# Patient Record
Sex: Female | Born: 1945 | Race: Black or African American | Hispanic: No | Marital: Married | Smoking: Former smoker
Health system: Southern US, Community
[De-identification: ages and names within clinical notes are randomized; demographics above are authoritative.]

## PROBLEM LIST (undated history)

## (undated) DIAGNOSIS — E785 Hyperlipidemia, unspecified: Secondary | ICD-10-CM

## (undated) DIAGNOSIS — I10 Essential (primary) hypertension: Secondary | ICD-10-CM

## (undated) DIAGNOSIS — R918 Other nonspecific abnormal finding of lung field: Secondary | ICD-10-CM

## (undated) HISTORY — DX: Essential (primary) hypertension: I10

## (undated) HISTORY — DX: Hyperlipidemia, unspecified: E78.5

## (undated) HISTORY — PX: VAGINAL HYSTERECTOMY: SUR661

---

## 1998-02-08 ENCOUNTER — Ambulatory Visit (HOSPITAL_COMMUNITY): Admission: RE | Admit: 1998-02-08 | Discharge: 1998-02-08 | Payer: Self-pay | Admitting: Family Medicine

## 1998-02-08 ENCOUNTER — Encounter: Payer: Self-pay | Admitting: Family Medicine

## 1999-02-27 ENCOUNTER — Other Ambulatory Visit: Admission: RE | Admit: 1999-02-27 | Discharge: 1999-02-27 | Payer: Self-pay | Admitting: Family Medicine

## 1999-03-08 ENCOUNTER — Ambulatory Visit (HOSPITAL_COMMUNITY): Admission: RE | Admit: 1999-03-08 | Discharge: 1999-03-08 | Payer: Self-pay | Admitting: Family Medicine

## 1999-03-08 ENCOUNTER — Encounter: Payer: Self-pay | Admitting: Family Medicine

## 2000-02-11 ENCOUNTER — Encounter: Payer: Self-pay | Admitting: Family Medicine

## 2000-02-11 ENCOUNTER — Encounter: Admission: RE | Admit: 2000-02-11 | Discharge: 2000-02-11 | Payer: Self-pay | Admitting: Family Medicine

## 2001-02-10 ENCOUNTER — Encounter: Admission: RE | Admit: 2001-02-10 | Discharge: 2001-02-10 | Payer: Self-pay | Admitting: Family Medicine

## 2001-02-10 ENCOUNTER — Encounter: Payer: Self-pay | Admitting: Family Medicine

## 2002-02-16 ENCOUNTER — Encounter: Payer: Self-pay | Admitting: Family Medicine

## 2002-02-16 ENCOUNTER — Encounter: Admission: RE | Admit: 2002-02-16 | Discharge: 2002-02-16 | Payer: Self-pay | Admitting: Family Medicine

## 2002-04-06 ENCOUNTER — Encounter: Admission: RE | Admit: 2002-04-06 | Discharge: 2002-05-10 | Payer: Self-pay | Admitting: Occupational Medicine

## 2002-06-08 ENCOUNTER — Encounter: Admission: RE | Admit: 2002-06-08 | Discharge: 2002-09-06 | Payer: Self-pay | Admitting: Orthopedic Surgery

## 2002-12-28 ENCOUNTER — Ambulatory Visit (HOSPITAL_COMMUNITY): Admission: RE | Admit: 2002-12-28 | Discharge: 2002-12-28 | Payer: Self-pay | Admitting: Orthopedic Surgery

## 2002-12-28 ENCOUNTER — Encounter: Payer: Self-pay | Admitting: Orthopedic Surgery

## 2003-02-18 ENCOUNTER — Encounter: Payer: Self-pay | Admitting: Family Medicine

## 2003-02-18 ENCOUNTER — Encounter: Admission: RE | Admit: 2003-02-18 | Discharge: 2003-02-18 | Payer: Self-pay | Admitting: Family Medicine

## 2003-03-17 ENCOUNTER — Encounter: Payer: Self-pay | Admitting: Orthopedic Surgery

## 2003-03-24 ENCOUNTER — Ambulatory Visit (HOSPITAL_COMMUNITY): Admission: RE | Admit: 2003-03-24 | Discharge: 2003-03-24 | Payer: Self-pay | Admitting: Orthopedic Surgery

## 2003-05-06 ENCOUNTER — Encounter: Admission: RE | Admit: 2003-05-06 | Discharge: 2003-05-19 | Payer: Self-pay | Admitting: Orthopedic Surgery

## 2004-01-10 ENCOUNTER — Encounter: Admission: RE | Admit: 2004-01-10 | Discharge: 2004-01-10 | Payer: Self-pay | Admitting: Family Medicine

## 2004-02-20 ENCOUNTER — Encounter: Admission: RE | Admit: 2004-02-20 | Discharge: 2004-02-20 | Payer: Self-pay | Admitting: Family Medicine

## 2004-02-28 ENCOUNTER — Other Ambulatory Visit: Admission: RE | Admit: 2004-02-28 | Discharge: 2004-02-28 | Payer: Self-pay | Admitting: Family Medicine

## 2004-02-29 ENCOUNTER — Ambulatory Visit (HOSPITAL_COMMUNITY): Admission: RE | Admit: 2004-02-29 | Discharge: 2004-02-29 | Payer: Self-pay | Admitting: Family Medicine

## 2004-04-13 ENCOUNTER — Encounter (INDEPENDENT_AMBULATORY_CARE_PROVIDER_SITE_OTHER): Payer: Self-pay | Admitting: *Deleted

## 2004-04-13 ENCOUNTER — Ambulatory Visit (HOSPITAL_COMMUNITY): Admission: RE | Admit: 2004-04-13 | Discharge: 2004-04-13 | Payer: Self-pay | Admitting: Gastroenterology

## 2005-02-21 ENCOUNTER — Encounter: Admission: RE | Admit: 2005-02-21 | Discharge: 2005-02-21 | Payer: Self-pay | Admitting: Family Medicine

## 2006-02-25 ENCOUNTER — Encounter: Admission: RE | Admit: 2006-02-25 | Discharge: 2006-02-25 | Payer: Self-pay | Admitting: Family Medicine

## 2006-03-04 ENCOUNTER — Other Ambulatory Visit: Admission: RE | Admit: 2006-03-04 | Discharge: 2006-03-04 | Payer: Self-pay | Admitting: Family Medicine

## 2007-02-27 ENCOUNTER — Encounter: Admission: RE | Admit: 2007-02-27 | Discharge: 2007-02-27 | Payer: Self-pay | Admitting: Family Medicine

## 2008-02-29 ENCOUNTER — Encounter: Admission: RE | Admit: 2008-02-29 | Discharge: 2008-02-29 | Payer: Self-pay | Admitting: Family Medicine

## 2008-03-08 ENCOUNTER — Other Ambulatory Visit: Admission: RE | Admit: 2008-03-08 | Discharge: 2008-03-08 | Payer: Self-pay | Admitting: Family Medicine

## 2008-03-18 ENCOUNTER — Encounter: Admission: RE | Admit: 2008-03-18 | Discharge: 2008-03-18 | Payer: Self-pay | Admitting: Family Medicine

## 2009-03-01 ENCOUNTER — Encounter: Admission: RE | Admit: 2009-03-01 | Discharge: 2009-03-01 | Payer: Self-pay | Admitting: Family Medicine

## 2009-05-04 ENCOUNTER — Other Ambulatory Visit: Admission: RE | Admit: 2009-05-04 | Discharge: 2009-05-04 | Payer: Self-pay | Admitting: Family Medicine

## 2010-03-02 ENCOUNTER — Encounter: Admission: RE | Admit: 2010-03-02 | Discharge: 2010-03-02 | Payer: Self-pay | Admitting: Family Medicine

## 2010-06-17 ENCOUNTER — Encounter: Payer: Self-pay | Admitting: Endocrinology

## 2010-10-12 NOTE — Op Note (Signed)
Yolanda Robertson, Yolanda Robertson           ACCOUNT NO.:  [id number]   MEDICAL RECORD NO.:  [id number]          PATIENT TYPE:  AMB   LOCATION:  ENDO                         FACILITY:  [facility]   PHYSICIAN:  Anselmo Rod, M.D.  DATE OF BIRTH:  28-Sep-1945   DATE OF PROCEDURE:  04/13/2004  DATE OF DISCHARGE:                                 OPERATIVE REPORT   PROCEDURE PERFORMED:  Colonoscopy with snare polypectomy x4 and cold  biopsies x4.   ENDOSCOPIST:  Anselmo Rod, M.D.   INSTRUMENT USED:  Olympus video colonoscope.   INDICATION FOR PROCEDURE:  A 65 year old African-American female undergoing  a screening colonoscopy to rule out colonic polyps, masses, etc.   PREPROCEDURE PHYSICAL:  Informed consent was procured from the patient.  The  patient was fasted for eight hours prior to the procedure and prepped with a  bottle of magnesium citrate and a gallon of GoLYTELY the night prior to the  procedure.  The risks and benefits of the procedure, including a 10% miss  rate of colon polyps and cancers, was discussed with the patient as well.   PREPROCEDURE PHYSICAL:  VITAL SIGNS:  The patient had stable vital signs.  NECK:  Supple.  CHEST:  Clear to auscultation.  S1, S2 regular.  ABDOMEN:  Soft with normal bowel sounds.   DESCRIPTION OF PROCEDURE:  The patient was placed in the left lateral  decubitus position and sedated with 75 mg of Demerol and 8 mg of Versed in  slow incremental doses.  Once the patient was adequately sedate and  maintained on low-flow oxygen and continuous cardiac monitoring, the Olympus  video colonoscope was advanced from the rectum to the cecum.  The  appendiceal orifice and ileocecal valve were clearly visualized and  photographed.  Four small sessile polyps were cold biopsied from the  rectosigmoid colon and four flat polyps were removed by snare polypectomy.  The rest of the exam was unrevealing.  Retroflexion in th rectum revealed no  abnormalities.   IMPRESSION:  1.  Four small sessile polyps cold biopsied from rectosigmoid colon and four      polyps (flat polyps) snared from this area as well.  2.  No other masses or polyps seen, no evidence of diverticulosis.   RECOMMENDATIONS:  1.  Await pathology results.  2.  Avoid nonsteroidals, including aspirin, for the next four weeks.  3.  Outpatient follow-up in the next two weeks or earlier if need be.       JNM/MEDQ  D:  04/13/2004  T:  04/14/2004  Job:  [id number]   cc:   [name], M.D.  [phone] [address], [address]  [place]  [place] [address]  Fax: [phone]

## 2010-10-12 NOTE — Op Note (Signed)
NAME:  Yolanda Robertson, Yolanda Robertson                     ACCOUNT NO.:  [id number]   MEDICAL RECORD NO.:  [id number]                   PATIENT TYPE:  AMB   LOCATION:  DAY                                  [facility]ACILITY:  [facility]   PHYSICIAN:  Dionne Ano. Everlene Other, M.D.         DATE OF BIRTH:  1946/04/01   DATE OF PROCEDURE:  03/24/2003  DATE OF DISCHARGE:                                 OPERATIVE REPORT   PREOPERATIVE DIAGNOSES:  Painful left carpometacarpal boss with  tenosynovitis about the extensor carpi radialis longus tendon and minor boss  which is minimally symptomatic about the third carpometacarpal region.   POSTOPERATIVE DIAGNOSES:  Painful left carpometacarpal boss with  tenosynovitis about the extensor carpi radialis longus tendon and minor boss  which is minimally symptomatic about the third carpometacarpal region.   PROCEDURE:  1. Right second CMC boss removal (debridement of bony spur CMC     joint/removal).  2. Extensor carpi radialis longus tenosynovectomy at the wrist level.   SURGEON:  Dionne Ano. Amanda Pea, M.D.   ASSISTANT:  Karie Chimera, P.A.-C.   COMPLICATIONS:  None.   ANESTHESIA:  Axillary block with IV sedation.   TOURNIQUET TIME:  Less than an hour.   ESTIMATED BLOOD LOSS:  Minimal.   DRAINS:  None.   INDICATIONS FOR PROCEDURE:  This patient is a very pleasant 65 year old  female whose had a history of de Quervain tenosynovitis as well as other  hand complaints including the above mentioned CMC painful boss. I have  counselled her in regards to the risks and benefits of surgery including  risk of infection, bleeding, anesthesia, damage to normal structures and  failure of the surgery to accomplish its intended goals of relieving  symptoms and restoring function. with this in mind, she desires to proceed.  She has failed conservative management and desires to proceed. I have  discussed with her all do's and don't's and I do feel this is related to her  work. We  have discussed these issues at length.   FINDINGS:  The patient had a very large CMC boss impinging against the ECRL  tendon. She underwent a tenosynovectomy of the ECRL tendon and bony boss  removal without difficulty. The ECRB tendon was identified and did not have  significant tenosynovitic changes.   DESCRIPTION OF PROCEDURE:  The patient was seen by myself and anesthesia,  taken to the operative suite. Axillary block was noted to be in excellent  working fashion. She was then given a light IV sedation. She was laid  supine, appropriately padded, prepped and draped in the usual sterile  fashion with Betadine scrub and paint. I performed an evaluation under  anesthesia, she had excellent stability. There was no obvious instability  noted. Once this was done and a sterile field was secured, a transverse  incision was made under 250 mm of tourniquet control at the level of the [facility]  region. Once the incision was made, I bluntly  dissected after the initial  skin incision to the second [facility] region. There was a dorsal crossing fascial  bundle and I did sweep the dorsal sensory branches radially. I dissected  down to the [facility] boss region, incised the periosteal tissue. At this time, I  identified the ECRL tendon and performed a tenosynovectomy where there was a  large amount of tenosynovitis secondary to its rubbing over the boss. I then  gently swept this out of the way, identified the boss circumferentially and  removed it with osteotome rongeur and filed this down with a file to a nice  smooth surface. I then placed bone wax on it. I did inspect the [facility] joint  where the trapezoid and second metacarpal met each other and this showed no  significant arthritic changes in the deep portion. With this noted, I then  irrigated the area copiously. Following this, I looked at the ECRB tendon at  its insertion onto the third [facility] region and there was no advanced  tenosynovitic changes and this  correlated with her preoperative examination  thus at this time I did not perform any aggressive third CMC take down.  Following this, I deflated the tourniquet, obtained hemostasis with bipolar  electrocautery, irrigated the wound copiously and then closed it with one  interrupted Vicryl followed by a subcuticular Prolene suture, Steri-Strips  and gauze followed by a volar plaster splint with the wrist in full  extension. She tolerated the procedure well, there were no complicating  features. All sponge, needle and instrument counts were reported as correct.  She will be transferred to the recovery room, discharged home on appropriate  pain medicines of  Percocet, Robaxin and Colace. Will see her back in the  office in 8-10 days for suture removal and proceed with her postoperative  protocol including casting for up to four weeks postop. She did quite well  with her surgery and I was pleased with all findings. There were no  complicating features with her surgery.                                               Dionne Ano. Everlene Other, M.D.    Nash Mantis  D:  03/24/2003  T:  03/24/2003  Job:  [id number]

## 2011-01-30 ENCOUNTER — Other Ambulatory Visit: Payer: Self-pay | Admitting: Family Medicine

## 2011-01-30 DIAGNOSIS — Z1231 Encounter for screening mammogram for malignant neoplasm of breast: Secondary | ICD-10-CM

## 2011-03-04 ENCOUNTER — Ambulatory Visit
Admission: RE | Admit: 2011-03-04 | Discharge: 2011-03-04 | Disposition: A | Discharge status: Home / Self Care | Payer: 59 | Source: Ambulatory Visit | Attending: Family Medicine | Admitting: Family Medicine

## 2011-03-04 DIAGNOSIS — Z1231 Encounter for screening mammogram for malignant neoplasm of breast: Secondary | ICD-10-CM

## 2012-02-03 ENCOUNTER — Other Ambulatory Visit: Payer: Self-pay | Admitting: Family Medicine

## 2012-02-03 DIAGNOSIS — Z1231 Encounter for screening mammogram for malignant neoplasm of breast: Secondary | ICD-10-CM

## 2012-03-05 ENCOUNTER — Ambulatory Visit
Admission: RE | Admit: 2012-03-05 | Discharge: 2012-03-05 | Disposition: A | Discharge status: Home / Self Care | Payer: Medicare Other | Source: Ambulatory Visit | Attending: Family Medicine | Admitting: Family Medicine

## 2012-03-05 DIAGNOSIS — Z1231 Encounter for screening mammogram for malignant neoplasm of breast: Secondary | ICD-10-CM

## 2013-02-01 ENCOUNTER — Other Ambulatory Visit: Payer: Self-pay

## 2013-02-01 DIAGNOSIS — Z1231 Encounter for screening mammogram for malignant neoplasm of breast: Secondary | ICD-10-CM

## 2013-03-08 ENCOUNTER — Ambulatory Visit
Admission: RE | Admit: 2013-03-08 | Discharge: 2013-03-08 | Disposition: A | Discharge status: Home / Self Care | Payer: Medicare Other | Source: Ambulatory Visit

## 2013-03-08 DIAGNOSIS — Z1231 Encounter for screening mammogram for malignant neoplasm of breast: Secondary | ICD-10-CM

## 2014-02-02 ENCOUNTER — Other Ambulatory Visit: Payer: Self-pay

## 2014-02-02 DIAGNOSIS — Z1231 Encounter for screening mammogram for malignant neoplasm of breast: Secondary | ICD-10-CM

## 2014-03-09 ENCOUNTER — Encounter (INDEPENDENT_AMBULATORY_CARE_PROVIDER_SITE_OTHER): Payer: Self-pay

## 2014-03-09 ENCOUNTER — Ambulatory Visit
Admission: RE | Admit: 2014-03-09 | Discharge: 2014-03-09 | Disposition: A | Discharge status: Home / Self Care | Payer: Commercial Managed Care - HMO | Source: Ambulatory Visit

## 2014-03-09 DIAGNOSIS — Z1231 Encounter for screening mammogram for malignant neoplasm of breast: Secondary | ICD-10-CM

## 2014-07-20 DIAGNOSIS — E1100 Type 2 diabetes mellitus with hyperosmolarity without nonketotic hyperglycemic-hyperosmolar coma (NKHHC): Secondary | ICD-10-CM | POA: Diagnosis not present

## 2014-07-20 DIAGNOSIS — I10 Essential (primary) hypertension: Secondary | ICD-10-CM | POA: Diagnosis not present

## 2014-07-26 DIAGNOSIS — I10 Essential (primary) hypertension: Secondary | ICD-10-CM | POA: Diagnosis not present

## 2014-07-26 DIAGNOSIS — E782 Mixed hyperlipidemia: Secondary | ICD-10-CM | POA: Diagnosis not present

## 2014-07-26 DIAGNOSIS — E0800 Diabetes mellitus due to underlying condition with hyperosmolarity without nonketotic hyperglycemic-hyperosmolar coma (NKHHC): Secondary | ICD-10-CM | POA: Diagnosis not present

## 2014-12-27 DIAGNOSIS — M130 Polyarthritis, unspecified: Secondary | ICD-10-CM | POA: Diagnosis not present

## 2014-12-27 DIAGNOSIS — E782 Mixed hyperlipidemia: Secondary | ICD-10-CM | POA: Diagnosis not present

## 2014-12-27 DIAGNOSIS — I10 Essential (primary) hypertension: Secondary | ICD-10-CM | POA: Diagnosis not present

## 2014-12-27 DIAGNOSIS — E0800 Diabetes mellitus due to underlying condition with hyperosmolarity without nonketotic hyperglycemic-hyperosmolar coma (NKHHC): Secondary | ICD-10-CM | POA: Diagnosis not present

## 2015-02-09 ENCOUNTER — Other Ambulatory Visit: Payer: Self-pay

## 2015-02-09 DIAGNOSIS — Z1231 Encounter for screening mammogram for malignant neoplasm of breast: Secondary | ICD-10-CM

## 2015-03-15 ENCOUNTER — Ambulatory Visit
Admission: RE | Admit: 2015-03-15 | Discharge: 2015-03-15 | Disposition: A | Discharge status: Home / Self Care | Payer: Commercial Managed Care - HMO | Source: Ambulatory Visit

## 2015-03-15 DIAGNOSIS — Z1231 Encounter for screening mammogram for malignant neoplasm of breast: Secondary | ICD-10-CM

## 2015-05-17 DIAGNOSIS — E118 Type 2 diabetes mellitus with unspecified complications: Secondary | ICD-10-CM | POA: Diagnosis not present

## 2015-05-17 DIAGNOSIS — I10 Essential (primary) hypertension: Secondary | ICD-10-CM | POA: Diagnosis not present

## 2015-05-17 DIAGNOSIS — E782 Mixed hyperlipidemia: Secondary | ICD-10-CM | POA: Diagnosis not present

## 2015-05-18 DIAGNOSIS — M130 Polyarthritis, unspecified: Secondary | ICD-10-CM | POA: Diagnosis not present

## 2015-05-18 DIAGNOSIS — I10 Essential (primary) hypertension: Secondary | ICD-10-CM | POA: Diagnosis not present

## 2015-05-18 DIAGNOSIS — E0800 Diabetes mellitus due to underlying condition with hyperosmolarity without nonketotic hyperglycemic-hyperosmolar coma (NKHHC): Secondary | ICD-10-CM | POA: Diagnosis not present

## 2015-09-15 DIAGNOSIS — I10 Essential (primary) hypertension: Secondary | ICD-10-CM | POA: Diagnosis not present

## 2015-09-15 DIAGNOSIS — E089 Diabetes mellitus due to underlying condition without complications: Secondary | ICD-10-CM | POA: Diagnosis not present

## 2015-09-15 DIAGNOSIS — R7309 Other abnormal glucose: Secondary | ICD-10-CM | POA: Diagnosis not present

## 2016-01-17 DIAGNOSIS — E7800 Pure hypercholesterolemia, unspecified: Secondary | ICD-10-CM | POA: Diagnosis not present

## 2016-01-17 DIAGNOSIS — I10 Essential (primary) hypertension: Secondary | ICD-10-CM | POA: Diagnosis not present

## 2016-01-18 DIAGNOSIS — Z0000 Encounter for general adult medical examination without abnormal findings: Secondary | ICD-10-CM | POA: Diagnosis not present

## 2016-01-18 DIAGNOSIS — I10 Essential (primary) hypertension: Secondary | ICD-10-CM | POA: Diagnosis not present

## 2016-01-18 DIAGNOSIS — M130 Polyarthritis, unspecified: Secondary | ICD-10-CM | POA: Diagnosis not present

## 2016-01-24 DIAGNOSIS — M85852 Other specified disorders of bone density and structure, left thigh: Secondary | ICD-10-CM | POA: Diagnosis not present

## 2016-01-24 DIAGNOSIS — Z780 Asymptomatic menopausal state: Secondary | ICD-10-CM | POA: Diagnosis not present

## 2016-02-12 ENCOUNTER — Other Ambulatory Visit: Payer: Self-pay | Admitting: Family Medicine

## 2016-02-12 DIAGNOSIS — Z1231 Encounter for screening mammogram for malignant neoplasm of breast: Secondary | ICD-10-CM

## 2016-02-13 DIAGNOSIS — Z23 Encounter for immunization: Secondary | ICD-10-CM | POA: Diagnosis not present

## 2016-03-15 ENCOUNTER — Ambulatory Visit
Admission: RE | Admit: 2016-03-15 | Discharge: 2016-03-15 | Disposition: A | Discharge status: Home / Self Care | Payer: Commercial Managed Care - HMO | Source: Ambulatory Visit | Attending: Family Medicine | Admitting: Family Medicine

## 2016-03-15 DIAGNOSIS — Z1231 Encounter for screening mammogram for malignant neoplasm of breast: Secondary | ICD-10-CM

## 2016-06-18 DIAGNOSIS — I10 Essential (primary) hypertension: Secondary | ICD-10-CM | POA: Diagnosis not present

## 2016-06-18 DIAGNOSIS — E119 Type 2 diabetes mellitus without complications: Secondary | ICD-10-CM | POA: Diagnosis not present

## 2016-06-18 DIAGNOSIS — E782 Mixed hyperlipidemia: Secondary | ICD-10-CM | POA: Diagnosis not present

## 2016-06-19 DIAGNOSIS — E782 Mixed hyperlipidemia: Secondary | ICD-10-CM | POA: Diagnosis not present

## 2016-06-19 DIAGNOSIS — I10 Essential (primary) hypertension: Secondary | ICD-10-CM | POA: Diagnosis not present

## 2017-01-07 DIAGNOSIS — E782 Mixed hyperlipidemia: Secondary | ICD-10-CM | POA: Diagnosis not present

## 2017-01-07 DIAGNOSIS — M130 Polyarthritis, unspecified: Secondary | ICD-10-CM | POA: Diagnosis not present

## 2017-01-07 DIAGNOSIS — Z91120 Patient's intentional underdosing of medication regimen due to financial hardship: Secondary | ICD-10-CM | POA: Diagnosis not present

## 2017-01-07 DIAGNOSIS — I10 Essential (primary) hypertension: Secondary | ICD-10-CM | POA: Diagnosis not present

## 2017-02-10 ENCOUNTER — Other Ambulatory Visit: Payer: Self-pay | Admitting: Family Medicine

## 2017-02-10 DIAGNOSIS — Z1231 Encounter for screening mammogram for malignant neoplasm of breast: Secondary | ICD-10-CM

## 2017-02-19 DIAGNOSIS — Z23 Encounter for immunization: Secondary | ICD-10-CM | POA: Diagnosis not present

## 2017-03-17 ENCOUNTER — Ambulatory Visit
Admission: RE | Admit: 2017-03-17 | Discharge: 2017-03-17 | Disposition: A | Discharge status: Home / Self Care | Payer: Commercial Managed Care - HMO | Source: Ambulatory Visit | Attending: Family Medicine | Admitting: Family Medicine

## 2017-03-17 DIAGNOSIS — Z1231 Encounter for screening mammogram for malignant neoplasm of breast: Secondary | ICD-10-CM

## 2017-05-13 DIAGNOSIS — E782 Mixed hyperlipidemia: Secondary | ICD-10-CM | POA: Diagnosis not present

## 2017-05-13 DIAGNOSIS — F064 Anxiety disorder due to known physiological condition: Secondary | ICD-10-CM | POA: Diagnosis not present

## 2017-05-13 DIAGNOSIS — I10 Essential (primary) hypertension: Secondary | ICD-10-CM | POA: Diagnosis not present

## 2017-05-13 DIAGNOSIS — E119 Type 2 diabetes mellitus without complications: Secondary | ICD-10-CM | POA: Diagnosis not present

## 2017-07-15 DIAGNOSIS — I10 Essential (primary) hypertension: Secondary | ICD-10-CM | POA: Diagnosis not present

## 2017-10-14 DIAGNOSIS — E782 Mixed hyperlipidemia: Secondary | ICD-10-CM | POA: Diagnosis not present

## 2017-10-14 DIAGNOSIS — I10 Essential (primary) hypertension: Secondary | ICD-10-CM | POA: Diagnosis not present

## 2017-10-14 DIAGNOSIS — M130 Polyarthritis, unspecified: Secondary | ICD-10-CM | POA: Diagnosis not present

## 2017-10-14 DIAGNOSIS — E1100 Type 2 diabetes mellitus with hyperosmolarity without nonketotic hyperglycemic-hyperosmolar coma (NKHHC): Secondary | ICD-10-CM | POA: Diagnosis not present

## 2018-02-05 ENCOUNTER — Other Ambulatory Visit: Payer: Self-pay | Admitting: Family Medicine

## 2018-02-05 DIAGNOSIS — Z1231 Encounter for screening mammogram for malignant neoplasm of breast: Secondary | ICD-10-CM

## 2018-02-10 DIAGNOSIS — M130 Polyarthritis, unspecified: Secondary | ICD-10-CM | POA: Diagnosis not present

## 2018-02-10 DIAGNOSIS — I10 Essential (primary) hypertension: Secondary | ICD-10-CM | POA: Diagnosis not present

## 2018-02-10 DIAGNOSIS — E1100 Type 2 diabetes mellitus with hyperosmolarity without nonketotic hyperglycemic-hyperosmolar coma (NKHHC): Secondary | ICD-10-CM | POA: Diagnosis not present

## 2018-02-10 DIAGNOSIS — E782 Mixed hyperlipidemia: Secondary | ICD-10-CM | POA: Diagnosis not present

## 2018-02-12 DIAGNOSIS — E782 Mixed hyperlipidemia: Secondary | ICD-10-CM | POA: Diagnosis not present

## 2018-02-12 DIAGNOSIS — Z0000 Encounter for general adult medical examination without abnormal findings: Secondary | ICD-10-CM | POA: Diagnosis not present

## 2018-02-12 DIAGNOSIS — I10 Essential (primary) hypertension: Secondary | ICD-10-CM | POA: Diagnosis not present

## 2018-02-12 DIAGNOSIS — Z23 Encounter for immunization: Secondary | ICD-10-CM | POA: Diagnosis not present

## 2018-03-04 DIAGNOSIS — I10 Essential (primary) hypertension: Secondary | ICD-10-CM | POA: Diagnosis not present

## 2018-03-19 ENCOUNTER — Ambulatory Visit: Payer: Commercial Managed Care - HMO

## 2018-04-28 ENCOUNTER — Ambulatory Visit
Admission: RE | Admit: 2018-04-28 | Discharge: 2018-04-28 | Disposition: A | Discharge status: Home / Self Care | Payer: Medicare HMO | Source: Ambulatory Visit | Attending: Family Medicine | Admitting: Family Medicine

## 2018-04-28 DIAGNOSIS — Z1231 Encounter for screening mammogram for malignant neoplasm of breast: Secondary | ICD-10-CM

## 2018-05-04 DIAGNOSIS — I10 Essential (primary) hypertension: Secondary | ICD-10-CM | POA: Diagnosis not present

## 2018-05-04 DIAGNOSIS — E782 Mixed hyperlipidemia: Secondary | ICD-10-CM | POA: Diagnosis not present

## 2018-05-04 DIAGNOSIS — E1100 Type 2 diabetes mellitus with hyperosmolarity without nonketotic hyperglycemic-hyperosmolar coma (NKHHC): Secondary | ICD-10-CM | POA: Diagnosis not present

## 2018-05-05 DIAGNOSIS — E1100 Type 2 diabetes mellitus with hyperosmolarity without nonketotic hyperglycemic-hyperosmolar coma (NKHHC): Secondary | ICD-10-CM | POA: Diagnosis not present

## 2018-05-05 DIAGNOSIS — E782 Mixed hyperlipidemia: Secondary | ICD-10-CM | POA: Diagnosis not present

## 2018-05-05 DIAGNOSIS — Z6832 Body mass index (BMI) 32.0-32.9, adult: Secondary | ICD-10-CM | POA: Diagnosis not present

## 2018-05-05 DIAGNOSIS — I10 Essential (primary) hypertension: Secondary | ICD-10-CM | POA: Diagnosis not present

## 2018-05-05 DIAGNOSIS — E089 Diabetes mellitus due to underlying condition without complications: Secondary | ICD-10-CM | POA: Diagnosis not present

## 2018-11-06 DIAGNOSIS — E089 Diabetes mellitus due to underlying condition without complications: Secondary | ICD-10-CM | POA: Diagnosis not present

## 2018-11-06 DIAGNOSIS — I10 Essential (primary) hypertension: Secondary | ICD-10-CM | POA: Diagnosis not present

## 2018-11-06 DIAGNOSIS — E782 Mixed hyperlipidemia: Secondary | ICD-10-CM | POA: Diagnosis not present

## 2018-11-06 DIAGNOSIS — E119 Type 2 diabetes mellitus without complications: Secondary | ICD-10-CM | POA: Diagnosis not present

## 2018-11-06 DIAGNOSIS — E785 Hyperlipidemia, unspecified: Secondary | ICD-10-CM | POA: Diagnosis not present

## 2018-11-10 DIAGNOSIS — E1169 Type 2 diabetes mellitus with other specified complication: Secondary | ICD-10-CM | POA: Diagnosis not present

## 2018-11-10 DIAGNOSIS — I10 Essential (primary) hypertension: Secondary | ICD-10-CM | POA: Diagnosis not present

## 2019-03-10 DIAGNOSIS — E785 Hyperlipidemia, unspecified: Secondary | ICD-10-CM | POA: Diagnosis not present

## 2019-03-10 DIAGNOSIS — E1169 Type 2 diabetes mellitus with other specified complication: Secondary | ICD-10-CM | POA: Diagnosis not present

## 2019-03-10 DIAGNOSIS — I10 Essential (primary) hypertension: Secondary | ICD-10-CM | POA: Diagnosis not present

## 2019-03-12 DIAGNOSIS — I10 Essential (primary) hypertension: Secondary | ICD-10-CM | POA: Diagnosis not present

## 2019-03-12 DIAGNOSIS — Z0000 Encounter for general adult medical examination without abnormal findings: Secondary | ICD-10-CM | POA: Diagnosis not present

## 2019-03-12 DIAGNOSIS — E1169 Type 2 diabetes mellitus with other specified complication: Secondary | ICD-10-CM | POA: Diagnosis not present

## 2019-03-30 ENCOUNTER — Other Ambulatory Visit: Payer: Self-pay | Admitting: Family Medicine

## 2019-03-30 DIAGNOSIS — Z1231 Encounter for screening mammogram for malignant neoplasm of breast: Secondary | ICD-10-CM

## 2019-05-19 ENCOUNTER — Other Ambulatory Visit: Payer: Self-pay

## 2019-05-19 ENCOUNTER — Ambulatory Visit
Admission: RE | Admit: 2019-05-19 | Discharge: 2019-05-19 | Disposition: A | Discharge status: Home / Self Care | Payer: Medicare Other | Source: Ambulatory Visit | Attending: Family Medicine | Admitting: Family Medicine

## 2019-05-19 DIAGNOSIS — Z1231 Encounter for screening mammogram for malignant neoplasm of breast: Secondary | ICD-10-CM | POA: Diagnosis not present

## 2019-07-12 DIAGNOSIS — I10 Essential (primary) hypertension: Secondary | ICD-10-CM | POA: Diagnosis not present

## 2019-07-12 DIAGNOSIS — E782 Mixed hyperlipidemia: Secondary | ICD-10-CM | POA: Diagnosis not present

## 2019-07-12 DIAGNOSIS — R799 Abnormal finding of blood chemistry, unspecified: Secondary | ICD-10-CM | POA: Diagnosis not present

## 2019-07-12 DIAGNOSIS — E785 Hyperlipidemia, unspecified: Secondary | ICD-10-CM | POA: Diagnosis not present

## 2019-07-12 DIAGNOSIS — E119 Type 2 diabetes mellitus without complications: Secondary | ICD-10-CM | POA: Diagnosis not present

## 2019-07-13 DIAGNOSIS — E782 Mixed hyperlipidemia: Secondary | ICD-10-CM | POA: Diagnosis not present

## 2019-07-13 DIAGNOSIS — E1169 Type 2 diabetes mellitus with other specified complication: Secondary | ICD-10-CM | POA: Diagnosis not present

## 2019-07-13 DIAGNOSIS — I10 Essential (primary) hypertension: Secondary | ICD-10-CM | POA: Diagnosis not present

## 2019-07-13 DIAGNOSIS — M130 Polyarthritis, unspecified: Secondary | ICD-10-CM | POA: Diagnosis not present

## 2019-07-29 ENCOUNTER — Ambulatory Visit: Payer: Medicare Other | Attending: Internal Medicine

## 2019-07-29 DIAGNOSIS — Z23 Encounter for immunization: Secondary | ICD-10-CM | POA: Insufficient documentation

## 2019-07-29 NOTE — Progress Notes (Signed)
   Covid-19 Vaccination Clinic  Name:  Yolanda Robertson    MRN: [id number] DOB: Sep 28, 1945  07/29/2019  [name] was observed post Covid-19 immunization for 15 minutes without incident. She was provided with Vaccine Information Sheet and instruction to access the V-Safe system.   [name] was instructed to call 911 with any severe reactions post vaccine: [name] Difficulty breathing  . Swelling of face and throat  . A fast heartbeat  . A bad rash all over body  . Dizziness and weakness   Immunizations Administered    Name Date Dose VIS Date Route   Pfizer COVID-19 Vaccine 07/29/2019  9:29 AM 0.3 mL 05/07/2019 Intramuscular   Manufacturer: [place]   Lot: [id number]   Louisa: [id number]

## 2019-08-24 ENCOUNTER — Ambulatory Visit: Payer: Medicare Other | Attending: Internal Medicine

## 2019-08-24 DIAGNOSIS — Z23 Encounter for immunization: Secondary | ICD-10-CM

## 2019-08-24 NOTE — Progress Notes (Signed)
   Covid-19 Vaccination Clinic  Name:  Yolanda Robertson    MRN: [id number] DOB: 04-28-1946  08/24/2019  [name] was observed post Covid-19 immunization for 15 minutes without incident. She was provided with Vaccine Information Sheet and instruction to access the V-Safe system.   [name] was instructed to call 911 with any severe reactions post vaccine: [name] Difficulty breathing  . Swelling of face and throat  . A fast heartbeat  . A bad rash all over body  . Dizziness and weakness   Immunizations Administered    Name Date Dose VIS Date Route   Pfizer COVID-19 Vaccine 08/24/2019  1:38 PM 0.3 mL 05/07/2019 Intramuscular   Manufacturer: [employer]   Lot: [id number]   [place]ng: [id number]

## 2019-09-20 DIAGNOSIS — Z7689 Persons encountering health services in other specified circumstances: Secondary | ICD-10-CM | POA: Diagnosis not present

## 2019-09-24 DIAGNOSIS — E1100 Type 2 diabetes mellitus with hyperosmolarity without nonketotic hyperglycemic-hyperosmolar coma (NKHHC): Secondary | ICD-10-CM | POA: Diagnosis not present

## 2019-09-24 DIAGNOSIS — M130 Polyarthritis, unspecified: Secondary | ICD-10-CM | POA: Diagnosis not present

## 2019-09-24 DIAGNOSIS — E7849 Other hyperlipidemia: Secondary | ICD-10-CM | POA: Diagnosis not present

## 2019-09-24 DIAGNOSIS — I10 Essential (primary) hypertension: Secondary | ICD-10-CM | POA: Diagnosis not present

## 2019-10-08 DIAGNOSIS — E785 Hyperlipidemia, unspecified: Secondary | ICD-10-CM | POA: Diagnosis not present

## 2019-10-08 DIAGNOSIS — E782 Mixed hyperlipidemia: Secondary | ICD-10-CM | POA: Diagnosis not present

## 2019-10-08 DIAGNOSIS — I10 Essential (primary) hypertension: Secondary | ICD-10-CM | POA: Diagnosis not present

## 2019-10-08 DIAGNOSIS — R7309 Other abnormal glucose: Secondary | ICD-10-CM | POA: Diagnosis not present

## 2019-10-11 DIAGNOSIS — M130 Polyarthritis, unspecified: Secondary | ICD-10-CM | POA: Diagnosis not present

## 2019-10-11 DIAGNOSIS — E782 Mixed hyperlipidemia: Secondary | ICD-10-CM | POA: Diagnosis not present

## 2019-10-11 DIAGNOSIS — E1169 Type 2 diabetes mellitus with other specified complication: Secondary | ICD-10-CM | POA: Diagnosis not present

## 2019-10-11 DIAGNOSIS — I10 Essential (primary) hypertension: Secondary | ICD-10-CM | POA: Diagnosis not present

## 2019-11-08 ENCOUNTER — Other Ambulatory Visit: Payer: Self-pay | Admitting: *Deleted

## 2019-11-08 DIAGNOSIS — M25562 Pain in left knee: Secondary | ICD-10-CM

## 2019-11-10 ENCOUNTER — Ambulatory Visit (HOSPITAL_COMMUNITY)
Admission: RE | Admit: 2019-11-10 | Discharge: 2019-11-10 | Disposition: A | Discharge status: Home / Self Care | Payer: Medicare Other | Source: Ambulatory Visit | Attending: Vascular Surgery | Admitting: Vascular Surgery

## 2019-11-10 ENCOUNTER — Ambulatory Visit: Payer: Medicare Other | Admitting: Vascular Surgery

## 2019-11-10 ENCOUNTER — Encounter: Payer: Self-pay | Admitting: Vascular Surgery

## 2019-11-10 ENCOUNTER — Other Ambulatory Visit: Payer: Self-pay

## 2019-11-10 VITALS — BP 120/78 | HR 81 | Temp 97.6°F | Resp 20 | Ht 62.0 in | Wt 170.7 lb

## 2019-11-10 DIAGNOSIS — M25562 Pain in left knee: Secondary | ICD-10-CM | POA: Insufficient documentation

## 2019-11-10 DIAGNOSIS — M25561 Pain in right knee: Secondary | ICD-10-CM

## 2019-11-10 DIAGNOSIS — M51369 Other intervertebral disc degeneration, lumbar region without mention of lumbar back pain or lower extremity pain: Secondary | ICD-10-CM

## 2019-11-10 DIAGNOSIS — M5136 Other intervertebral disc degeneration, lumbar region: Secondary | ICD-10-CM

## 2019-11-10 NOTE — Progress Notes (Signed)
REASON FOR CONSULT:    Left leg pain the consult is requested by Dr.Veita Criss Rosales.  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  She has pain in her back and pain radiating down the posterior aspect of her left leg and calf.  The symptoms occur with standing sitting and when lying flat which would all be more consistent with neurogenic pain.  I do not get any clear-cut history of claudication or rest pain.  She has palpable pedal pulses and a normal arterial Doppler study today.  I reassured her that she has excellent arterial flow.  We did discuss the importance of tobacco cessation.  We have also discussed the importance of exercise and nutrition.  Likewise I do not think she has any evidence of significant venous disease.  I will be happy to see her back in any time if any new vascular issues arise.  Deitra Mayo, MD Office: [phone]   HPI:   Yolanda Robertson is a pleasant 74 y.o. female, who was referred with left leg pain.  I have reviewed the records from the referring office.  The patient was seen on 07/13/2019 for a hypertension checkup.  The patient was having some leg pain on the left and also cold feet.  The patient was therefore set up for vascular consultation.  On my history the patient describes the gradual onset of pain in her left leg in December 2020.  She also began having back pain about that time.  She describes pain along the posterior aspect of her left leg which radiates down to her calf.  The symptoms occur with standing and sitting and also when she is lying flat.  Her symptoms are not aggravated by ambulation.  I do not get any history of claudication or rest pain.  She is had no history of nonhealing wounds.  She denies any history of DVT or phlebitis.  She is had no significant leg swelling.  Her risk factors for peripheral vascular disease include hypertension,  hypercholesterolemia, and tobacco use.  She smokes 1 pack/day of cigarettes and has been smoking for 30 years.  She denies any history of diabetes or family history of premature cardiovascular disease.  Past Medical History:  Diagnosis Date  . Hyperlipidemia   . Hypertension     History reviewed. No pertinent family history.  SOCIAL HISTORY: Social History   Socioeconomic History  . Marital status: Married    Spouse name: Not on file  . Number of children: 4  . Years of education: Not on file  . Highest education level: Not on file  Occupational History  . Not on file  Tobacco Use  . Smoking status: Current Every Day Smoker    Packs/day: 1.00    Types: Cigarettes  . Smokeless tobacco: Never Used  Vaping Use  . Vaping Use: Never used  Substance and Sexual Activity  . Alcohol use: Never  . Drug use: Never  . Sexual activity: Yes  Other Topics Concern  . Not on file  Social History Narrative  . Not on file   Social Determinants of Health   Financial Resource Strain:   . Difficulty of Paying Living Expenses:   Food Insecurity:   . Worried About Charity fundraiser in the Last Year:   . Arboriculturist in the Last Year:   Transportation Needs:   .  Lack of Transportation (Medical):   [name] Lack of Transportation (Non-Medical):   Physical Activity:   . Days of Exercise per Week:   . Minutes of Exercise per Session:   Stress:   . Feeling of Stress :   Social Connections:   . Frequency of Communication with Friends and Family:   . Frequency of Social Gatherings with Friends and Family:   . Attends Religious Services:   . Active Member of Clubs or Organizations:   . Attends Archivist Meetings:   [name] Marital Status:   Intimate Partner Violence:   . Fear of Current or Ex-Partner:   . Emotionally Abused:   [name] Physically Abused:   . Sexually Abused:     No Known Allergies  Current Outpatient Medications  Medication Sig Dispense Refill  . indapamide (LOZOL)  1.25 MG tablet Take 1.25 mg by mouth daily.    . rosuvastatin (CRESTOR) 10 MG tablet Take 10 mg by mouth daily.     No current facility-administered medications for this visit.    REVIEW OF SYSTEMS:  [X]  denotes positive finding, [ ]  denotes negative finding Cardiac  Comments:  Chest pain or chest pressure:    Shortness of breath upon exertion:    Short of breath when lying flat:    Irregular heart rhythm:        Vascular    Pain in calf, thigh, or hip brought on by ambulation: x   Pain in feet at night that wakes you up from your sleep:  x   Blood clot in your veins:    Leg swelling:         Pulmonary    Oxygen at home:    Productive cough:     Wheezing:         Neurologic    Sudden weakness in arms or legs:     Sudden numbness in arms or legs:     Sudden onset of difficulty speaking or slurred speech:    Temporary loss of vision in one eye:     Problems with dizziness:         Gastrointestinal    Blood in stool:     Vomited blood:         Genitourinary    Burning when urinating:     Blood in urine:        Psychiatric    Major depression:         Hematologic    Bleeding problems:    Problems with blood clotting too easily:        Skin    Rashes or ulcers:        Constitutional    Fever or chills:     PHYSICAL EXAM:   Vitals:   11/10/19 1334  BP: 120/78  Pulse: 81  Resp: 20  Temp: 97.6 F (36.4 C)  TempSrc: Temporal  SpO2: 94%  Weight: 170 lb 11.2 oz (77.4 kg)  Height: 5\' 2"  (1.575 m)    GENERAL: The patient is a well-nourished female, in no acute distress. The vital signs are documented above. CARDIAC: There is a regular rate and rhythm.  VASCULAR: I do not detect carotid bruits. She has palpable femoral, popliteal, dorsalis pedis, and posterior tibial pulses bilaterally. She has no significant lower extremity swelling. PULMONARY: There is good air exchange bilaterally without wheezing or rales. ABDOMEN: Soft and non-tender with normal  pitched bowel sounds.  MUSCULOSKELETAL: There are no major deformities or cyanosis. NEUROLOGIC: No  focal weakness or paresthesias are detected. SKIN: There are no ulcers or rashes noted. PSYCHIATRIC: The patient has a normal affect.  DATA:    ARTERIAL DOPPLER STUDY: I have independently interpreted her arterial Doppler study today.  On the right side there is a triphasic dorsalis pedis and posterior tibial signal.  ABI is 100%.  Toe pressures 110 mmHg.  On the left side there is a triphasic dorsalis pedis and posterior tibial signal.  ABIs 100%.  Toe pressures 106 mmHg.

## 2020-01-25 DIAGNOSIS — E1100 Type 2 diabetes mellitus with hyperosmolarity without nonketotic hyperglycemic-hyperosmolar coma (NKHHC): Secondary | ICD-10-CM | POA: Diagnosis not present

## 2020-01-25 DIAGNOSIS — I10 Essential (primary) hypertension: Secondary | ICD-10-CM | POA: Diagnosis not present

## 2020-01-25 DIAGNOSIS — M130 Polyarthritis, unspecified: Secondary | ICD-10-CM | POA: Diagnosis not present

## 2020-01-25 DIAGNOSIS — E7849 Other hyperlipidemia: Secondary | ICD-10-CM | POA: Diagnosis not present

## 2020-02-11 DIAGNOSIS — E7849 Other hyperlipidemia: Secondary | ICD-10-CM | POA: Diagnosis not present

## 2020-02-11 DIAGNOSIS — Z23 Encounter for immunization: Secondary | ICD-10-CM | POA: Diagnosis not present

## 2020-02-11 DIAGNOSIS — I10 Essential (primary) hypertension: Secondary | ICD-10-CM | POA: Diagnosis not present

## 2020-02-11 DIAGNOSIS — M130 Polyarthritis, unspecified: Secondary | ICD-10-CM | POA: Diagnosis not present

## 2020-02-11 DIAGNOSIS — E1169 Type 2 diabetes mellitus with other specified complication: Secondary | ICD-10-CM | POA: Diagnosis not present

## 2020-02-14 DIAGNOSIS — E1169 Type 2 diabetes mellitus with other specified complication: Secondary | ICD-10-CM | POA: Diagnosis not present

## 2020-02-14 DIAGNOSIS — M130 Polyarthritis, unspecified: Secondary | ICD-10-CM | POA: Diagnosis not present

## 2020-02-14 DIAGNOSIS — I10 Essential (primary) hypertension: Secondary | ICD-10-CM | POA: Diagnosis not present

## 2020-02-14 DIAGNOSIS — E7849 Other hyperlipidemia: Secondary | ICD-10-CM | POA: Diagnosis not present

## 2020-03-25 DIAGNOSIS — E7849 Other hyperlipidemia: Secondary | ICD-10-CM | POA: Diagnosis not present

## 2020-03-25 DIAGNOSIS — E1100 Type 2 diabetes mellitus with hyperosmolarity without nonketotic hyperglycemic-hyperosmolar coma (NKHHC): Secondary | ICD-10-CM | POA: Diagnosis not present

## 2020-03-25 DIAGNOSIS — I10 Essential (primary) hypertension: Secondary | ICD-10-CM | POA: Diagnosis not present

## 2020-03-25 DIAGNOSIS — M130 Polyarthritis, unspecified: Secondary | ICD-10-CM | POA: Diagnosis not present

## 2020-04-07 ENCOUNTER — Other Ambulatory Visit: Payer: Self-pay | Admitting: Family Medicine

## 2020-04-07 DIAGNOSIS — Z1231 Encounter for screening mammogram for malignant neoplasm of breast: Secondary | ICD-10-CM

## 2020-04-25 DIAGNOSIS — I10 Essential (primary) hypertension: Secondary | ICD-10-CM | POA: Diagnosis not present

## 2020-04-25 DIAGNOSIS — E7849 Other hyperlipidemia: Secondary | ICD-10-CM | POA: Diagnosis not present

## 2020-04-25 DIAGNOSIS — E1100 Type 2 diabetes mellitus with hyperosmolarity without nonketotic hyperglycemic-hyperosmolar coma (NKHHC): Secondary | ICD-10-CM | POA: Diagnosis not present

## 2020-04-25 DIAGNOSIS — M130 Polyarthritis, unspecified: Secondary | ICD-10-CM | POA: Diagnosis not present

## 2020-05-22 ENCOUNTER — Ambulatory Visit: Payer: Medicare Other

## 2020-06-14 DIAGNOSIS — E7849 Other hyperlipidemia: Secondary | ICD-10-CM | POA: Diagnosis not present

## 2020-06-14 DIAGNOSIS — M130 Polyarthritis, unspecified: Secondary | ICD-10-CM | POA: Diagnosis not present

## 2020-06-14 DIAGNOSIS — E1100 Type 2 diabetes mellitus with hyperosmolarity without nonketotic hyperglycemic-hyperosmolar coma (NKHHC): Secondary | ICD-10-CM | POA: Diagnosis not present

## 2020-06-14 DIAGNOSIS — I10 Essential (primary) hypertension: Secondary | ICD-10-CM | POA: Diagnosis not present

## 2020-06-15 DIAGNOSIS — E1169 Type 2 diabetes mellitus with other specified complication: Secondary | ICD-10-CM | POA: Diagnosis not present

## 2020-06-15 DIAGNOSIS — E7800 Pure hypercholesterolemia, unspecified: Secondary | ICD-10-CM | POA: Diagnosis not present

## 2020-06-15 DIAGNOSIS — M130 Polyarthritis, unspecified: Secondary | ICD-10-CM | POA: Diagnosis not present

## 2020-06-28 ENCOUNTER — Ambulatory Visit
Admission: RE | Admit: 2020-06-28 | Discharge: 2020-06-28 | Disposition: A | Discharge status: Home / Self Care | Payer: Medicare Other | Source: Ambulatory Visit | Attending: Family Medicine | Admitting: Family Medicine

## 2020-06-28 ENCOUNTER — Other Ambulatory Visit: Payer: Self-pay

## 2020-06-28 DIAGNOSIS — Z1231 Encounter for screening mammogram for malignant neoplasm of breast: Secondary | ICD-10-CM | POA: Diagnosis not present

## 2020-07-19 DIAGNOSIS — H2513 Age-related nuclear cataract, bilateral: Secondary | ICD-10-CM | POA: Diagnosis not present

## 2020-07-19 DIAGNOSIS — E119 Type 2 diabetes mellitus without complications: Secondary | ICD-10-CM | POA: Diagnosis not present

## 2020-08-24 DIAGNOSIS — E7849 Other hyperlipidemia: Secondary | ICD-10-CM | POA: Diagnosis not present

## 2020-08-24 DIAGNOSIS — I10 Essential (primary) hypertension: Secondary | ICD-10-CM | POA: Diagnosis not present

## 2020-08-24 DIAGNOSIS — E1100 Type 2 diabetes mellitus with hyperosmolarity without nonketotic hyperglycemic-hyperosmolar coma (NKHHC): Secondary | ICD-10-CM | POA: Diagnosis not present

## 2020-09-19 DIAGNOSIS — Z0000 Encounter for general adult medical examination without abnormal findings: Secondary | ICD-10-CM | POA: Diagnosis not present

## 2020-10-24 DIAGNOSIS — I10 Essential (primary) hypertension: Secondary | ICD-10-CM | POA: Diagnosis not present

## 2020-10-24 DIAGNOSIS — E7849 Other hyperlipidemia: Secondary | ICD-10-CM | POA: Diagnosis not present

## 2020-10-24 DIAGNOSIS — E1100 Type 2 diabetes mellitus with hyperosmolarity without nonketotic hyperglycemic-hyperosmolar coma (NKHHC): Secondary | ICD-10-CM | POA: Diagnosis not present

## 2020-11-13 DIAGNOSIS — M130 Polyarthritis, unspecified: Secondary | ICD-10-CM | POA: Diagnosis not present

## 2020-11-13 DIAGNOSIS — E7849 Other hyperlipidemia: Secondary | ICD-10-CM | POA: Diagnosis not present

## 2020-11-13 DIAGNOSIS — I10 Essential (primary) hypertension: Secondary | ICD-10-CM | POA: Diagnosis not present

## 2020-11-13 DIAGNOSIS — E1100 Type 2 diabetes mellitus with hyperosmolarity without nonketotic hyperglycemic-hyperosmolar coma (NKHHC): Secondary | ICD-10-CM | POA: Diagnosis not present

## 2020-11-16 DIAGNOSIS — E7849 Other hyperlipidemia: Secondary | ICD-10-CM | POA: Diagnosis not present

## 2020-11-16 DIAGNOSIS — I10 Essential (primary) hypertension: Secondary | ICD-10-CM | POA: Diagnosis not present

## 2020-11-16 DIAGNOSIS — E1169 Type 2 diabetes mellitus with other specified complication: Secondary | ICD-10-CM | POA: Diagnosis not present

## 2020-11-16 DIAGNOSIS — F17210 Nicotine dependence, cigarettes, uncomplicated: Secondary | ICD-10-CM | POA: Diagnosis not present

## 2020-11-16 DIAGNOSIS — M130 Polyarthritis, unspecified: Secondary | ICD-10-CM | POA: Diagnosis not present

## 2020-11-23 DIAGNOSIS — E7849 Other hyperlipidemia: Secondary | ICD-10-CM | POA: Diagnosis not present

## 2020-11-23 DIAGNOSIS — E1100 Type 2 diabetes mellitus with hyperosmolarity without nonketotic hyperglycemic-hyperosmolar coma (NKHHC): Secondary | ICD-10-CM | POA: Diagnosis not present

## 2020-11-23 DIAGNOSIS — I10 Essential (primary) hypertension: Secondary | ICD-10-CM | POA: Diagnosis not present

## 2021-01-24 DIAGNOSIS — E7849 Other hyperlipidemia: Secondary | ICD-10-CM | POA: Diagnosis not present

## 2021-01-24 DIAGNOSIS — E1100 Type 2 diabetes mellitus with hyperosmolarity without nonketotic hyperglycemic-hyperosmolar coma (NKHHC): Secondary | ICD-10-CM | POA: Diagnosis not present

## 2021-01-24 DIAGNOSIS — I10 Essential (primary) hypertension: Secondary | ICD-10-CM | POA: Diagnosis not present

## 2021-02-23 DIAGNOSIS — E1100 Type 2 diabetes mellitus with hyperosmolarity without nonketotic hyperglycemic-hyperosmolar coma (NKHHC): Secondary | ICD-10-CM | POA: Diagnosis not present

## 2021-02-23 DIAGNOSIS — I10 Essential (primary) hypertension: Secondary | ICD-10-CM | POA: Diagnosis not present

## 2021-02-23 DIAGNOSIS — E7849 Other hyperlipidemia: Secondary | ICD-10-CM | POA: Diagnosis not present

## 2021-03-14 DIAGNOSIS — E1169 Type 2 diabetes mellitus with other specified complication: Secondary | ICD-10-CM | POA: Diagnosis not present

## 2021-03-14 DIAGNOSIS — I10 Essential (primary) hypertension: Secondary | ICD-10-CM | POA: Diagnosis not present

## 2021-03-14 DIAGNOSIS — E782 Mixed hyperlipidemia: Secondary | ICD-10-CM | POA: Diagnosis not present

## 2021-03-19 DIAGNOSIS — I10 Essential (primary) hypertension: Secondary | ICD-10-CM | POA: Diagnosis not present

## 2021-03-19 DIAGNOSIS — E1169 Type 2 diabetes mellitus with other specified complication: Secondary | ICD-10-CM | POA: Diagnosis not present

## 2021-03-19 DIAGNOSIS — F17210 Nicotine dependence, cigarettes, uncomplicated: Secondary | ICD-10-CM | POA: Diagnosis not present

## 2021-03-19 DIAGNOSIS — E782 Mixed hyperlipidemia: Secondary | ICD-10-CM | POA: Diagnosis not present

## 2021-03-19 DIAGNOSIS — R591 Generalized enlarged lymph nodes: Secondary | ICD-10-CM | POA: Diagnosis not present

## 2021-03-20 ENCOUNTER — Other Ambulatory Visit: Payer: Self-pay

## 2021-03-20 ENCOUNTER — Other Ambulatory Visit: Payer: Self-pay | Admitting: Family Medicine

## 2021-03-20 ENCOUNTER — Ambulatory Visit
Admission: RE | Admit: 2021-03-20 | Discharge: 2021-03-20 | Disposition: A | Discharge status: Home / Self Care | Payer: Medicare Other | Source: Ambulatory Visit | Attending: Family Medicine | Admitting: Family Medicine

## 2021-03-20 DIAGNOSIS — F17210 Nicotine dependence, cigarettes, uncomplicated: Secondary | ICD-10-CM

## 2021-03-20 DIAGNOSIS — M25512 Pain in left shoulder: Secondary | ICD-10-CM

## 2021-03-20 DIAGNOSIS — M2578 Osteophyte, vertebrae: Secondary | ICD-10-CM | POA: Diagnosis not present

## 2021-03-20 DIAGNOSIS — R590 Localized enlarged lymph nodes: Secondary | ICD-10-CM

## 2021-03-20 DIAGNOSIS — M19012 Primary osteoarthritis, left shoulder: Secondary | ICD-10-CM | POA: Diagnosis not present

## 2021-03-20 DIAGNOSIS — M47812 Spondylosis without myelopathy or radiculopathy, cervical region: Secondary | ICD-10-CM | POA: Diagnosis not present

## 2021-03-20 DIAGNOSIS — Z720 Tobacco use: Secondary | ICD-10-CM | POA: Diagnosis not present

## 2021-03-27 DIAGNOSIS — M25512 Pain in left shoulder: Secondary | ICD-10-CM | POA: Diagnosis not present

## 2021-04-25 DIAGNOSIS — Z86010 Personal history of colonic polyps: Secondary | ICD-10-CM | POA: Diagnosis not present

## 2021-04-25 DIAGNOSIS — I10 Essential (primary) hypertension: Secondary | ICD-10-CM | POA: Diagnosis not present

## 2021-05-12 IMAGING — MG MM DIGITAL SCREENING BILAT W/ TOMO AND CAD
8 series · 9 of 24 positions shown · non-contrast
Comparison: Previous exam(s).

CLINICAL DATA: Screening.

EXAM:
DIGITAL SCREENING BILATERAL MAMMOGRAM WITH TOMO AND CAD

[L MLO synth-2D]
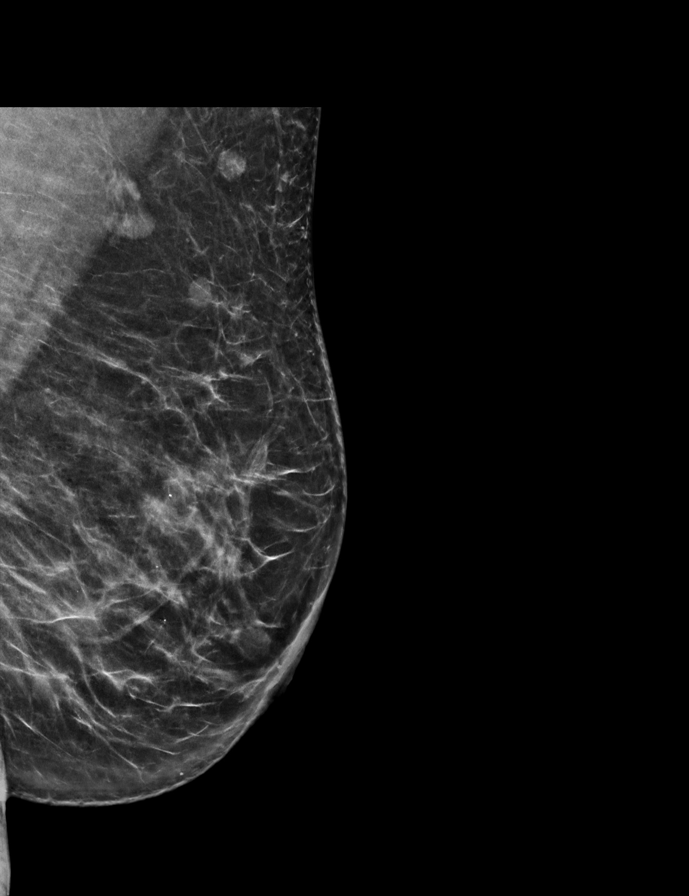

[L CC synth-2D]
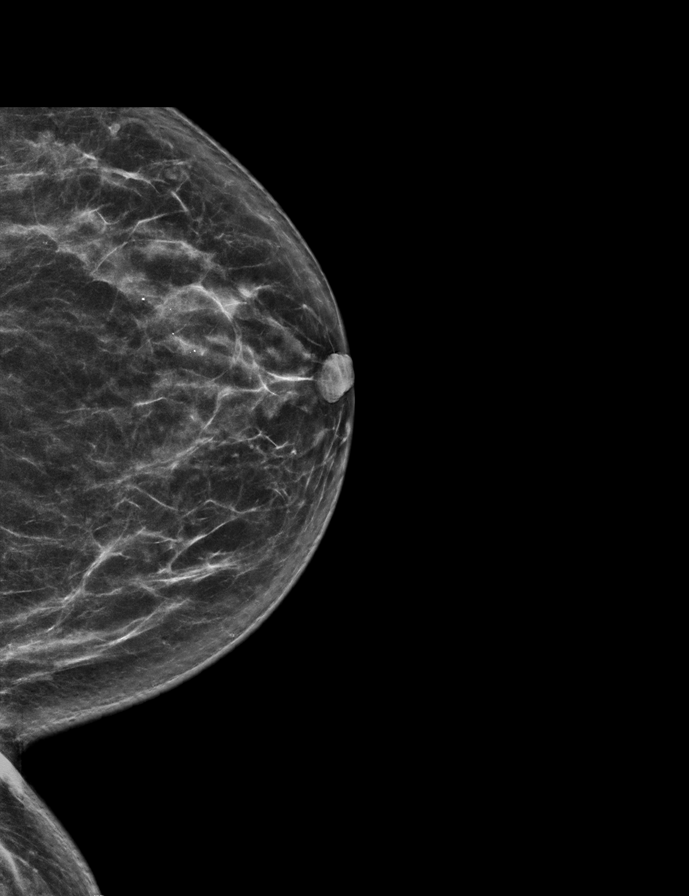

[R MLO synth-2D]
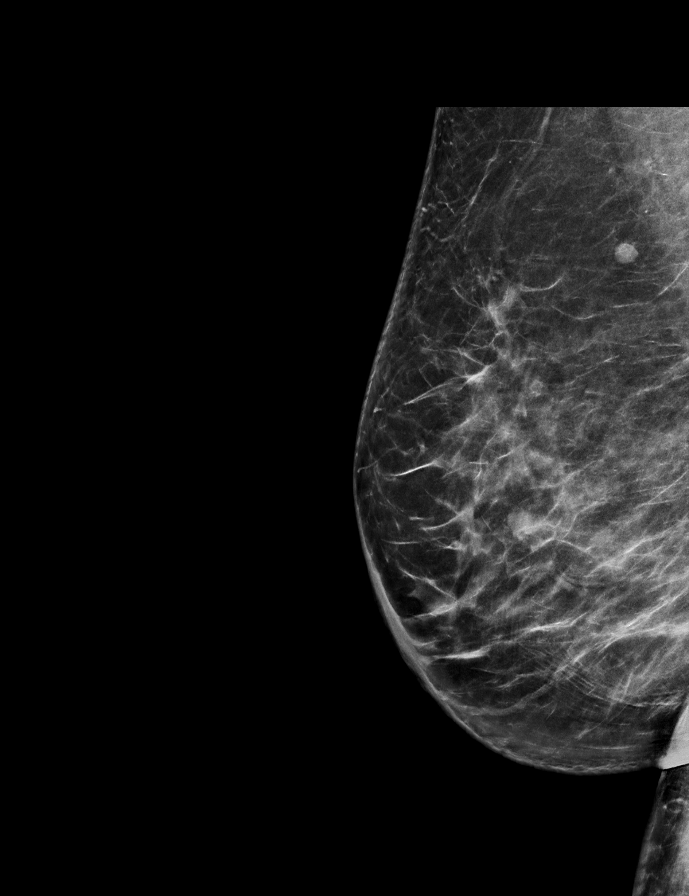

[R CC synth-2D]
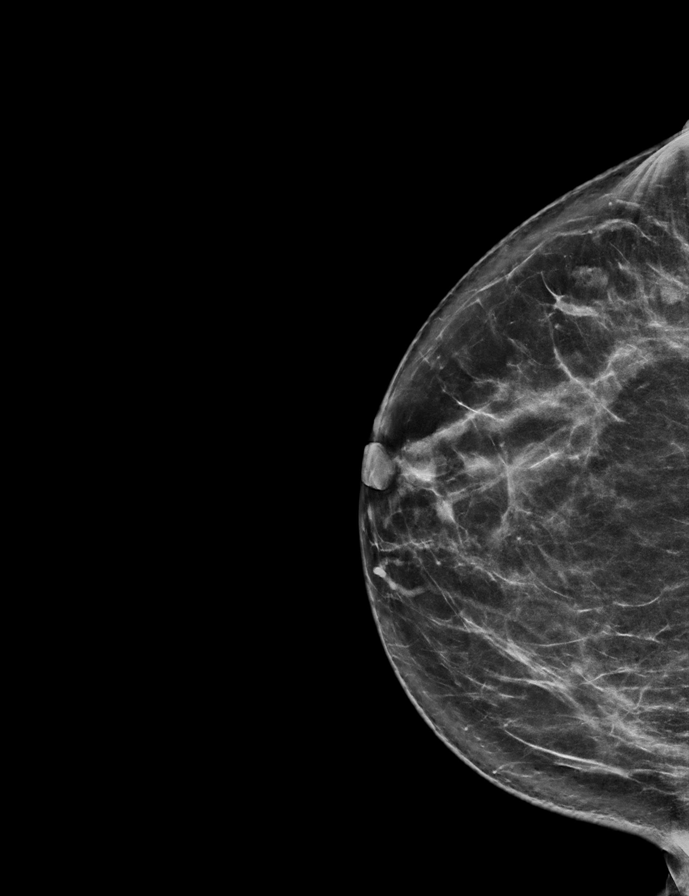

[L CC tomo · 2 of 66 frames shown]
[frame 22/66]
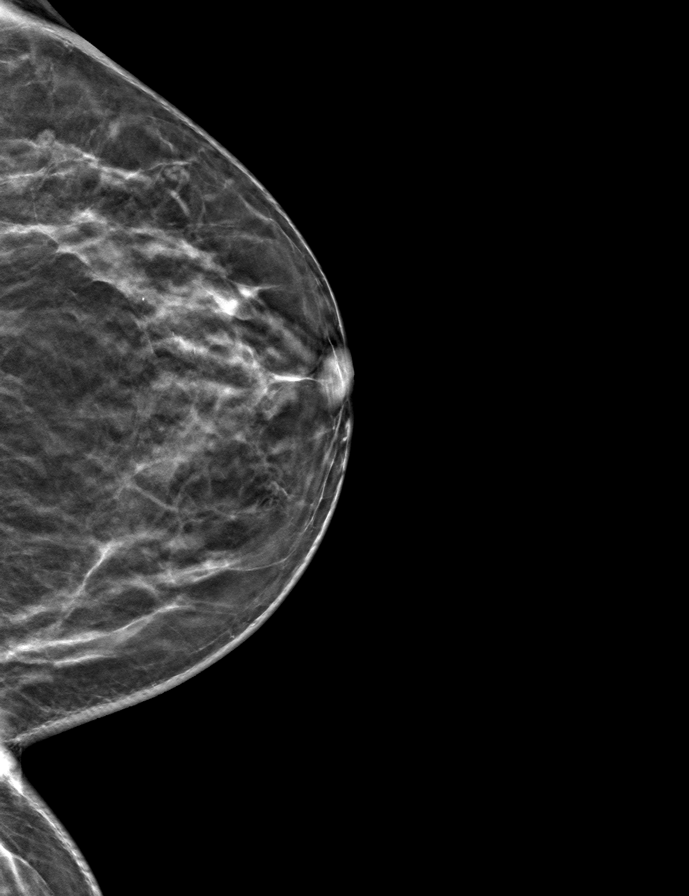
[frame 33/66]
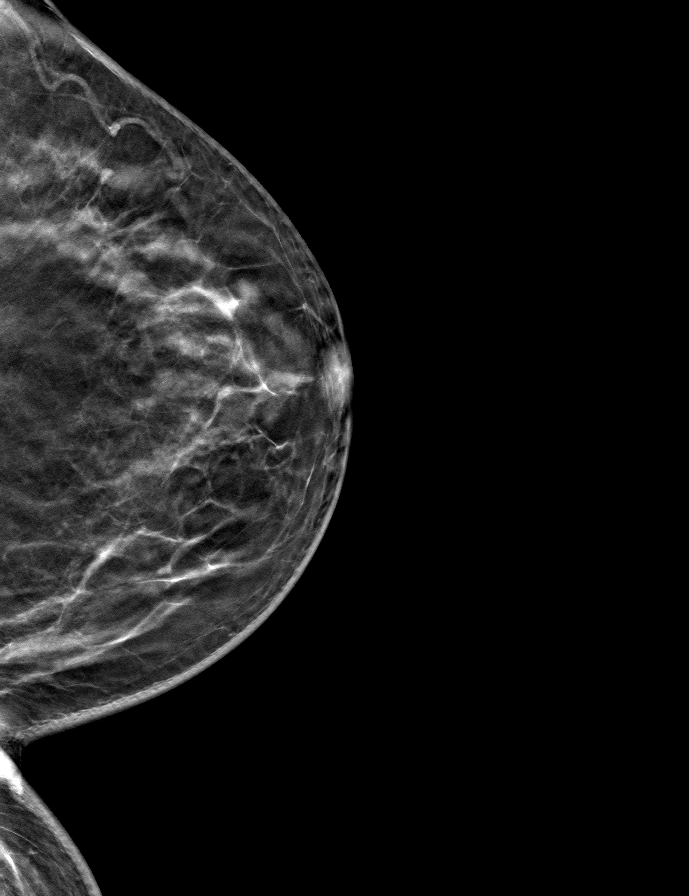

[R CC tomo · tomo slice 36/71.0]
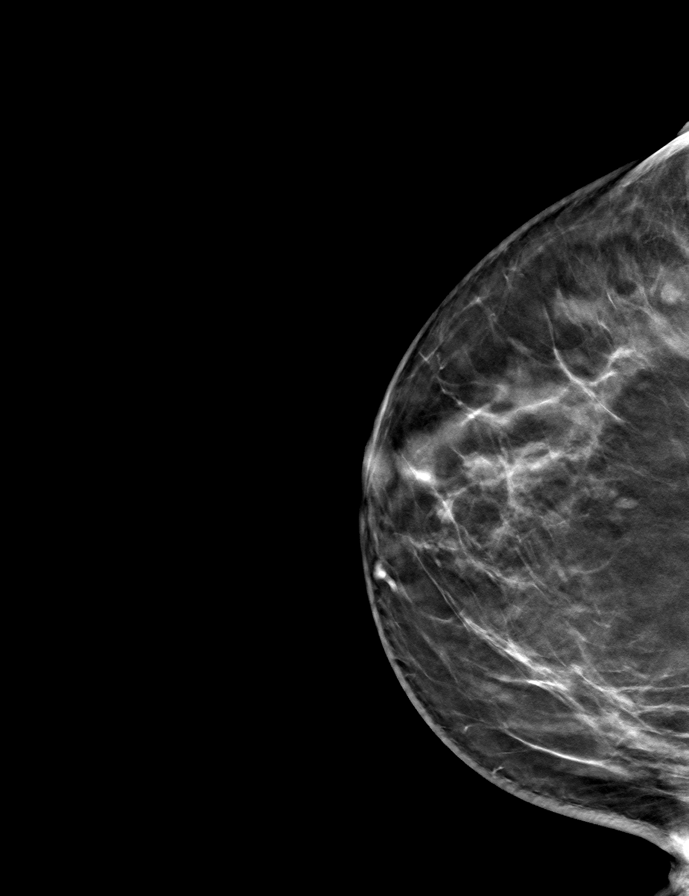

[R MLO tomo · tomo slice 39/77.0]
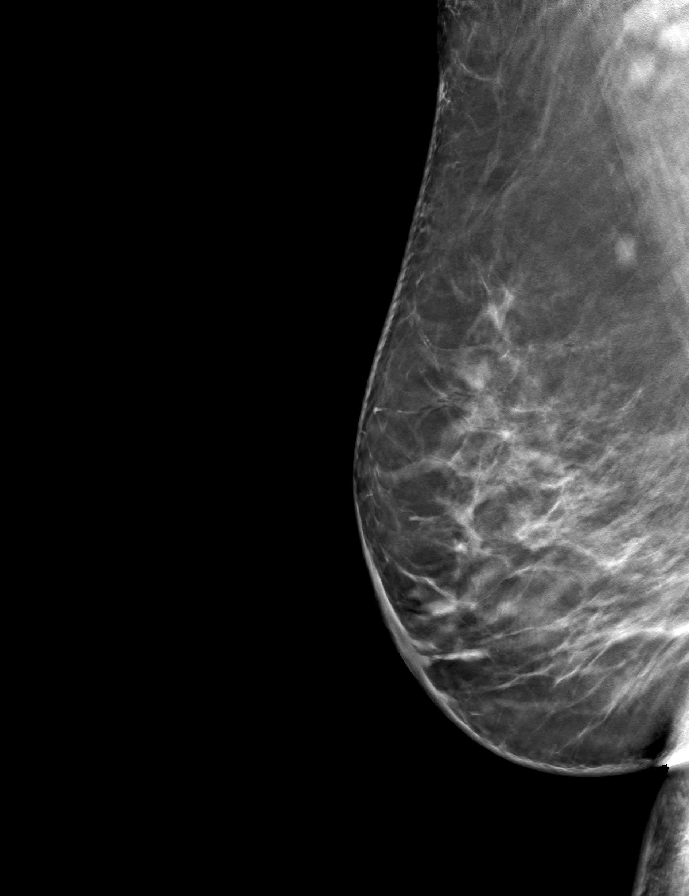

[L MLO tomo · tomo slice 37/74.0]
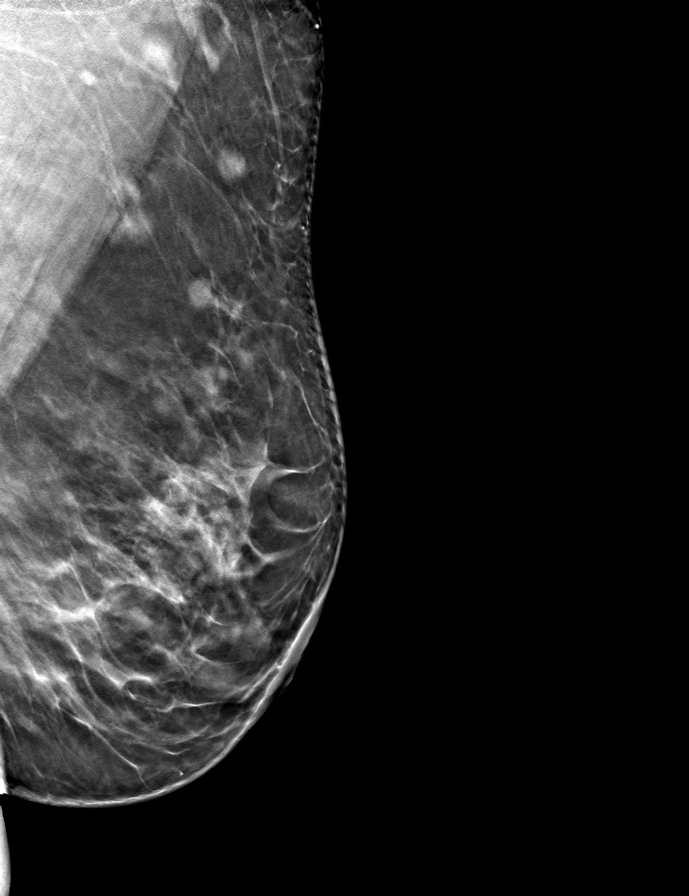

[9 of 24 positions shown; findings below may reference images not displayed]

ACR Breast Density Category b: There are scattered areas of
fibroglandular density.
FINDINGS: There are no findings suspicious for malignancy. The images were
evaluated with computer-aided detection.
IMPRESSION: No mammographic evidence of malignancy. A result letter of this
screening mammogram will be mailed directly to the patient.

RECOMMENDATION:
Screening mammogram in one year. (Code:ZP-7-VX7)

BI-RADS CATEGORY  1: Negative.

## 2021-05-25 DIAGNOSIS — E7849 Other hyperlipidemia: Secondary | ICD-10-CM | POA: Diagnosis not present

## 2021-05-25 DIAGNOSIS — I10 Essential (primary) hypertension: Secondary | ICD-10-CM | POA: Diagnosis not present

## 2021-05-25 DIAGNOSIS — E1100 Type 2 diabetes mellitus with hyperosmolarity without nonketotic hyperglycemic-hyperosmolar coma (NKHHC): Secondary | ICD-10-CM | POA: Diagnosis not present

## 2021-05-30 ENCOUNTER — Other Ambulatory Visit: Payer: Self-pay | Admitting: Family Medicine

## 2021-05-30 DIAGNOSIS — Z1231 Encounter for screening mammogram for malignant neoplasm of breast: Secondary | ICD-10-CM

## 2021-06-05 DIAGNOSIS — K635 Polyp of colon: Secondary | ICD-10-CM | POA: Diagnosis not present

## 2021-06-05 DIAGNOSIS — D125 Benign neoplasm of sigmoid colon: Secondary | ICD-10-CM | POA: Diagnosis not present

## 2021-06-05 DIAGNOSIS — Z86010 Personal history of colonic polyps: Secondary | ICD-10-CM | POA: Diagnosis not present

## 2021-06-05 DIAGNOSIS — D124 Benign neoplasm of descending colon: Secondary | ICD-10-CM | POA: Diagnosis not present

## 2021-06-24 DIAGNOSIS — I10 Essential (primary) hypertension: Secondary | ICD-10-CM | POA: Diagnosis not present

## 2021-06-24 DIAGNOSIS — E7849 Other hyperlipidemia: Secondary | ICD-10-CM | POA: Diagnosis not present

## 2021-06-24 DIAGNOSIS — E1100 Type 2 diabetes mellitus with hyperosmolarity without nonketotic hyperglycemic-hyperosmolar coma (NKHHC): Secondary | ICD-10-CM | POA: Diagnosis not present

## 2021-06-29 ENCOUNTER — Ambulatory Visit
Admission: RE | Admit: 2021-06-29 | Discharge: 2021-06-29 | Disposition: A | Discharge status: Home / Self Care | Payer: Medicare Other | Source: Ambulatory Visit | Attending: Family Medicine | Admitting: Family Medicine

## 2021-06-29 DIAGNOSIS — Z1231 Encounter for screening mammogram for malignant neoplasm of breast: Secondary | ICD-10-CM

## 2021-07-18 DIAGNOSIS — E7849 Other hyperlipidemia: Secondary | ICD-10-CM | POA: Diagnosis not present

## 2021-07-18 DIAGNOSIS — E1100 Type 2 diabetes mellitus with hyperosmolarity without nonketotic hyperglycemic-hyperosmolar coma (NKHHC): Secondary | ICD-10-CM | POA: Diagnosis not present

## 2021-07-18 DIAGNOSIS — F17210 Nicotine dependence, cigarettes, uncomplicated: Secondary | ICD-10-CM | POA: Diagnosis not present

## 2021-07-18 DIAGNOSIS — I10 Essential (primary) hypertension: Secondary | ICD-10-CM | POA: Diagnosis not present

## 2021-07-20 DIAGNOSIS — E1169 Type 2 diabetes mellitus with other specified complication: Secondary | ICD-10-CM | POA: Diagnosis not present

## 2021-07-20 DIAGNOSIS — E1100 Type 2 diabetes mellitus with hyperosmolarity without nonketotic hyperglycemic-hyperosmolar coma (NKHHC): Secondary | ICD-10-CM | POA: Diagnosis not present

## 2021-07-20 DIAGNOSIS — F17210 Nicotine dependence, cigarettes, uncomplicated: Secondary | ICD-10-CM | POA: Diagnosis not present

## 2021-07-20 DIAGNOSIS — N189 Chronic kidney disease, unspecified: Secondary | ICD-10-CM | POA: Diagnosis not present

## 2021-07-20 DIAGNOSIS — I10 Essential (primary) hypertension: Secondary | ICD-10-CM | POA: Diagnosis not present

## 2021-08-24 DIAGNOSIS — I10 Essential (primary) hypertension: Secondary | ICD-10-CM | POA: Diagnosis not present

## 2021-08-24 DIAGNOSIS — E7849 Other hyperlipidemia: Secondary | ICD-10-CM | POA: Diagnosis not present

## 2021-08-24 DIAGNOSIS — E1100 Type 2 diabetes mellitus with hyperosmolarity without nonketotic hyperglycemic-hyperosmolar coma (NKHHC): Secondary | ICD-10-CM | POA: Diagnosis not present

## 2021-10-24 DIAGNOSIS — I10 Essential (primary) hypertension: Secondary | ICD-10-CM | POA: Diagnosis not present

## 2021-10-24 DIAGNOSIS — E7849 Other hyperlipidemia: Secondary | ICD-10-CM | POA: Diagnosis not present

## 2021-11-14 DIAGNOSIS — E7849 Other hyperlipidemia: Secondary | ICD-10-CM | POA: Diagnosis not present

## 2021-11-14 DIAGNOSIS — E1169 Type 2 diabetes mellitus with other specified complication: Secondary | ICD-10-CM | POA: Diagnosis not present

## 2021-11-14 DIAGNOSIS — N189 Chronic kidney disease, unspecified: Secondary | ICD-10-CM | POA: Diagnosis not present

## 2021-11-14 DIAGNOSIS — I10 Essential (primary) hypertension: Secondary | ICD-10-CM | POA: Diagnosis not present

## 2021-11-16 ENCOUNTER — Other Ambulatory Visit: Payer: Self-pay | Admitting: Obstetrics and Gynecology

## 2021-11-16 DIAGNOSIS — Z0000 Encounter for general adult medical examination without abnormal findings: Secondary | ICD-10-CM | POA: Diagnosis not present

## 2021-11-16 DIAGNOSIS — N189 Chronic kidney disease, unspecified: Secondary | ICD-10-CM | POA: Diagnosis not present

## 2021-11-16 DIAGNOSIS — E1169 Type 2 diabetes mellitus with other specified complication: Secondary | ICD-10-CM | POA: Diagnosis not present

## 2021-11-16 DIAGNOSIS — E7849 Other hyperlipidemia: Secondary | ICD-10-CM | POA: Diagnosis not present

## 2021-11-16 DIAGNOSIS — I10 Essential (primary) hypertension: Secondary | ICD-10-CM | POA: Diagnosis not present

## 2021-11-16 DIAGNOSIS — F17200 Nicotine dependence, unspecified, uncomplicated: Secondary | ICD-10-CM

## 2021-11-26 DIAGNOSIS — H40023 Open angle with borderline findings, high risk, bilateral: Secondary | ICD-10-CM | POA: Diagnosis not present

## 2021-11-26 DIAGNOSIS — E119 Type 2 diabetes mellitus without complications: Secondary | ICD-10-CM | POA: Diagnosis not present

## 2021-11-26 DIAGNOSIS — Z961 Presence of intraocular lens: Secondary | ICD-10-CM | POA: Diagnosis not present

## 2021-12-17 ENCOUNTER — Other Ambulatory Visit: Payer: Self-pay | Admitting: Family Medicine

## 2021-12-17 ENCOUNTER — Ambulatory Visit
Admission: RE | Admit: 2021-12-17 | Discharge: 2021-12-17 | Disposition: A | Discharge status: Home / Self Care | Payer: Medicare Other | Source: Ambulatory Visit | Attending: Obstetrics and Gynecology | Admitting: Obstetrics and Gynecology

## 2021-12-17 ENCOUNTER — Ambulatory Visit
Admission: RE | Admit: 2021-12-17 | Discharge: 2021-12-17 | Disposition: A | Discharge status: Home / Self Care | Payer: Medicare Other | Source: Ambulatory Visit | Attending: Family Medicine | Admitting: Family Medicine

## 2021-12-17 DIAGNOSIS — R590 Localized enlarged lymph nodes: Secondary | ICD-10-CM | POA: Diagnosis not present

## 2021-12-17 DIAGNOSIS — F17200 Nicotine dependence, unspecified, uncomplicated: Secondary | ICD-10-CM

## 2021-12-17 DIAGNOSIS — I889 Nonspecific lymphadenitis, unspecified: Secondary | ICD-10-CM

## 2021-12-17 DIAGNOSIS — I700 Atherosclerosis of aorta: Secondary | ICD-10-CM | POA: Diagnosis not present

## 2021-12-17 DIAGNOSIS — J432 Centrilobular emphysema: Secondary | ICD-10-CM | POA: Diagnosis not present

## 2021-12-17 DIAGNOSIS — F17210 Nicotine dependence, cigarettes, uncomplicated: Secondary | ICD-10-CM | POA: Diagnosis not present

## 2021-12-17 DIAGNOSIS — Z8679 Personal history of other diseases of the circulatory system: Secondary | ICD-10-CM | POA: Diagnosis not present

## 2021-12-24 DIAGNOSIS — E1100 Type 2 diabetes mellitus with hyperosmolarity without nonketotic hyperglycemic-hyperosmolar coma (NKHHC): Secondary | ICD-10-CM | POA: Diagnosis not present

## 2021-12-24 DIAGNOSIS — I10 Essential (primary) hypertension: Secondary | ICD-10-CM | POA: Diagnosis not present

## 2021-12-24 DIAGNOSIS — E7849 Other hyperlipidemia: Secondary | ICD-10-CM | POA: Diagnosis not present

## 2022-01-23 ENCOUNTER — Ambulatory Visit: Payer: Medicare Other | Admitting: Pulmonary Disease

## 2022-01-23 ENCOUNTER — Encounter: Payer: Self-pay | Admitting: Pulmonary Disease

## 2022-01-23 VITALS — BP 124/66 | HR 63 | Temp 98.4°F | Ht 64.0 in | Wt 172.4 lb

## 2022-01-23 DIAGNOSIS — R590 Localized enlarged lymph nodes: Secondary | ICD-10-CM

## 2022-01-23 DIAGNOSIS — R911 Solitary pulmonary nodule: Secondary | ICD-10-CM

## 2022-01-23 DIAGNOSIS — R599 Enlarged lymph nodes, unspecified: Secondary | ICD-10-CM | POA: Diagnosis not present

## 2022-01-23 DIAGNOSIS — R918 Other nonspecific abnormal finding of lung field: Secondary | ICD-10-CM | POA: Diagnosis not present

## 2022-01-23 NOTE — Patient Instructions (Signed)
Thank you for visiting Dr. Valeta Harms at [facility]. Today we recommend the following:  Orders Placed This Encounter  Procedures   NM PET Image Initial (PI) Skull Base To Thigh   Follow up with [place] after your PET scan   Return in about 19 days (around 02/11/2022) for with Eric Form, NP, or Dr. Valeta Harms.    Please do your part to reduce the spread of COVID-19.

## 2022-01-23 NOTE — H&P (View-Only) (Signed)
Synopsis: Referred in August 2023 for lung nodule by [name], MD  Subjective:   PATIENT ID: Yolanda Robertson: female DOB: 1946-01-27, MRN: [id number]  Chief Complaint  Patient presents with   Consult    Pt states that she had a CT chest done and was noted she had a left lung mass. Pt states that she has has some SOB but nothing noticeable to her.     This is a 76 year old female past medical history of hypertension, hyperlipidemia.Patient had a lung cancer screening CT completed on 12/17/2021 which was read as a lung RADS 4X.  Patient was found to have a irregular nodule within the left upper lobe at 11.5 mm.  Was also found to have a AP window lymph node measuring 2.1 cm.  Patient also complaining today of enlarged lymph node in her left neck.    Past Medical History:  Diagnosis Date   Hyperlipidemia    Hypertension      No family history on file.   Past Surgical History:  Procedure Laterality Date   VAGINAL HYSTERECTOMY      Social History   Socioeconomic History   Marital status: Married    Spouse name: Not on file   Number of children: 4   Years of education: Not on file   Highest education level: Not on file  Occupational History   Not on file  Tobacco Use   Smoking status: Former    Packs/day: 1.00    Types: Cigarettes    Quit date: 12/23/2021    Years since quitting: 0.0   Smokeless tobacco: Never  Vaping Use   Vaping Use: Never used  Substance and Sexual Activity   Alcohol use: Never   Drug use: Never   Sexual activity: Yes  Other Topics Concern   Not on file  Social History Narrative   Not on file   Social Determinants of Health   Financial Resource Strain: Not on file  Food Insecurity: Not on file  Transportation Needs: Not on file  Physical Activity: Not on file  Stress: Not on file  Social Connections: Not on file  Intimate Partner Violence: Not on file     No Known Allergies   Outpatient Medications Prior to Visit   Medication Sig Dispense Refill   indapamide (LOZOL) 1.25 MG tablet Take 1.25 mg by mouth daily.     rosuvastatin (CRESTOR) 10 MG tablet Take 10 mg by mouth daily.     No facility-administered medications prior to visit.    Review of Systems  Constitutional:  Negative for chills, fever, malaise/fatigue and weight loss.  HENT:  Negative for hearing loss, sore throat and tinnitus.   Eyes:  Negative for blurred vision and double vision.  Respiratory:  Negative for cough, hemoptysis, sputum production, shortness of breath, wheezing and stridor.   Cardiovascular:  Negative for chest pain, palpitations, orthopnea, leg swelling and PND.  Gastrointestinal:  Negative for abdominal pain, constipation, diarrhea, heartburn, nausea and vomiting.  Genitourinary:  Negative for dysuria, hematuria and urgency.  Musculoskeletal:  Negative for joint pain and myalgias.  Skin:  Negative for itching and rash.  Neurological:  Negative for dizziness, tingling, weakness and headaches.  Endo/Heme/Allergies:  Negative for environmental allergies. Does not bruise/bleed easily.  Psychiatric/Behavioral:  Negative for depression. The patient is not nervous/anxious and does not have insomnia.   All other systems reviewed and are negative.    Objective:  Physical Exam Vitals reviewed.  Constitutional:  General: She is not in acute distress.    Appearance: She is well-developed.  HENT:     Head: Normocephalic and atraumatic.  Eyes:     General: No scleral icterus.    Conjunctiva/sclera: Conjunctivae normal.     Pupils: Pupils are equal, round, and reactive to light.  Neck:     Vascular: No JVD.     Trachea: No tracheal deviation.  Cardiovascular:     Rate and Rhythm: Normal rate and regular rhythm.     Heart sounds: Normal heart sounds. No murmur heard. Pulmonary:     Effort: Pulmonary effort is normal. No tachypnea, accessory muscle usage or respiratory distress.     Breath sounds: No stridor. No  wheezing, rhonchi or rales.  Abdominal:     General: There is no distension.     Palpations: Abdomen is soft.     Tenderness: There is no abdominal tenderness.  Musculoskeletal:        General: No tenderness.     Cervical back: Neck supple.  Lymphadenopathy:     Cervical: Cervical adenopathy (left) present.  Skin:    General: Skin is warm and dry.     Capillary Refill: Capillary refill takes less than 2 seconds.     Findings: No rash.  Neurological:     Mental Status: She is alert and oriented to person, place, and time.  Psychiatric:        Behavior: Behavior normal.      Vitals:   01/23/22 0930  BP: 124/66  Pulse: 63  Temp: 98.4 F (36.9 C)  TempSrc: Oral  SpO2: 96%  Weight: 172 lb 6.4 oz (78.2 kg)  Height: 5\' 4"  (1.626 m)   96% on RA BMI Readings from Last 3 Encounters:  01/23/22 29.59 kg/m  11/10/19 31.22 kg/m   Wt Readings from Last 3 Encounters:  01/23/22 172 lb 6.4 oz (78.2 kg)  11/10/19 170 lb 11.2 oz (77.4 kg)     CBC No results found for: "WBC", "RBC", "HGB", "HCT", "PLT", "MCV", "MCH", "MCHC", "RDW", "LYMPHSABS", "MONOABS", "EOSABS", "BASOSABS"  Chest Imaging:  July 2023 lung cancer screening CT: AP window adenopathy, lung nodule in the left upper lobe Evidence of emphysema  Pulmonary Functions Testing Results:     No data to display          FeNO:   Pathology:   Echocardiogram:   Heart Catheterization:     Assessment & Plan:     ICD-10-CM   1. Lung nodule  R91.1 NM PET Image Initial (PI) Skull Base To Thigh    2. Adenopathy  R59.9 NM PET Image Initial (PI) Skull Base To Thigh    3. Abnormal CT lung screening  R91.8     4. Cervical adenopathy  R59.0       Discussion:  This is a 76 year old female referred for left upper lobe lung nodule found to have an enlarged AP window lymph node.  Longstanding history of tobacco use.  She also has a palpable left cervical lymph node.  Plan: I think next best step would be  including a nuclear medicine PET scan. Pending on her PET scan results we will likely be able to refer her to interventional radiology for cervical lymph node biopsy. I expect we are dealing with an advanced age malignancy. If we need to biopsy the small lung nodule in the future we can do such.  But I do believe we need to figure out what is going on with the  cervical lymph node as well as the AP window lymph node first.  They could all be related to a lung cancer however we could be dealing with separate processes  The PET scan will help [place] delineate next best location to biopsy  Follow-up with me or SG, NP in 2 weeks after PET scan.   Current Outpatient Medications:    indapamide (LOZOL) 1.25 MG tablet, Take 1.25 mg by mouth daily., Disp: , Rfl:    rosuvastatin (CRESTOR) 10 MG tablet, Take 10 mg by mouth daily., Disp: , Rfl:   I spent 62 minutes dedicated to the care of this patient on the date of this encounter to include pre-visit review of records, face-to-face time with the patient discussing conditions above, post visit ordering of testing, clinical documentation with the electronic health record, making appropriate referrals as documented, and communicating necessary findings to members of the patients care team.   Garner Nash, DO [facility] 01/23/2022 9:55 AM

## 2022-01-23 NOTE — Progress Notes (Signed)
Synopsis: Referred in August 2023 for lung nodule by [name], MD  Subjective:   PATIENT ID: Yolanda Robertson: female DOB: [birth year], MRN: [id number]  Chief Complaint  Patient presents with   Consult    Pt states that she had a CT chest done and was noted she had a left lung mass. Pt states that she has has some SOB but nothing noticeable to her.     This is a 76 year old female past medical history of hypertension, hyperlipidemia.Patient had a lung cancer screening CT completed on 12/17/2021 which was read as a lung RADS 4X.  Patient was found to have a irregular nodule within the left upper lobe at 11.5 mm.  Was also found to have a AP window lymph node measuring 2.1 cm.  Patient also complaining today of enlarged lymph node in her left neck.    Past Medical History:  Diagnosis Date   Hyperlipidemia    Hypertension      No family history on file.   Past Surgical History:  Procedure Laterality Date   VAGINAL HYSTERECTOMY      Social History   Socioeconomic History   Marital status: Married    Spouse name: Not on file   Number of children: 4   Years of education: Not on file   Highest education level: Not on file  Occupational History   Not on file  Tobacco Use   Smoking status: Former    Packs/day: 1.00    Types: Cigarettes    Quit date: 12/23/2021    Years since quitting: 0.0   Smokeless tobacco: Never  Vaping Use   Vaping Use: Never used  Substance and Sexual Activity   Alcohol use: Never   Drug use: Never   Sexual activity: Yes  Other Topics Concern   Not on file  Social History Narrative   Not on file   Social Determinants of Health   Financial Resource Strain: Not on file  Food Insecurity: Not on file  Transportation Needs: Not on file  Physical Activity: Not on file  Stress: Not on file  Social Connections: Not on file  Intimate Partner Violence: Not on file     No Known Allergies   Outpatient Medications Prior to Visit   Medication Sig Dispense Refill   indapamide (LOZOL) 1.25 MG tablet Take 1.25 mg by mouth daily.     rosuvastatin (CRESTOR) 10 MG tablet Take 10 mg by mouth daily.     No facility-administered medications prior to visit.    Review of Systems  Constitutional:  Negative for chills, fever, malaise/fatigue and weight loss.  HENT:  Negative for hearing loss, sore throat and tinnitus.   Eyes:  Negative for blurred vision and double vision.  Respiratory:  Negative for cough, hemoptysis, sputum production, shortness of breath, wheezing and stridor.   Cardiovascular:  Negative for chest pain, palpitations, orthopnea, leg swelling and PND.  Gastrointestinal:  Negative for abdominal pain, constipation, diarrhea, heartburn, nausea and vomiting.  Genitourinary:  Negative for dysuria, hematuria and urgency.  Musculoskeletal:  Negative for joint pain and myalgias.  Skin:  Negative for itching and rash.  Neurological:  Negative for dizziness, tingling, weakness and headaches.  Endo/Heme/Allergies:  Negative for environmental allergies. Does not bruise/bleed easily.  Psychiatric/Behavioral:  Negative for depression. The patient is not nervous/anxious and does not have insomnia.   All other systems reviewed and are negative.    Objective:  Physical Exam Vitals reviewed.  Constitutional:  General: She is not in acute distress.    Appearance: She is well-developed.  HENT:     Head: Normocephalic and atraumatic.  Eyes:     General: No scleral icterus.    Conjunctiva/sclera: Conjunctivae normal.     Pupils: Pupils are equal, round, and reactive to light.  Neck:     Vascular: No JVD.     Trachea: No tracheal deviation.  Cardiovascular:     Rate and Rhythm: Normal rate and regular rhythm.     Heart sounds: Normal heart sounds. No murmur heard. Pulmonary:     Effort: Pulmonary effort is normal. No tachypnea, accessory muscle usage or respiratory distress.     Breath sounds: No stridor. No  wheezing, rhonchi or rales.  Abdominal:     General: There is no distension.     Palpations: Abdomen is soft.     Tenderness: There is no abdominal tenderness.  Musculoskeletal:        General: No tenderness.     Cervical back: Neck supple.  Lymphadenopathy:     Cervical: Cervical adenopathy (left) present.  Skin:    General: Skin is warm and dry.     Capillary Refill: Capillary refill takes less than 2 seconds.     Findings: No rash.  Neurological:     Mental Status: She is alert and oriented to person, place, and time.  Psychiatric:        Behavior: Behavior normal.      Vitals:   01/23/22 0930  BP: 124/66  Pulse: 63  Temp: 98.4 F (36.9 C)  TempSrc: Oral  SpO2: 96%  Weight: 172 lb 6.4 oz (78.2 kg)  Height: 5\' 4"  (1.626 m)   96% on RA BMI Readings from Last 3 Encounters:  01/23/22 29.59 kg/m  11/10/19 31.22 kg/m   Wt Readings from Last 3 Encounters:  01/23/22 172 lb 6.4 oz (78.2 kg)  11/10/19 170 lb 11.2 oz (77.4 kg)     CBC No results found for: "WBC", "RBC", "HGB", "HCT", "PLT", "MCV", "MCH", "MCHC", "RDW", "LYMPHSABS", "MONOABS", "EOSABS", "BASOSABS"  Chest Imaging:  July 2023 lung cancer screening CT: AP window adenopathy, lung nodule in the left upper lobe Evidence of emphysema  Pulmonary Functions Testing Results:     No data to display          FeNO:   Pathology:   Echocardiogram:   Heart Catheterization:     Assessment & Plan:     ICD-10-CM   1. Lung nodule  R91.1 NM PET Image Initial (PI) Skull Base To Thigh    2. Adenopathy  R59.9 NM PET Image Initial (PI) Skull Base To Thigh    3. Abnormal CT lung screening  R91.8     4. Cervical adenopathy  R59.0       Discussion:  This is a 76 year old female referred for left upper lobe lung nodule found to have an enlarged AP window lymph node.  Longstanding history of tobacco use.  She also has a palpable left cervical lymph node.  Plan: I think next best step would be  including a nuclear medicine PET scan. Pending on her PET scan results we will likely be able to refer her to interventional radiology for cervical lymph node biopsy. I expect we are dealing with an advanced age malignancy. If we need to biopsy the small lung nodule in the future we can do such.  But I do believe we need to figure out what is going on with the  cervical lymph node as well as the AP window lymph node first.  They could all be related to a lung cancer however we could be dealing with separate processes  The PET scan will help [place] delineate next best location to biopsy  Follow-up with me or SG, NP in 2 weeks after PET scan.   Current Outpatient Medications:    indapamide (LOZOL) 1.25 MG tablet, Take 1.25 mg by mouth daily., Disp: , Rfl:    rosuvastatin (CRESTOR) 10 MG tablet, Take 10 mg by mouth daily., Disp: , Rfl:   I spent 62 minutes dedicated to the care of this patient on the date of this encounter to include pre-visit review of records, face-to-face time with the patient discussing conditions above, post visit ordering of testing, clinical documentation with the electronic health record, making appropriate referrals as documented, and communicating necessary findings to members of the patients care team.   Garner Nash, DO [facility] 01/23/2022 9:55 AM

## 2022-01-25 DIAGNOSIS — C3490 Malignant neoplasm of unspecified part of unspecified bronchus or lung: Secondary | ICD-10-CM

## 2022-01-25 HISTORY — DX: Malignant neoplasm of unspecified part of unspecified bronchus or lung: C34.90

## 2022-02-01 ENCOUNTER — Ambulatory Visit (HOSPITAL_COMMUNITY)
Admission: RE | Admit: 2022-02-01 | Discharge: 2022-02-01 | Disposition: A | Discharge status: Home / Self Care | Payer: Medicare Other | Source: Ambulatory Visit | Attending: Pulmonary Disease | Admitting: Pulmonary Disease

## 2022-02-01 DIAGNOSIS — R911 Solitary pulmonary nodule: Secondary | ICD-10-CM | POA: Diagnosis not present

## 2022-02-01 DIAGNOSIS — J439 Emphysema, unspecified: Secondary | ICD-10-CM | POA: Diagnosis not present

## 2022-02-01 DIAGNOSIS — D3501 Benign neoplasm of right adrenal gland: Secondary | ICD-10-CM | POA: Diagnosis not present

## 2022-02-01 DIAGNOSIS — I700 Atherosclerosis of aorta: Secondary | ICD-10-CM | POA: Insufficient documentation

## 2022-02-01 DIAGNOSIS — R918 Other nonspecific abnormal finding of lung field: Secondary | ICD-10-CM | POA: Diagnosis not present

## 2022-02-01 DIAGNOSIS — R599 Enlarged lymph nodes, unspecified: Secondary | ICD-10-CM | POA: Diagnosis not present

## 2022-02-01 DIAGNOSIS — D381 Neoplasm of uncertain behavior of trachea, bronchus and lung: Secondary | ICD-10-CM | POA: Diagnosis not present

## 2022-02-01 LAB — GLUCOSE, CAPILLARY: Glucose-Capillary: 101 mg/dL — ABNORMAL HIGH (ref 70–99)

## 2022-02-01 MED ORDER — FLUDEOXYGLUCOSE F - 18 (FDG) INJECTION
8.6000 | Freq: Once | INTRAVENOUS | Status: AC
  Administered 2022-02-01: 8.56 via INTRAVENOUS

## 2022-02-05 ENCOUNTER — Encounter: Payer: Self-pay | Admitting: Pulmonary Disease

## 2022-02-06 ENCOUNTER — Encounter: Payer: Self-pay | Admitting: Pulmonary Disease

## 2022-02-06 ENCOUNTER — Ambulatory Visit: Payer: Medicare Other | Admitting: Pulmonary Disease

## 2022-02-06 ENCOUNTER — Telehealth: Payer: Self-pay | Admitting: Internal Medicine

## 2022-02-06 VITALS — BP 120/80 | HR 60 | Ht 64.0 in | Wt 169.8 lb

## 2022-02-06 DIAGNOSIS — R918 Other nonspecific abnormal finding of lung field: Secondary | ICD-10-CM | POA: Insufficient documentation

## 2022-02-06 DIAGNOSIS — C3490 Malignant neoplasm of unspecified part of unspecified bronchus or lung: Secondary | ICD-10-CM

## 2022-02-06 DIAGNOSIS — R590 Localized enlarged lymph nodes: Secondary | ICD-10-CM | POA: Diagnosis not present

## 2022-02-06 NOTE — Progress Notes (Signed)
Synopsis: Referred in August 2023 for lung nodule by [name], MD  Subjective:   PATIENT ID: Yolanda Robertson: female DOB: [birth year], MRN: [id number]  Chief Complaint  Patient presents with   Follow-up    This is a 76 year old female past medical history of hypertension, hyperlipidemia.Patient had a lung cancer screening CT completed on 12/17/2021 which was read as a lung RADS 4X.  Patient was found to have a irregular nodule within the left upper lobe at 11.5 mm.  Was also found to have a AP window lymph node measuring 2.1 cm.  Patient also complaining today of enlarged lymph node in her left neck.  OV 02/06/2022: Here today for follow-up.  Patient had a recent PET CT which revealed hypermetabolic nodes in the cervical chain as well as hypermetabolic lesions within the chest.  We reviewed her PET scan imaging today.    Past Medical History:  Diagnosis Date   Hyperlipidemia    Hypertension      No family history on file.   Past Surgical History:  Procedure Laterality Date   VAGINAL HYSTERECTOMY      Social History   Socioeconomic History   Marital status: Married    Spouse name: Not on file   Number of children: 4   Years of education: Not on file   Highest education level: Not on file  Occupational History   Not on file  Tobacco Use   Smoking status: Former    Packs/day: 1.00    Types: Cigarettes    Quit date: 12/23/2021    Years since quitting: 0.1   Smokeless tobacco: Never  Vaping Use   Vaping Use: Never used  Substance and Sexual Activity   Alcohol use: Never   Drug use: Never   Sexual activity: Yes  Other Topics Concern   Not on file  Social History Narrative   Not on file   Social Determinants of Health   Financial Resource Strain: Not on file  Food Insecurity: Not on file  Transportation Needs: Not on file  Physical Activity: Not on file  Stress: Not on file  Social Connections: Not on file  Intimate Partner Violence: Not on file      No Known Allergies   Outpatient Medications Prior to Visit  Medication Sig Dispense Refill   indapamide (LOZOL) 1.25 MG tablet Take 1.25 mg by mouth daily.     rosuvastatin (CRESTOR) 10 MG tablet Take 10 mg by mouth daily.     No facility-administered medications prior to visit.    Review of Systems  Constitutional:  Negative for chills, fever, malaise/fatigue and weight loss.  HENT:  Negative for hearing loss, sore throat and tinnitus.   Eyes:  Negative for blurred vision and double vision.  Respiratory:  Negative for cough, hemoptysis, sputum production, shortness of breath, wheezing and stridor.   Cardiovascular:  Negative for chest pain, palpitations, orthopnea, leg swelling and PND.  Gastrointestinal:  Negative for abdominal pain, constipation, diarrhea, heartburn, nausea and vomiting.  Genitourinary:  Negative for dysuria, hematuria and urgency.  Musculoskeletal:  Negative for joint pain and myalgias.  Skin:  Negative for itching and rash.  Neurological:  Negative for dizziness, tingling, weakness and headaches.  Endo/Heme/Allergies:  Negative for environmental allergies. Does not bruise/bleed easily.  Psychiatric/Behavioral:  Negative for depression. The patient is not nervous/anxious and does not have insomnia.   All other systems reviewed and are negative.    Objective:  Physical Exam Vitals reviewed.  Constitutional:  General: She is not in acute distress.    Appearance: She is well-developed.  HENT:     Head: Normocephalic and atraumatic.  Eyes:     General: No scleral icterus.    Conjunctiva/sclera: Conjunctivae normal.     Pupils: Pupils are equal, round, and reactive to light.  Neck:     Vascular: No JVD.     Trachea: No tracheal deviation.  Cardiovascular:     Rate and Rhythm: Normal rate and regular rhythm.     Heart sounds: Normal heart sounds. No murmur heard. Pulmonary:     Effort: Pulmonary effort is normal. No tachypnea, accessory muscle  usage or respiratory distress.     Breath sounds: No stridor. No wheezing, rhonchi or rales.  Abdominal:     General: There is no distension.     Palpations: Abdomen is soft.     Tenderness: There is no abdominal tenderness.  Musculoskeletal:        General: No tenderness.     Cervical back: Neck supple.  Lymphadenopathy:     Cervical: Cervical adenopathy present.  Skin:    General: Skin is warm and dry.     Capillary Refill: Capillary refill takes less than 2 seconds.     Findings: No rash.  Neurological:     Mental Status: She is alert and oriented to person, place, and time.  Psychiatric:        Behavior: Behavior normal.      Vitals:   02/06/22 0858  BP: 120/80  Pulse: 60  SpO2: 96%  Weight: 169 lb 12.8 oz (77 kg)  Height: 5\' 4"  (1.626 m)   96% on RA BMI Readings from Last 3 Encounters:  02/06/22 29.15 kg/m  01/23/22 29.59 kg/m  11/10/19 31.22 kg/m   Wt Readings from Last 3 Encounters:  02/06/22 169 lb 12.8 oz (77 kg)  01/23/22 172 lb 6.4 oz (78.2 kg)  11/10/19 170 lb 11.2 oz (77.4 kg)     CBC No results found for: "WBC", "RBC", "HGB", "HCT", "PLT", "MCV", "MCH", "MCHC", "RDW", "LYMPHSABS", "MONOABS", "EOSABS", "BASOSABS"  Chest Imaging:  July 2023 lung cancer screening CT: AP window adenopathy, lung nodule in the left upper lobe Evidence of emphysema  Nuclear medicine pet imaging: Hypermetabolic cervical lymph node as well as hypermetabolic lesions within the chest. The patient's images have been independently reviewed by me.    Pulmonary Functions Testing Results:     No data to display          FeNO:   Pathology:   Echocardiogram:   Heart Catheterization:     Assessment & Plan:     ICD-10-CM   1. Lung mass  R91.8 [place] FNA SOFT TISSUE    Ambulatory referral to Interventional Radiology    Ambulatory referral to Hematology / Oncology    Ambulatory referral to [place]    2. Cervical adenopathy   R59.0 [place] FNA SOFT TISSUE    Ambulatory referral to Interventional Radiology    Ambulatory referral to Hematology / Oncology    Ambulatory referral to Radiation Oncology    MR BRAIN W WO CONTRAST    3. 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  Plan: We reviewed her PET scan results today. I have ordered a ultrasound-guided cervical node biopsy. Referral placed to medical oncology as well as radiation oncology. I also ordered a brain MRI to complete staging. RTC in 4 months to see [place].   Current Outpatient Medications:    indapamide (LOZOL) 1.25 MG tablet, Take 1.25 mg by mouth daily., Disp: , Rfl:    rosuvastatin (CRESTOR) 10 MG tablet, Take 10 mg by mouth daily., Disp: , Rfl:    Garner Nash, DO [facility] 02/06/2022 9:25 AM

## 2022-02-06 NOTE — Telephone Encounter (Signed)
Scheduled appt per 9/13 referral. Pt is aware of appt date and time. Pt is aware to arrive 15 mins prior to appt time and to bring and updated insurance card. Pt is aware of appt location.

## 2022-02-06 NOTE — Progress Notes (Unsigned)
Yolanda Peaches, MD  P Ir Procedure Requests; Donita Brooks D Approved for US guided core biopsy of left cervical/parotid mass.  Request from [facility], concern for lung mets.   [place]

## 2022-02-06 NOTE — Addendum Note (Signed)
Addended by: Garner Nash on: 02/06/2022 12:47 PM   Modules accepted: Orders

## 2022-02-06 NOTE — Patient Instructions (Signed)
Thank you for visiting Dr. Valeta Harms at [facility]. Today we recommend the following:  Orders Placed This Encounter  Procedures   [place] FNA SOFT TISSUE   MR BRAIN W [place]   Ambulatory referral to Interventional Radiology   Ambulatory referral to Hematology / Oncology   Ambulatory referral to Radiation Oncology   Return in about 4 months (around 06/08/2022) for with Eric Form, NP, or Dr. Valeta Harms.    Please do your part to reduce the spread of COVID-19.

## 2022-02-06 NOTE — Progress Notes (Signed)
PCCM:  Addendum.  I called and spoke with Dr. Isidore Moos from radiation oncology.  Due to the presentation on the PET scan with a hypermetabolic lesion within the parotid gland and cervical nodes she is concerned that we could be dealing with 2 separate primary malignancies 1 within the chest 1 within the head and neck.  She would like for [place] to go ahead and consider lung biopsy as well.  I called spoke with the patient regarding scheduling bronchoscopy. Patient is agreeable to proceed with bronchoscopy on 02/12/2022. She will need bronchus with endobronchial ultrasound transbronchial needle aspirations of the left hilar mass.  Garner Nash, DO [facility] 02/06/2022 12:47 PM

## 2022-02-07 ENCOUNTER — Other Ambulatory Visit: Payer: Self-pay

## 2022-02-07 DIAGNOSIS — R918 Other nonspecific abnormal finding of lung field: Secondary | ICD-10-CM

## 2022-02-08 ENCOUNTER — Inpatient Hospital Stay: Payer: Medicare Other | Attending: Internal Medicine | Admitting: Internal Medicine

## 2022-02-08 ENCOUNTER — Other Ambulatory Visit: Payer: Self-pay

## 2022-02-08 ENCOUNTER — Inpatient Hospital Stay: Payer: Medicare Other

## 2022-02-08 ENCOUNTER — Encounter (HOSPITAL_COMMUNITY): Payer: Self-pay | Admitting: Pulmonary Disease

## 2022-02-08 ENCOUNTER — Other Ambulatory Visit: Payer: Medicare Other

## 2022-02-08 ENCOUNTER — Encounter: Payer: Self-pay | Admitting: Internal Medicine

## 2022-02-08 ENCOUNTER — Other Ambulatory Visit: Payer: Self-pay | Admitting: *Deleted

## 2022-02-08 VITALS — BP 118/74 | HR 67 | Temp 98.0°F | Resp 18 | Ht 64.0 in | Wt 169.8 lb

## 2022-02-08 DIAGNOSIS — R918 Other nonspecific abnormal finding of lung field: Secondary | ICD-10-CM

## 2022-02-08 DIAGNOSIS — D3501 Benign neoplasm of right adrenal gland: Secondary | ICD-10-CM | POA: Insufficient documentation

## 2022-02-08 DIAGNOSIS — R0609 Other forms of dyspnea: Secondary | ICD-10-CM | POA: Insufficient documentation

## 2022-02-08 DIAGNOSIS — J439 Emphysema, unspecified: Secondary | ICD-10-CM | POA: Diagnosis not present

## 2022-02-08 DIAGNOSIS — I700 Atherosclerosis of aorta: Secondary | ICD-10-CM | POA: Diagnosis not present

## 2022-02-08 DIAGNOSIS — C3412 Malignant neoplasm of upper lobe, left bronchus or lung: Secondary | ICD-10-CM | POA: Insufficient documentation

## 2022-02-08 DIAGNOSIS — E785 Hyperlipidemia, unspecified: Secondary | ICD-10-CM | POA: Insufficient documentation

## 2022-02-08 DIAGNOSIS — Z87891 Personal history of nicotine dependence: Secondary | ICD-10-CM | POA: Insufficient documentation

## 2022-02-08 DIAGNOSIS — I10 Essential (primary) hypertension: Secondary | ICD-10-CM | POA: Diagnosis not present

## 2022-02-08 DIAGNOSIS — Z01818 Encounter for other preprocedural examination: Secondary | ICD-10-CM | POA: Diagnosis not present

## 2022-02-08 DIAGNOSIS — R5383 Other fatigue: Secondary | ICD-10-CM | POA: Insufficient documentation

## 2022-02-08 DIAGNOSIS — Z8249 Family history of ischemic heart disease and other diseases of the circulatory system: Secondary | ICD-10-CM | POA: Insufficient documentation

## 2022-02-08 DIAGNOSIS — Z79899 Other long term (current) drug therapy: Secondary | ICD-10-CM | POA: Diagnosis not present

## 2022-02-08 DIAGNOSIS — Z836 Family history of other diseases of the respiratory system: Secondary | ICD-10-CM | POA: Diagnosis not present

## 2022-02-08 DIAGNOSIS — R590 Localized enlarged lymph nodes: Secondary | ICD-10-CM | POA: Diagnosis not present

## 2022-02-08 DIAGNOSIS — M47812 Spondylosis without myelopathy or radiculopathy, cervical region: Secondary | ICD-10-CM | POA: Insufficient documentation

## 2022-02-08 LAB — CMP (CANCER CENTER ONLY)
ALT: 18 U/L (ref 0–44)
AST: 19 U/L (ref 15–41)
Albumin: 4.2 g/dL (ref 3.5–5.0)
Alkaline Phosphatase: 56 U/L (ref 38–126)
Anion gap: 6 (ref 5–15)
BUN: 32 mg/dL — ABNORMAL HIGH (ref 8–23)
CO2: 27 mmol/L (ref 22–32)
Calcium: 9.6 mg/dL (ref 8.9–10.3)
Chloride: 106 mmol/L (ref 98–111)
Creatinine: 1.23 mg/dL — ABNORMAL HIGH (ref 0.44–1.00)
GFR, Estimated: 46 mL/min — ABNORMAL LOW (ref >60)
Glucose, Bld: 98 mg/dL (ref 70–99)
Potassium: 5.2 mmol/L — ABNORMAL HIGH (ref 3.5–5.1)
Sodium: 139 mmol/L (ref 135–145)
Total Bilirubin: 0.7 mg/dL (ref 0.3–1.2)
Total Protein: 7.7 g/dL (ref 6.5–8.1)

## 2022-02-08 LAB — CBC WITH DIFFERENTIAL (CANCER CENTER ONLY)
Abs Immature Granulocytes: 0.01 10*3/uL (ref 0.00–0.07)
Basophils Absolute: 0 10*3/uL (ref 0.0–0.1)
Basophils Relative: 1 %
Eosinophils Absolute: 0.3 10*3/uL (ref 0.0–0.5)
Eosinophils Relative: 6 %
HCT: 37.1 % (ref 36.0–46.0)
Hemoglobin: 12.1 g/dL (ref 12.0–15.0)
Immature Granulocytes: 0 %
Lymphocytes Relative: 38 %
Lymphs Abs: 1.7 10*3/uL (ref 0.7–4.0)
MCH: 28.7 pg (ref 26.0–34.0)
MCHC: 32.6 g/dL (ref 30.0–36.0)
MCV: 87.9 fL (ref 80.0–100.0)
Monocytes Absolute: 0.4 10*3/uL (ref 0.1–1.0)
Monocytes Relative: 9 %
Neutro Abs: 2.1 10*3/uL (ref 1.7–7.7)
Neutrophils Relative %: 46 %
Platelet Count: 247 10*3/uL (ref 150–400)
RBC: 4.22 MIL/uL (ref 3.87–5.11)
RDW: 13.4 % (ref 11.5–15.5)
WBC Count: 4.4 10*3/uL (ref 4.0–10.5)
nRBC: 0 % (ref 0.0–0.2)

## 2022-02-08 NOTE — Progress Notes (Signed)
[facility] Telephone:([phone]) [phone]   [phone]  CONSULT NOTE  REFERRING PHYSICIAN: Dr. Leory Plowman Icard  REASON FOR CONSULTATION:  76 years old African-American female with highly suspicious lung cancer.  HPI Yolanda Robertson is a 76 y.o. female with past medical history significant for hypertension and dyslipidemia.  The patient also has a long history of smoking and she was followed by the CT chest screening for lung cancer and she had a scan on December 14, 2021 and found to have a suspicious left upper lobe lung nodule measuring 1.2 cm with suspicious AP window lymphadenopathy of 2.1 cm.  The patient also noted enlarged lymph node in the left neck area.  She was referred to Dr. Valeta Harms for evaluation.  A PET scan was performed on 02/01/2022 and it showed 1.0 cm anterior left upper lobe pulmonary nodule that is hypermetabolic with SUV max of 7.4.  There was also markedly hypermetabolic 2.6 cm short axis AP window lymph node, left hilar mass/lymphadenopathy measuring 3.6 cm with SUV max of 22.0.  There was a 3.2 cm right adrenal mass with low attenuation and consistent with benign adrenal adenoma.  There is no evidence for distant metastatic disease or skeletal metastasis seen on the PET scan.  She is scheduled for bronchoscopy with EBUS under the care of Dr. Valeta Harms on 02/12/2022.  Dr. Valeta Harms referred the patient to interventional radiology for consideration of ultrasound-guided fine-needle aspiration of the salivary gland.  This is scheduled to be done on 02/20/2022.  She also has MRI of the brain scheduled to be done tomorrow. The patient is here today for evaluation and recommendation regarding her condition. When seen today the patient has no concerning complaints.  She denied having any chest pain, shortness of breath, cough or hemoptysis.  She denied having any fever or chills.  She has no nausea, vomiting, diarrhea or constipation.  She has no headache or visual changes.  She  has no recent weight loss or night sweats. Family history significant for father with COPD and heart disease.  Mother died from bone cancer. The patient is married and has 4 children 3 daughters and 1 son.  She used to work at [employer] for [employer].  The patient has a history of smoking 1 pack/day for around 55 years and quit 3 months ago.  She has no history of alcohol or drug abuse.  HPI  Past Medical History:  Diagnosis Date   Hyperlipidemia    Hypertension     Past Surgical History:  Procedure Laterality Date   VAGINAL HYSTERECTOMY      No family history on file.  Social History Social History   Tobacco Use   Smoking status: Former    Packs/day: 1.00    Types: Cigarettes    Quit date: 12/23/2021    Years since quitting: 0.1   Smokeless tobacco: Never  Vaping Use   Vaping Use: Never used  Substance Use Topics   Alcohol use: Never   Drug use: Never    No Known Allergies  Current Outpatient Medications  Medication Sig Dispense Refill   ibuprofen (ADVIL) 200 MG tablet Take 200 mg by mouth every 8 (eight) hours as needed (pain.).     indapamide (LOZOL) 1.25 MG tablet Take 1.25 mg by mouth in the morning.     Polyvinyl Alcohol-Povidone PF (REFRESH) 1.4-0.6 % SOLN Place 1-2 drops into both eyes 3 (three) times daily as needed (dry/irritated eyes.).  rosuvastatin (CRESTOR) 10 MG tablet Take 10 mg by mouth in the morning.     No current facility-administered medications for this visit.    Review of Systems  Constitutional: negative Eyes: negative Ears, nose, mouth, throat, and face: negative Respiratory: negative Cardiovascular: negative Gastrointestinal: negative Genitourinary:negative Integument/breast: negative Hematologic/lymphatic: negative Musculoskeletal:negative Neurological: negative Behavioral/Psych: negative Endocrine: negative Allergic/Immunologic: negative  Physical Exam  VZC:HYIFO, healthy, no  distress, well nourished, and well developed SKIN: skin color, texture, turgor are normal, no rashes or significant lesions HEAD: Normocephalic, No masses, lesions, tenderness or abnormalities EYES: normal, PERRLA, Conjunctiva are pink and non-injected EARS: External ears normal, Canals clear OROPHARYNX:no exudate, no erythema, and lips, buccal mucosa, and tongue normal  NECK: Palpable left salivary gland. LYMPH:  no palpable lymphadenopathy, no hepatosplenomegaly BREAST:not examined LUNGS: clear to auscultation , and palpation HEART: regular rate & rhythm, no murmurs, and no gallops ABDOMEN:abdomen soft, non-tender, normal bowel sounds, and no masses or organomegaly BACK: Back symmetric, no curvature., No CVA tenderness EXTREMITIES:no joint deformities, effusion, or inflammation, no edema  NEURO: alert & oriented x 3 with fluent speech, no focal motor/sensory deficits  PERFORMANCE STATUS: ECOG 1  LABORATORY DATA: No results found for: "WBC", "HGB", "HCT", "MCV", "PLT"    Chemistry   No results found for: "NA", "K", "CL", "CO2", "BUN", "CREATININE", "GLU" No results found for: "CALCIUM", "ALKPHOS", "AST", "ALT", "BILITOT"     RADIOGRAPHIC STUDIES: NM PET Image Initial (PI) Skull Base To Thigh  Result Date: 02/02/2022 CLINICAL DATA:  Initial treatment strategy for pulmonary nodule. EXAM: NUCLEAR MEDICINE PET SKULL BASE TO THIGH TECHNIQUE: 8.9 mCi F-18 FDG was injected intravenously. Full-ring PET imaging was performed from the skull base to thigh after the radiotracer. CT data was obtained and used for attenuation correction and anatomic localization. Fasting blood glucose: 101 mg/dl COMPARISON:  Lung cancer screening chest CT 12/17/2021 FINDINGS: Mediastinal blood pool activity: SUV max 2.9 Liver activity: SUV max NA NECK: The 17 mm short axis left cervical node versus left parotid node/nodule (image 24/4) is markedly hypermetabolic with SUV max = 27.7. High level II left-sided lymph node  measuring 9 mm short axis on 13/4 demonstrates SUV max = 10.2. Symmetric hypermetabolism is identified in the tonsillar regions bilaterally. Incidental CT findings: None. CHEST: 10 mm anterior left upper lobe pulmonary nodule on image 41/2 is hypermetabolic with SUV max = 7.4. Markedly hypermetabolic 2.6 cm short axis AP window node on 62/4 demonstrates SUV max = 20.4. Left hilar mass/lymphadenopathy measuring 3.6 cm on 69/4 demonstrates SUV max = 22.0. No hypermetabolic right hilar disease. No hypermetabolic lymphadenopathy in the axillary regions. Incidental CT findings: There is mild atherosclerotic calcification of the abdominal aorta without aneurysm. Centrilobular emphsyema noted. ABDOMEN/PELVIS: No abnormal hypermetabolic activity within the liver, pancreas, adrenal glands, or spleen. No hypermetabolic lymph nodes in the abdomen or pelvis. Incidental CT findings: 3.2 cm right adrenal mass has low attenuation (0 Hounsfield units) consistent with benign adrenal adenoma. No hypermetabolism on PET imaging. There is mild atherosclerotic calcification of the abdominal aorta without aneurysm. SKELETON: No focal hypermetabolic activity to suggest skeletal metastasis. Incidental CT findings: No worrisome lytic or sclerotic osseous abnormality. IMPRESSION: 1. Hypermetabolic left cervical, mediastinal, and left hilar nodal disease compatible with metastases or lymphoproliferative disorder. 2. Small anterior left upper lobe pulmonary nodule is hypermetabolic. This could represent primary neoplasm or metastatic disease. 3. Symmetric uptake in the tonsillar regions of the oropharynx, nonspecific. Direct visualization may be warranted to exclude mucosal lesion. 4. 3.2 cm benign  right adrenal adenoma. 5.  Aortic Atherosclerois (ICD10-170.0) 6.  Emphysema. (OAC16-S06.9) Electronically Signed   By: Misty Stanley M.D.   On: 02/02/2022 10:16    ASSESSMENT: This is a very pleasant 76 years old African-American female with  likely stage IIIb (T1b, N3, M0) lung cancer pending tissue diagnosis and complete staging work-up.  She presented with left upper lobe lung nodule in addition to left hilar and mediastinal as well as left cervical lymphadenopathy.   PLAN: I had a lengthy discussion with the patient today about her current condition and further investigation to confirm her diagnosis and possible treatment options. I personally and independently reviewed the imaging studies and discussed the result with the patient today. I recommended for the patient to proceed with the MRI of the brain as well as bronchoscopy as recommended by Dr. Valeta Harms. She is also scheduled for core biopsy of the left salivary gland on 02/20/2022. I explained to the patient that if the final pathology is consistent with non-small cell lung cancer and no evidence of metastatic disease, she may benefit from a course of concurrent chemoradiation with weekly carboplatin for AUC of 2 and paclitaxel 45 Mg/M2 followed by immunotherapy if no evidence for disease progression. I will refer the patient to radiation oncology for evaluation and discussion of the radiotherapy option. I will see the patient back for follow-up visit in around 2 weeks for evaluation and more detailed discussion of her treatment options based on the final pathology and staging work-up. I strongly encouraged the patient to continue with the smoking cessation. She was advised to call immediately if she has any other concerning symptoms in the interval.  The patient voices understanding of current disease status and treatment options and is in agreement with the current care plan.  All questions were answered. The patient knows to call the clinic with any problems, questions or concerns. We can certainly see the patient much sooner if necessary.  Thank you so much for allowing me to participate in the care of [place]. I will continue to follow up the patient with you and  assist in her care.  The total time spent in the appointment was 60 minutes.  Disclaimer: This note was dictated with voice recognition software. Similar sounding words can inadvertently be transcribed and may not be corrected upon review.   Eilleen Kempf February 08, 2022, 8:55 AM

## 2022-02-09 ENCOUNTER — Ambulatory Visit (HOSPITAL_COMMUNITY)
Admission: RE | Admit: 2022-02-09 | Discharge: 2022-02-09 | Disposition: A | Discharge status: Home / Self Care | Payer: Medicare Other | Source: Ambulatory Visit | Attending: Pulmonary Disease | Admitting: Pulmonary Disease

## 2022-02-09 DIAGNOSIS — H748X3 Other specified disorders of middle ear and mastoid, bilateral: Secondary | ICD-10-CM | POA: Diagnosis not present

## 2022-02-09 DIAGNOSIS — C3490 Malignant neoplasm of unspecified part of unspecified bronchus or lung: Secondary | ICD-10-CM | POA: Insufficient documentation

## 2022-02-09 DIAGNOSIS — R590 Localized enlarged lymph nodes: Secondary | ICD-10-CM | POA: Diagnosis not present

## 2022-02-09 DIAGNOSIS — R918 Other nonspecific abnormal finding of lung field: Secondary | ICD-10-CM | POA: Diagnosis not present

## 2022-02-09 MED ORDER — GADOBUTROL 1 MMOL/ML IV SOLN
7.5000 mL | Freq: Once | INTRAVENOUS | Status: AC | PRN
  Administered 2022-02-09: 7.5 mL via INTRAVENOUS

## 2022-02-11 ENCOUNTER — Encounter (HOSPITAL_COMMUNITY): Payer: Self-pay | Admitting: Pulmonary Disease

## 2022-02-11 ENCOUNTER — Other Ambulatory Visit: Payer: Self-pay

## 2022-02-11 NOTE — Progress Notes (Unsigned)
Arne Cleveland, MD  Donita Brooks D Ok   [place] core L cerv LAN  R/o met v lymphoma   DDH

## 2022-02-11 NOTE — Progress Notes (Signed)
PCP - Dr. Arlice Colt Cardiologist - Denies EKG -  Chest x-ray -  ECHO -  Cardiac Cath -  CPAP - Denies OSA DM - Denies Blood Thinner Instructions: Denies Aspirin Instructions: Denies COVID TEST- Had Covid test on Saturday at Dr. I Cards office  Anesthesia review: Yes  -------------  SDW INSTRUCTIONS:  Your procedure is scheduled on Tuesday September 19th. Please report to [facility] Main Entrance "A" at 1015 A.M., and check in at the Admitting office. Call this number if you have problems the morning of surgery: [phone]   Remember: Do not eat or drink after midnight the night before your surgery  Medications to take morning of surgery with a sip of water include: Crestor  As of today, STOP taking any Aspirin (unless otherwise instructed by your surgeon), Aleve, Naproxen, Ibuprofen, Motrin, Advil, Goody's, BC's, all herbal medications, fish oil, and all vitamins.    The Morning of Surgery Do not wear jewelry, make-up or nail polish. Do not wear lotions, powders, or perfumes/colognes, or deodorant Do not bring valuables to the hospital. [facility] is not responsible for any belongings or valuables.  If you are a smoker, DO NOT Smoke 24 hours prior to surgery  If you wear a CPAP at night please bring your mask the morning of surgery   Remember that you must have someone to transport you home after your surgery, and remain with you for 24 hours if you are discharged the same day.  Please bring cases for contacts, glasses, hearing aids, dentures or bridgework because it cannot be worn into surgery.   Patients discharged the day of surgery will not be allowed to drive home.   Please shower the NIGHT BEFORE/MORNING OF SURGERY (use antibacterial soap like DIAL soap if possible). Wear comfortable clothes the morning of surgery. Oral Hygiene is also important to reduce your risk of infection.  Remember - BRUSH YOUR TEETH THE MORNING OF SURGERY WITH YOUR REGULAR  TOOTHPASTE  Patient denies shortness of breath, fever, cough and chest pain.

## 2022-02-12 ENCOUNTER — Encounter (HOSPITAL_COMMUNITY): Payer: Self-pay | Admitting: Pulmonary Disease

## 2022-02-12 ENCOUNTER — Encounter (HOSPITAL_COMMUNITY)
Admission: RE | Disposition: A | Discharge status: Home / Self Care | Payer: Self-pay | Source: Ambulatory Visit | Attending: Pulmonary Disease

## 2022-02-12 ENCOUNTER — Ambulatory Visit (HOSPITAL_COMMUNITY)
Admission: RE | Admit: 2022-02-12 | Discharge: 2022-02-12 | Disposition: A | Discharge status: Home / Self Care | Payer: Medicare Other | Source: Ambulatory Visit | Attending: Pulmonary Disease | Admitting: Pulmonary Disease

## 2022-02-12 ENCOUNTER — Ambulatory Visit (HOSPITAL_BASED_OUTPATIENT_CLINIC_OR_DEPARTMENT_OTHER): Payer: Medicare Other | Admitting: Physician Assistant

## 2022-02-12 ENCOUNTER — Ambulatory Visit (HOSPITAL_COMMUNITY): Payer: Medicare Other | Admitting: Physician Assistant

## 2022-02-12 DIAGNOSIS — I10 Essential (primary) hypertension: Secondary | ICD-10-CM | POA: Diagnosis not present

## 2022-02-12 DIAGNOSIS — R918 Other nonspecific abnormal finding of lung field: Secondary | ICD-10-CM | POA: Diagnosis present

## 2022-02-12 DIAGNOSIS — R599 Enlarged lymph nodes, unspecified: Secondary | ICD-10-CM | POA: Insufficient documentation

## 2022-02-12 DIAGNOSIS — E785 Hyperlipidemia, unspecified: Secondary | ICD-10-CM | POA: Insufficient documentation

## 2022-02-12 DIAGNOSIS — Z87891 Personal history of nicotine dependence: Secondary | ICD-10-CM

## 2022-02-12 DIAGNOSIS — R846 Abnormal cytological findings in specimens from respiratory organs and thorax: Secondary | ICD-10-CM | POA: Insufficient documentation

## 2022-02-12 DIAGNOSIS — C3492 Malignant neoplasm of unspecified part of left bronchus or lung: Secondary | ICD-10-CM | POA: Diagnosis not present

## 2022-02-12 DIAGNOSIS — C3490 Malignant neoplasm of unspecified part of unspecified bronchus or lung: Secondary | ICD-10-CM | POA: Diagnosis not present

## 2022-02-12 HISTORY — PX: FINE NEEDLE ASPIRATION: SHX5430

## 2022-02-12 HISTORY — PX: VIDEO BRONCHOSCOPY WITH ENDOBRONCHIAL ULTRASOUND: SHX6177

## 2022-02-12 HISTORY — DX: Other nonspecific abnormal finding of lung field: R91.8

## 2022-02-12 LAB — NOVEL CORONAVIRUS, NAA: SARS-CoV-2, NAA: NOT DETECTED

## 2022-02-12 LAB — SPECIMEN STATUS REPORT

## 2022-02-12 SURGERY — BRONCHOSCOPY, WITH EBUS
Anesthesia: General | Laterality: Left

## 2022-02-12 MED ORDER — PROMETHAZINE HCL 25 MG/ML IJ SOLN: 6.2500 mg | INTRAMUSCULAR | Status: DC | PRN

## 2022-02-12 MED ORDER — SUGAMMADEX SODIUM 200 MG/2ML IV SOLN
INTRAVENOUS | Status: DC | PRN
  Administered 2022-02-12: 150 mg via INTRAVENOUS
  Administered 2022-02-12: 200 mg via INTRAVENOUS

## 2022-02-12 MED ORDER — CHLORHEXIDINE GLUCONATE 0.12 % MT SOLN
OROMUCOSAL | Status: AC
  Filled 2022-02-12: qty 15

## 2022-02-12 MED ORDER — LIDOCAINE 2% (20 MG/ML) 5 ML SYRINGE
INTRAMUSCULAR | Status: DC | PRN
  Administered 2022-02-12: 20 mg via INTRAVENOUS

## 2022-02-12 MED ORDER — DEXAMETHASONE SODIUM PHOSPHATE 10 MG/ML IJ SOLN
INTRAMUSCULAR | Status: DC | PRN
  Administered 2022-02-12: 10 mg via INTRAVENOUS

## 2022-02-12 MED ORDER — EPHEDRINE SULFATE-NACL 50-0.9 MG/10ML-% IV SOSY
PREFILLED_SYRINGE | INTRAVENOUS | Status: DC | PRN
  Administered 2022-02-12: 5 mg via INTRAVENOUS

## 2022-02-12 MED ORDER — PROPOFOL 500 MG/50ML IV EMUL
INTRAVENOUS | Status: DC | PRN
  Administered 2022-02-12: 100 ug/kg/min via INTRAVENOUS

## 2022-02-12 MED ORDER — ACETAMINOPHEN 500 MG PO TABS: 1000.0000 mg | ORAL_TABLET | Freq: Once | ORAL | Status: AC

## 2022-02-12 MED ORDER — ONDANSETRON HCL 4 MG/2ML IJ SOLN
INTRAMUSCULAR | Status: DC | PRN
  Administered 2022-02-12: 4 mg via INTRAVENOUS

## 2022-02-12 MED ORDER — CHLORHEXIDINE GLUCONATE 0.12 % MT SOLN
15.0000 mL | Freq: Once | OROMUCOSAL | Status: DC
  Filled 2022-02-12: qty 15

## 2022-02-12 MED ORDER — ACETAMINOPHEN 500 MG PO TABS
ORAL_TABLET | ORAL | Status: AC
  Administered 2022-02-12: 1000 mg via ORAL
  Filled 2022-02-12: qty 2

## 2022-02-12 MED ORDER — ALBUTEROL SULFATE HFA 108 (90 BASE) MCG/ACT IN AERS
INHALATION_SPRAY | RESPIRATORY_TRACT | Status: DC | PRN
  Administered 2022-02-12: 5 via RESPIRATORY_TRACT

## 2022-02-12 MED ORDER — ROCURONIUM BROMIDE 10 MG/ML (PF) SYRINGE
PREFILLED_SYRINGE | INTRAVENOUS | Status: DC | PRN
  Administered 2022-02-12: 50 mg via INTRAVENOUS

## 2022-02-12 MED ORDER — LACTATED RINGERS IV SOLN: INTRAVENOUS | Status: DC

## 2022-02-12 MED ORDER — OXYCODONE HCL 5 MG/5ML PO SOLN: 5.0000 mg | Freq: Once | ORAL | Status: DC | PRN

## 2022-02-12 MED ORDER — FENTANYL CITRATE (PF) 100 MCG/2ML IJ SOLN: 25.0000 ug | INTRAMUSCULAR | Status: DC | PRN

## 2022-02-12 MED ORDER — OXYCODONE HCL 5 MG PO TABS: 5.0000 mg | ORAL_TABLET | Freq: Once | ORAL | Status: DC | PRN

## 2022-02-12 MED ORDER — MEPERIDINE HCL 25 MG/ML IJ SOLN: 6.2500 mg | INTRAMUSCULAR | Status: DC | PRN

## 2022-02-12 MED ORDER — FENTANYL CITRATE (PF) 250 MCG/5ML IJ SOLN
INTRAMUSCULAR | Status: DC | PRN
  Administered 2022-02-12: 50 ug via INTRAVENOUS

## 2022-02-12 MED ORDER — PROPOFOL 10 MG/ML IV BOLUS
INTRAVENOUS | Status: DC | PRN
  Administered 2022-02-12: 100 mg via INTRAVENOUS

## 2022-02-12 MED ORDER — MIDAZOLAM HCL 2 MG/2ML IJ SOLN: 0.5000 mg | Freq: Once | INTRAMUSCULAR | Status: DC | PRN

## 2022-02-12 SURGICAL SUPPLY — 30 items
BRUSH CYTOL CELLEBRITY 1.5X140 (MISCELLANEOUS) IMPLANT
CANISTER SUCT 3000ML PPV (MISCELLANEOUS) ×2 IMPLANT
CONT SPEC 4OZ CLIKSEAL STRL BL (MISCELLANEOUS) ×2 IMPLANT
COVER BACK TABLE 60X90IN (DRAPES) ×2 IMPLANT
COVER DOME SNAP 22 D (MISCELLANEOUS) ×2 IMPLANT
FORCEPS BIOP RJ4 1.8 (CUTTING FORCEPS) IMPLANT
GAUZE SPONGE 4X4 12PLY STRL (GAUZE/BANDAGES/DRESSINGS) ×2 IMPLANT
GLOVE BIO SURGEON STRL SZ7.5 (GLOVE) ×2 IMPLANT
GOWN STRL REUS W/ TWL LRG LVL3 (GOWN DISPOSABLE) ×2 IMPLANT
GOWN STRL REUS W/TWL LRG LVL3 (GOWN DISPOSABLE) ×2
KIT CLEAN ENDO COMPLIANCE (KITS) ×4 IMPLANT
KIT TURNOVER KIT B (KITS) ×2 IMPLANT
MARKER SKIN DUAL TIP RULER LAB (MISCELLANEOUS) ×2 IMPLANT
NDL EBUS SONO TIP PENTAX (NEEDLE) ×2 IMPLANT
NEEDLE EBUS SONO TIP PENTAX (NEEDLE) ×2 IMPLANT
NS IRRIG 1000ML POUR BTL (IV SOLUTION) ×2 IMPLANT
OIL SILICONE PENTAX (PARTS (SERVICE/REPAIRS)) ×2 IMPLANT
PAD ARMBOARD 7.5X6 YLW CONV (MISCELLANEOUS) ×4 IMPLANT
SOL ANTI FOG 6CC (MISCELLANEOUS) ×2 IMPLANT
SOLUTION ANTI FOG 6CC (MISCELLANEOUS) ×2
SYR 20CC LL (SYRINGE) ×4 IMPLANT
SYR 20ML ECCENTRIC (SYRINGE) ×4 IMPLANT
SYR 50ML SLIP (SYRINGE) IMPLANT
SYR 5ML LUER SLIP (SYRINGE) ×2 IMPLANT
TOWEL OR 17X24 6PK STRL BLUE (TOWEL DISPOSABLE) ×2 IMPLANT
TRAP SPECIMEN MUCOUS 40CC (MISCELLANEOUS) IMPLANT
TUBE CONNECTING 20X1/4 (TUBING) ×4 IMPLANT
UNDERPAD 30X30 (UNDERPADS AND DIAPERS) ×2 IMPLANT
VALVE DISPOSABLE (MISCELLANEOUS) ×2 IMPLANT
WATER STERILE IRR 1000ML POUR (IV SOLUTION) ×2 IMPLANT

## 2022-02-12 NOTE — Op Note (Signed)
Video Bronchoscopy with Endobronchial Ultrasound Procedure Note  Date of Operation: 02/12/2022  Pre-op Diagnosis: Left lung mass  Post-op Diagnosis: Left lung mass  Surgeon: Garner Nash, DO   Assistants: None  Anesthesia: General endotracheal anesthesia  Operation: Flexible video fiberoptic bronchoscopy with endobronchial ultrasound and biopsies.  Estimated Blood Loss: Minimal  Complications: None   Indications and History: Yolanda Robertson is a 76 y.o. female with left lung mass.  The risks, benefits, complications, treatment options and expected outcomes were discussed with the patient.  The possibilities of pneumothorax, pneumonia, reaction to medication, pulmonary aspiration, perforation of a viscus, bleeding, failure to diagnose a condition and creating a complication requiring transfusion or operation were discussed with the patient who freely signed the consent.    Description of Procedure: The patient was examined in the preoperative area and history and data from the preprocedure consultation were reviewed. It was deemed appropriate to proceed.  The patient was taken to [facility], identified as Yolanda Robertson and the procedure verified as Flexible Video Fiberoptic Bronchoscopy.  A Time Out was held and the above information confirmed. After being taken to the operating room general anesthesia was initiated and the patient  was orally intubated. The video fiberoptic bronchoscope was introduced via the endotracheal tube and a general inspection was performed which showed normal right and left lung anatomy, splayed carina on the left lung. The standard scope was then withdrawn and the endobronchial ultrasound was used to identify and characterize the peritracheal, hilar and bronchial lymph nodes. Inspection showed large left hilar mass. Using real-time ultrasound guidance Wang needle biopsies were take from left lung mass and were sent for cytology. The patient tolerated  the procedure well without apparent complications. There was no significant blood loss. The bronchoscope was withdrawn. Anesthesia was reversed and the patient was taken to the PACU for recovery.   Samples: 1. Wang needle biopsies from left lung mass  Plans:  The patient will be discharged from the PACU to home when recovered from anesthesia. We will review the cytology, pathology and results with the patient when they become available. Outpatient followup will be with Garner Nash, DO and Dr. Julien Nordmann, MD   Garner Nash, DO [facility] 02/12/2022 12:45 PM

## 2022-02-12 NOTE — Anesthesia Preprocedure Evaluation (Addendum)
Anesthesia Evaluation  Patient identified by MRN, date of birth, ID band Patient awake    Reviewed: Allergy & Precautions, NPO status , Patient's Chart, lab work & pertinent test results  History of Anesthesia Complications Negative for: history of anesthetic complications  Airway Mallampati: II  TM Distance: >3 FB Neck ROM: Full    Dental  (+) Missing, Dental Advisory Given, Chipped   Pulmonary former smoker,  LUL lung mass   breath sounds clear to auscultation       Cardiovascular hypertension, (-) angina Rhythm:Regular Rate:Normal     Neuro/Psych negative neurological ROS     GI/Hepatic negative GI ROS, Neg liver ROS,   Endo/Other  negative endocrine ROS  Renal/GU negative Renal ROS     Musculoskeletal   Abdominal   Peds  Hematology negative hematology ROS (+)   Anesthesia Other Findings   Reproductive/Obstetrics                            Anesthesia Physical Anesthesia Plan  ASA: 3  Anesthesia Plan: General   Post-op Pain Management: Tylenol PO (pre-op)*   Induction: Intravenous  PONV Risk Score and Plan: 3 and Ondansetron, Dexamethasone and Treatment may vary due to age or medical condition  Airway Management Planned: Oral ETT  Additional Equipment: None  Intra-op Plan:   Post-operative Plan: Extubation in OR  Informed Consent: I have reviewed the patients History and Physical, chart, labs and discussed the procedure including the risks, benefits and alternatives for the proposed anesthesia with the patient or authorized representative who has indicated his/her understanding and acceptance.     Dental advisory given  Plan Discussed with: CRNA and Surgeon  Anesthesia Plan Comments:        Anesthesia Quick Evaluation

## 2022-02-12 NOTE — Discharge Instructions (Signed)
Flexible Bronchoscopy, Care After This sheet gives you information about how to care for yourself after your test. Your doctor may also give you more specific instructions. If you have problems or questions, contact your doctor. Follow these instructions at home: Eating and drinking Do not eat or drink anything (not even water) for 2 hours after your test, or until your numbing medicine (local anesthetic) wears off. When your numbness is gone and your cough and gag reflexes have come back, you may: Eat only soft foods. Slowly drink liquids. The day after the test, go back to your normal diet. Driving Do not drive for 24 hours if you were given a medicine to help you relax (sedative). Do not drive or use heavy machinery while taking prescription pain medicine. General instructions  Take over-the-counter and prescription medicines only as told by your doctor. Return to your normal activities as told. Ask what activities are safe for you. Do not use any products that have nicotine or tobacco in them. This includes cigarettes and e-cigarettes. If you need help quitting, ask your doctor. Keep all follow-up visits as told by your doctor. This is important. It is very important if you had a tissue sample (biopsy) taken. Get help right away if: You have shortness of breath that gets worse. You get light-headed. You feel like you are going to pass out (faint). You have chest pain. You cough up: More than a little blood. More blood than before. Summary Do not eat or drink anything (not even water) for 2 hours after your test, or until your numbing medicine wears off. Do not use cigarettes. Do not use e-cigarettes. Get help right away if you have chest pain.  This information is not intended to replace advice given to you by your health care provider. Make sure you discuss any questions you have with your health care provider. Document Released: 03/10/2009 Document Revised: 04/25/2017 Document  Reviewed: 05/31/2016 Elsevier Patient Education  2020 [employer].

## 2022-02-12 NOTE — Transfer of Care (Signed)
Immediate Anesthesia Transfer of Care Note  Patient: Yolanda Robertson  Procedure(s) Performed: VIDEO BRONCHOSCOPY WITH ENDOBRONCHIAL ULTRASOUND (Left) FINE NEEDLE ASPIRATION (FNA) LINEAR  Patient Location: PACU  Anesthesia Type:General  Level of Consciousness: awake, alert , patient cooperative and responds to stimulation  Airway & Oxygen Therapy: Patient Spontanous Breathing  Post-op Assessment: Report given to RN and Post -op Vital signs reviewed and stable  Post vital signs: Reviewed and stable  Last Vitals:  Vitals Value Taken Time  BP 100/71 02/12/22 1300  Temp    Pulse 63 02/12/22 1301  Resp 15 02/12/22 1301  SpO2 96 % 02/12/22 1301  Vitals shown include unvalidated device data.  Last Pain:  Vitals:   02/12/22 1255  TempSrc:   PainSc: 0-No pain         Complications: No notable events documented.

## 2022-02-12 NOTE — Anesthesia Procedure Notes (Addendum)
Procedure Name: Intubation Date/Time: 02/12/2022 12:15 PM  Performed by: Betha Loa, CRNAPre-anesthesia Checklist: Patient identified, Emergency Drugs available, Suction available and Patient being monitored Patient Re-evaluated:Patient Re-evaluated prior to induction Oxygen Delivery Method: Circle System Utilized Preoxygenation: Pre-oxygenation with 100% oxygen Induction Type: IV induction Ventilation: Mask ventilation without difficulty Laryngoscope Size: Mac and 3 Grade View: Grade III Tube type: Oral Number of attempts: 2 (1st attempt CTM unsuccessful, 2nd attempt successful per CJackson MD with bougie assist) Airway Equipment and Method: Stylet, Oral airway and Bougie stylet Placement Confirmation: ETT inserted through vocal cords under direct vision, positive ETCO2 and breath sounds checked- equal and bilateral Secured at: 23 cm Tube secured with: Tape Dental Injury: Teeth and Oropharynx as per pre-operative assessment  Difficulty Due To: Difficult Airway- due to dentition and Difficult Airway- due to anterior larynx

## 2022-02-12 NOTE — Anesthesia Postprocedure Evaluation (Signed)
Anesthesia Post Note  Patient: Yolanda Robertson  Procedure(s) Performed: VIDEO BRONCHOSCOPY WITH ENDOBRONCHIAL ULTRASOUND (Left) FINE NEEDLE ASPIRATION (FNA) LINEAR     Patient location during evaluation: Phase II Anesthesia Type: General Level of consciousness: awake and alert, patient cooperative and oriented Pain management: pain level controlled Vital Signs Assessment: post-procedure vital signs reviewed and stable Respiratory status: spontaneous breathing, nonlabored ventilation and respiratory function stable Cardiovascular status: blood pressure returned to baseline and stable Postop Assessment: no apparent nausea or vomiting, able to ambulate and adequate PO intake Anesthetic complications: no   No notable events documented.  Last Vitals:  Vitals:   02/12/22 1300 02/12/22 1315  BP: 100/71 111/77  Pulse: 64 63  Resp: 17 17  Temp: (!) 36.4 C (!) 36.4 C  SpO2: 96% 93%    Last Pain:  Vitals:   02/12/22 1315  TempSrc:   PainSc: 0-No pain                 Latasia Silberstein,E. Namira Rosekrans

## 2022-02-12 NOTE — Interval H&P Note (Signed)
History and Physical Interval Note:  02/12/2022 11:58 AM  Yolanda Robertson  has presented today for surgery, with the diagnosis of lung mass.  The various methods of treatment have been discussed with the patient and family. After consideration of risks, benefits and other options for treatment, the patient has consented to  Procedure(s): [place] (Left) as a surgical intervention.  The patient's history has been reviewed, patient examined, no change in status, stable for surgery.  I have reviewed the patient's chart and labs.  Questions were answered to the patient's satisfaction.     [place]

## 2022-02-13 NOTE — Progress Notes (Signed)
Oncology Nurse Navigator Documentation   Ms. Dymond was discussed in ENT conference today. It was recommended that she be referred to [facility], Dr. Constance Holster after her US Guided biopsy on 9/27. I have sent a referral to [facility] via fax with notification of successful transmission. I will follow for an appointment to be scheduled. She will also be scheduled with Dr. Isidore Moos the first or second week of October.   Harlow Asa RN, BSN, OCN Head & Neck Oncology Nurse [place] [facility] Phone # [phone]  Fax # [phone]

## 2022-02-14 ENCOUNTER — Telehealth: Payer: Self-pay | Admitting: Pulmonary Disease

## 2022-02-14 NOTE — Telephone Encounter (Signed)
Sending back to triage because Cherina informed me that Dr Valeta Harms is working [facility] n[facility]

## 2022-02-14 NOTE — Telephone Encounter (Signed)
She has been feeling lightheaded for a couple of days since the procedure and has been taking her blood pressure and its running around 120 on top and 70's  on the bottom. She is trying to stay hydrated and wants to know if this is normal after the procedure and is there anything you recommend? She denies any dizziness, vertigo or other symptoms. Please advise.

## 2022-02-15 LAB — CYTOLOGY - NON PAP

## 2022-02-15 NOTE — Telephone Encounter (Signed)
Called and spoke with pt to see how she was feeling and pt said that she is feeling much better today. Nothing further needed.

## 2022-02-18 ENCOUNTER — Other Ambulatory Visit: Payer: Self-pay | Admitting: Radiology

## 2022-02-18 DIAGNOSIS — R590 Localized enlarged lymph nodes: Secondary | ICD-10-CM

## 2022-02-18 NOTE — Progress Notes (Signed)
Thoracic Location of Tumor / Histology:  Non-small cell carcinoma of left lung, stage 3  Biopsies revealed:  02/20/2022 A. LEFT CERVICAL LYMPH NODE, BIOPSY:  Consistent with a benign Warthin's tumor  02/12/2022 A. 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  The tumor also shows patchy positivity for the pulmonary adeno marker TTF-1. 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   Tobacco/Marijuana/Snuff/ETOH use: Denies any current cigarette or tobacco use (history of 1 pack/day, quit earlier this year). Denies any alcohol consumption or recreational drug use  Past/Anticipated interventions by cardiothoracic surgery, if any:  02/12/2022 --Dr. Leory Plowman Icard Flexible video fiberoptic bronchoscopy with endobronchial ultrasound and biopsies  Past/Anticipated interventions by medical oncology, if any:  Under care of Dr. Curt Bears 02/21/2022 The patient has no actionable mutations and PD-L1 expression is still pending. She had a PET scan as well as MRI of the brain performed recently.   I personally and independently reviewed the scan images and discussed the result with the patient and her husband. The patient also had bronchoscopy by Dr. Valeta Harms and the final pathology was consistent with adenosquamous carcinoma from the left upper lobe lung nodule. She had ultrasound-guided core biopsy of a left cervical lymph node yesterday but the biopsy results are still pending. I had a lengthy discussion with the patient today about her current condition and treatment options. I recommended for the patient a course of concurrent chemoradiation with weekly carboplatin for AUC of 2 and paclitaxel 45 Mg/M2 for 6-7  weeks.  She was referred to Dr. Isidore Moos and has an appointment with her next week.  She will also have consolidation treatment with immunotherapy if there is no evidence for disease progression after the induction phase. I expect the patient to start the first dose of her concurrent chemoradiation on March 04, 2022. I will arrange for the patient to have a chemotherapy education class before the first dose of her treatment. She will come back for follow-up visit in around 2 weeks for evaluation and management of any adverse effect of her treatment. The patient was advised to call immediately if she has any other concerning symptoms in the interval.  Signs/Symptoms Weight changes, if any: Denies Respiratory complaints, if any: Reports occasional dry cough Hemoptysis, if any: None Pain issues, if any:  Denies  SAFETY ISSUES: Prior radiation? No Pacemaker/ICD? No  Possible current pregnancy?No--postmenopausal  Is the patient on methotrexate? No  Current Complaints / other details:  Nothing else of note

## 2022-02-19 ENCOUNTER — Encounter: Payer: Self-pay | Admitting: Primary Care

## 2022-02-19 ENCOUNTER — Other Ambulatory Visit (HOSPITAL_COMMUNITY): Payer: Self-pay | Admitting: Physician Assistant

## 2022-02-19 ENCOUNTER — Ambulatory Visit (INDEPENDENT_AMBULATORY_CARE_PROVIDER_SITE_OTHER): Payer: Medicare Other | Admitting: Primary Care

## 2022-02-19 DIAGNOSIS — R051 Acute cough: Secondary | ICD-10-CM | POA: Diagnosis not present

## 2022-02-19 DIAGNOSIS — Z87891 Personal history of nicotine dependence: Secondary | ICD-10-CM

## 2022-02-19 DIAGNOSIS — C3490 Malignant neoplasm of unspecified part of unspecified bronchus or lung: Secondary | ICD-10-CM | POA: Diagnosis not present

## 2022-02-19 DIAGNOSIS — R918 Other nonspecific abnormal finding of lung field: Secondary | ICD-10-CM

## 2022-02-19 DIAGNOSIS — R059 Cough, unspecified: Secondary | ICD-10-CM | POA: Insufficient documentation

## 2022-02-19 MED ORDER — BENZONATATE 200 MG PO CAPS: 200.0000 mg | ORAL_CAPSULE | Freq: Three times a day (TID) | ORAL | 1 refills | Status: DC | PRN

## 2022-02-19 NOTE — Patient Instructions (Addendum)
-   Biopsy left lung mass came back positive with malignant cells/ non-small cell carcinoma compatible with adenosquamous carcinoma - You have an upcoming appointment on 928 with Dr. Julien Robertson in 10 3 with radiation oncology - No indication at this time for breathing test called pulmonary function testing as you are not symptomatic respiratory/breathing wise and you are not surgical candidate for treatment due to nodal involvement   Follow-up: - January with Dr. Valeta Robertson (please make sure there is a recall)

## 2022-02-19 NOTE — Assessment & Plan Note (Addendum)
-   Former smoker, quiti smoking in July. Encourage continued abstinence. She has no overt symptoms of obstructive lung disease. No shortness of breath. We will hold off on getting PFTs right now unless she becomes symptomatic.

## 2022-02-19 NOTE — Assessment & Plan Note (Addendum)
-   Patient has dry cough, worse since LUL lung bx. No hemoptysis. Sending in Rx for Tessalon Perles 200 mg every 8 hours as needed for cough, she can also take Delsym every 12 hours as needed for cough.

## 2022-02-19 NOTE — Assessment & Plan Note (Addendum)
-  Hx left upper lobe lung mass, PET positive.  Bronchoscopy with Dr. Valeta Harms on 02/12/2022 positive for malignant cells, non-small cell carcinoma. No PFTs on file, not needed at this time. Patient likely not surgical candidate due to cervical, mediastinal and left hilar node disease on PET imaging. MRI of her brain was negative.  She will be undergoing salivary gland biopsy tomorrow.  She has a follow-up scheduled with Dr. Earlie Server in 2 days on 02/21/2022.  She will also be establishing with radiation oncology on October 3rd. FU with Dr. Valeta Harms in January. Advised if she developed shortness of breath symptoms or respiratory symptom to call our office.

## 2022-02-19 NOTE — Progress Notes (Signed)
@Patient  ID: Yolanda Robertson, female    DOB: [birth year], 76 y.o.   MRN: [id number]  Chief Complaint  Patient presents with   Follow-up    Referring provider: [name], MD  HPI: 76 year old female.  Past medical history significant for left upper lobe lung mass with enlarged cervical left lymph node. Patient of Dr. Valeta Harms, last seen on 02/06/22.   Ordered ultrasound guided cervical node biopsy Referred medical oncology as well as radiation oncology  Ordered MRI brain  02/19/2022- Interim hx  Patient presents today for follow-up/ left lung mass. Patient saw Dr. Julien Nordmann for initial consult on 02/08/22, advised to proceed with MRI of the brain as well as bronchoscopy for tissue sampling. If pathology consistent with non-small cell lung cancer and no evidence of metastatic disease she may benefit from a course of concurrent chemoradiation followed by immunotherapy if no evidence of disease progression. Referred to radiation oncology to eval and discuss radiotherapy option. Follow-up in 2 weeks.   Patient had EBUS bronchoscopy with Dr. Valeta Harms on 02/12/22 for biopsy of left lung mass. Final surgical pathology fine needle aspiration left lung mass was malignant. 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   She has had a dry cough since biopsy. She also had headache after bx which has resolved. No hemoptysis. She is not on any maintenance inhalers. She has never had PFTs. She asymptomatic without significant respiratory symptoms. She has no shortness of breath symptoms. She is likely not surgical candidate as she had cervical, mediastinal and left hilar nodal disease on PET imaging. She is having a biopsy tomorrow of salivary gland/parotid gland. She has upcoming apt 10/28 with Dr. Julien Nordmann and 10/3 with radiation oncology. She quit smoking in July 2023.   Imaging:  02/02/22 PET >> showed hypermetabolic left cervical, mediastinal, and left hilar nodal disease compatible with  metastases or lymphoproliferative disorder. Small anterior left upper lobe pulmonary nodule is hypermetabolic. Symmetric uptake in the tonsillar regions of the oropharynx, nonspecific. Direct visualization may be warranted to exclude mucosal lesion.  FINAL MICROSCOPIC DIAGNOSIS:  A. 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   No Known Allergies  Immunization History  Administered Date(s) Administered   PFIZER(Purple Top)SARS-COV-2 Vaccination 07/29/2019, 08/24/2019    Past Medical History:  Diagnosis Date   Hyperlipidemia    Hypertension    Lung mass    left upper lobe    Tobacco History: Social History   Tobacco Use  Smoking Status Former   Packs/day: 1.00   Types: Cigarettes   Quit date: 12/23/2021   Years since quitting: 0.1   Passive exposure: Past  Smokeless Tobacco Never   Counseling given: Not Answered   Outpatient Medications Prior to Visit  Medication Sig Dispense Refill   ibuprofen (ADVIL) 200 MG tablet Take 200 mg by mouth every 8 (eight) hours as needed (pain.).     indapamide (LOZOL) 1.25 MG tablet Take 1.25 mg by mouth in the morning.     Polyvinyl Alcohol-Povidone PF (REFRESH) 1.4-0.6 % SOLN Place 1-2 drops into both eyes 3 (three) times daily as needed (dry/irritated eyes.).     rosuvastatin (CRESTOR) 10 MG tablet Take 10 mg by mouth in the morning.     No facility-administered medications prior to visit.   Review of Systems  Review of Systems  Constitutional: Negative.   HENT: Negative.    Respiratory:  Positive for cough. Negative for chest tightness, shortness of breath and  wheezing.   Cardiovascular: Negative.    Physical Exam  BP 116/68 (BP Location: Left Arm, Patient Position: Sitting, Cuff Size: Normal)   Pulse 77   Temp 98.4 F (36.9 C) (Oral)   Ht 5\' 4"  (1.626 m)   Wt 172 lb 9.6 oz (78.3 kg)   SpO2 98%   BMI 29.63 kg/m  Physical Exam Constitutional:       Appearance: Normal appearance.  HENT:     Head: Normocephalic and atraumatic.     Mouth/Throat:     Mouth: Mucous membranes are moist.     Pharynx: Oropharynx is clear.  Cardiovascular:     Rate and Rhythm: Normal rate and regular rhythm.  Pulmonary:     Effort: Pulmonary effort is normal.     Breath sounds: Normal breath sounds.     Comments: CTA Musculoskeletal:        General: Normal range of motion.     Cervical back: Normal range of motion and neck supple.  Skin:    General: Skin is warm and dry.  Neurological:     General: No focal deficit present.     Mental Status: She is alert and oriented to person, place, and time. Mental status is at baseline.  Psychiatric:        Mood and Affect: Mood normal.        Behavior: Behavior normal.        Thought Content: Thought content normal.        Judgment: Judgment normal.      Lab Results:  CBC    Component Value Date/Time   WBC 4.4 02/08/2022 0924   RBC 4.22 02/08/2022 0924   HGB 12.1 02/08/2022 0924   HCT 37.1 02/08/2022 0924   PLT 247 02/08/2022 0924   MCV 87.9 02/08/2022 0924   MCH 28.7 02/08/2022 0924   MCHC 32.6 02/08/2022 0924   RDW 13.4 02/08/2022 0924   LYMPHSABS 1.7 02/08/2022 0924   MONOABS 0.4 02/08/2022 0924   EOSABS 0.3 02/08/2022 0924   BASOSABS 0.0 02/08/2022 0924    BMET    Component Value Date/Time   NA 139 02/08/2022 0924   K 5.2 (H) 02/08/2022 0924   CL 106 02/08/2022 0924   CO2 27 02/08/2022 0924   GLUCOSE 98 02/08/2022 0924   BUN 32 (H) 02/08/2022 0924   CREATININE 1.23 (H) 02/08/2022 0924   CALCIUM 9.6 02/08/2022 0924   GFRNONAA 46 (L) 02/08/2022 0924    BNP No results found for: "BNP"  ProBNP No results found for: "PROBNP"  Imaging: MR BRAIN W WO CONTRAST  Result Date: 02/11/2022 CLINICAL DATA:  Non-small cell lung cancer staging. EXAM: MRI HEAD WITHOUT AND WITH CONTRAST TECHNIQUE: Multiplanar, multiecho pulse sequences of the brain and surrounding structures were  obtained without and with intravenous contrast. CONTRAST:  7.89mL GADAVIST GADOBUTROL 1 MMOL/ML IV SOLN COMPARISON:  None Available. FINDINGS: Brain: No acute infarct, hemorrhage, or mass lesion is present. No significant white matter lesions are present. The ventricles are of normal size. No significant extraaxial fluid collection is present. The internal auditory canals are within normal limits. The brainstem and cerebellum are within normal limits. Postcontrast images demonstrate no pathologic enhancement. Vascular: Flow is present in the major intracranial arteries. Skull and upper cervical spine: Mild degenerative changes are present the upper cervical spine. Craniocervical junction is within normal limits. Midline structures are unremarkable. Sinuses/Orbits: Small mastoid effusions are present bilaterally. The paranasal sinuses and mastoid air cells are otherwise clear. Bilateral lens  replacements are noted. Globes and orbits are otherwise unremarkable. IMPRESSION: 1. Normal MRI appearance of the brain. No evidence for metastatic disease to the brain or meninges. 2. Small mastoid effusions bilaterally. No obstructing nasopharyngeal lesion is evident. Electronically Signed   By: San Morelle M.D.   On: 02/11/2022 16:35   NM PET Image Initial (PI) Skull Base To Thigh  Result Date: 02/02/2022 CLINICAL DATA:  Initial treatment strategy for pulmonary nodule. EXAM: NUCLEAR MEDICINE PET SKULL BASE TO THIGH TECHNIQUE: 8.9 mCi F-18 FDG was injected intravenously. Full-ring PET imaging was performed from the skull base to thigh after the radiotracer. CT data was obtained and used for attenuation correction and anatomic localization. Fasting blood glucose: 101 mg/dl COMPARISON:  Lung cancer screening chest CT 12/17/2021 FINDINGS: Mediastinal blood pool activity: SUV max 2.9 Liver activity: SUV max NA NECK: The 17 mm short axis left cervical node versus left parotid node/nodule (image 24/4) is markedly  hypermetabolic with SUV max = 76.5. High level II left-sided lymph node measuring 9 mm short axis on 13/4 demonstrates SUV max = 10.2. Symmetric hypermetabolism is identified in the tonsillar regions bilaterally. Incidental CT findings: None. CHEST: 10 mm anterior left upper lobe pulmonary nodule on image 46/5 is hypermetabolic with SUV max = 7.4. Markedly hypermetabolic 2.6 cm short axis AP window node on 62/4 demonstrates SUV max = 20.4. Left hilar mass/lymphadenopathy measuring 3.6 cm on 69/4 demonstrates SUV max = 22.0. No hypermetabolic right hilar disease. No hypermetabolic lymphadenopathy in the axillary regions. Incidental CT findings: There is mild atherosclerotic calcification of the abdominal aorta without aneurysm. Centrilobular emphsyema noted. ABDOMEN/PELVIS: No abnormal hypermetabolic activity within the liver, pancreas, adrenal glands, or spleen. No hypermetabolic lymph nodes in the abdomen or pelvis. Incidental CT findings: 3.2 cm right adrenal mass has low attenuation (0 Hounsfield units) consistent with benign adrenal adenoma. No hypermetabolism on PET imaging. There is mild atherosclerotic calcification of the abdominal aorta without aneurysm. SKELETON: No focal hypermetabolic activity to suggest skeletal metastasis. Incidental CT findings: No worrisome lytic or sclerotic osseous abnormality. IMPRESSION: 1. Hypermetabolic left cervical, mediastinal, and left hilar nodal disease compatible with metastases or lymphoproliferative disorder. 2. Small anterior left upper lobe pulmonary nodule is hypermetabolic. This could represent primary neoplasm or metastatic disease. 3. Symmetric uptake in the tonsillar regions of the oropharynx, nonspecific. Direct visualization may be warranted to exclude mucosal lesion. 4. 3.2 cm benign right adrenal adenoma. 5.  Aortic Atherosclerois (ICD10-170.0) 6.  Emphysema. ([id number]-F68.9) Electronically Signed   By: Misty Stanley M.D.   On: 02/02/2022 10:16      Assessment & Plan:   Mass of upper lobe of left lung -Hx left upper lobe lung mass, PET positive.  Bronchoscopy with Dr. Valeta Harms on 02/12/2022 positive for malignant cells, non-small cell carcinoma. No PFTs on file, not needed at this time. Patient likely not surgical candidate due to cervical, mediastinal and left hilar node disease on PET imaging. MRI of her brain was negative.  She will be undergoing salivary gland biopsy tomorrow.  She has a follow-up scheduled with Dr. Earlie Server in 2 days on 02/21/2022.  She will also be establishing with radiation oncology on October 3rd. FU with Dr. Valeta Harms in January. Advised if she developed shortness of breath symptoms or respiratory symptom to call our office.  Cough - Patient has dry cough, worse since LUL lung bx. No hemoptysis. Sending in Rx for Tessalon Perles 200 mg every 8 hours as needed for cough, she can also take Delsym every 12  hours as needed for cough.   Former smoker - Former smoker, quiti smoking in July. Encourage continued abstinence. She has no overt symptoms of obstructive lung disease. No shortness of breath. We will hold off on getting PFTs right now unless she becomes symptomatic.    Martyn Ehrich, NP 02/19/2022

## 2022-02-19 NOTE — H&P (Signed)
Chief Complaint: Patient was seen in consultation today for lung mass, L cervical adenopathy at the request of Icard,Bradley L  Referring Physician(s): Icard,Bradley L  Supervising Physician: Simonne Come  Patient Status: [facility] - Out-pt  History of Present Illness: Yolanda Robertson is a 76 y.o. female with PMH significant for lung mass.  Patient underwent lung cancer screening CT on 12/17/2021 that noted irregular nodule within the left upper lobe at 11.5 mm and lymph node measuring 2.1 cm.  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  Patient has been referred from pulmonary to IR for cervical lymph node biopsy to rule out mets versus lymphoma.  Dr. Deanne Coffer reviewed and approved left cervical lymph node biopsy.  Past Medical History:  Diagnosis Date   Hyperlipidemia    Hypertension    Lung mass    left upper lobe    Past Surgical History:  Procedure Laterality Date   FINE NEEDLE ASPIRATION  02/12/2022   Procedure: FINE NEEDLE ASPIRATION (FNA) LINEAR;  Surgeon: Josephine Igo, DO;  Location: [facility];  Service: Pulmonary;;   VAGINAL HYSTERECTOMY     VIDEO BRONCHOSCOPY WITH ENDOBRONCHIAL ULTRASOUND Left 02/12/2022   Procedure: VIDEO BRONCHOSCOPY WITH ENDOBRONCHIAL ULTRASOUND;  Surgeon: Josephine Igo, DO;  Location: [facility];  Service: Pulmonary;  Laterality: Left;    Allergies: Patient has no known allergies.  Medications: Prior to Admission medications   Medication Sig Start Date End Date Taking? Authorizing Provider  ibuprofen (ADVIL) 200 MG tablet Take 200 mg by mouth every 8 (eight) hours as needed (pain.).    [provider]  indapamide (LOZOL) 1.25 MG tablet Take 1.25 mg by mouth in the morning. 09/28/19   [provider]  Polyvinyl Alcohol-Povidone PF (REFRESH) 1.4-0.6 % SOLN Place 1-2 drops into both eyes 3 (three) times daily as needed (dry/irritated eyes.).     [provider]  rosuvastatin (CRESTOR) 10 MG tablet Take 10 mg by mouth in the morning. 07/13/19   [provider]     History reviewed. No pertinent family history.  Social History   Socioeconomic History   Marital status: Married    Spouse name: Delton See   Number of children: 4   Years of education: Not on file   Highest education level: Not on file  Occupational History   Not on file  Tobacco Use   Smoking status: Former    Packs/day: 1.00    Types: Cigarettes    Quit date: 12/23/2021    Years since quitting: 0.1    Passive exposure: Past   Smokeless tobacco: Never  Vaping Use   Vaping Use: Never used  Substance and Sexual Activity   Alcohol use: Never   Drug use: Never   Sexual activity: Yes  Other Topics Concern   Not on file  Social History Narrative   Not on file   Social Determinants of Health   Financial Resource Strain: Not on file  Food Insecurity: Not on file  Transportation Needs: Not on file  Physical Activity: Not on file  Stress: Not on file  Social Connections: Not on file    Review of Systems: A 12 point ROS discussed and pertinent positives are indicated in the HPI above.  All other systems are negative.  Review of Systems  Constitutional:  Negative for chills, fatigue and fever.  Respiratory:  Negative for cough and shortness of breath.   Cardiovascular:  Negative for chest  pain and leg swelling.  Gastrointestinal:  Negative for abdominal pain, nausea and vomiting.  Neurological:  Positive for headaches. Negative for dizziness.  Hematological:  Positive for adenopathy.    Vital Signs: BP 114/82   Pulse (!) 54   Temp 98 F (36.7 C)   Resp 18   Ht 5\' 4"  (1.626 m)   Wt 172 lb 9.9 oz (78.3 kg)   SpO2 96%   BMI 29.63 kg/m      Physical Exam Vitals reviewed.  Constitutional:      General: She is not in acute distress.    Appearance: Normal appearance. She is not ill-appearing.  HENT:     Head: Normocephalic and  atraumatic.     Mouth/Throat:     Mouth: Mucous membranes are dry.     Pharynx: Oropharynx is clear.  Eyes:     Extraocular Movements: Extraocular movements intact.     Pupils: Pupils are equal, round, and reactive to light.  Neck:     Comments: Large notable mass to Left neck Cardiovascular:     Rate and Rhythm: Regular rhythm. Bradycardia present.     Pulses: Normal pulses.     Heart sounds: Normal heart sounds. No murmur heard. Pulmonary:     Effort: Pulmonary effort is normal. No respiratory distress.     Breath sounds: Normal breath sounds.  Abdominal:     General: Bowel sounds are normal. There is no distension.     Palpations: Abdomen is soft.     Tenderness: There is no abdominal tenderness. There is no guarding.  Musculoskeletal:     Right lower leg: No edema.     Left lower leg: No edema.  Lymphadenopathy:     Cervical: Cervical adenopathy present.  Skin:    General: Skin is warm and dry.  Neurological:     Mental Status: She is alert and oriented to person, place, and time.  Psychiatric:        Mood and Affect: Mood normal.        Behavior: Behavior normal.        Thought Content: Thought content normal.        Judgment: Judgment normal.     Imaging: MR BRAIN W WO CONTRAST  Result Date: 02/11/2022 CLINICAL DATA:  Non-small cell lung cancer staging. EXAM: MRI HEAD WITHOUT AND WITH CONTRAST TECHNIQUE: Multiplanar, multiecho pulse sequences of the brain and surrounding structures were obtained without and with intravenous contrast. CONTRAST:  7.35mL GADAVIST GADOBUTROL 1 MMOL/ML IV SOLN COMPARISON:  None Available. FINDINGS: Brain: No acute infarct, hemorrhage, or mass lesion is present. No significant white matter lesions are present. The ventricles are of normal size. No significant extraaxial fluid collection is present. The internal auditory canals are within normal limits. The brainstem and cerebellum are within normal limits. Postcontrast images demonstrate no  pathologic enhancement. Vascular: Flow is present in the major intracranial arteries. Skull and upper cervical spine: Mild degenerative changes are present the upper cervical spine. Craniocervical junction is within normal limits. Midline structures are unremarkable. Sinuses/Orbits: Small mastoid effusions are present bilaterally. The paranasal sinuses and mastoid air cells are otherwise clear. Bilateral lens replacements are noted. Globes and orbits are otherwise unremarkable. IMPRESSION: 1. Normal MRI appearance of the brain. No evidence for metastatic disease to the brain or meninges. 2. Small mastoid effusions bilaterally. No obstructing nasopharyngeal lesion is evident. Electronically Signed   By: Marin Roberts M.D.   On: 02/11/2022 16:35   NM PET Image Initial (PI)  Skull Base To Thigh  Result Date: 02/02/2022 CLINICAL DATA:  Initial treatment strategy for pulmonary nodule. EXAM: NUCLEAR MEDICINE PET SKULL BASE TO THIGH TECHNIQUE: 8.9 mCi F-18 FDG was injected intravenously. Full-ring PET imaging was performed from the skull base to thigh after the radiotracer. CT data was obtained and used for attenuation correction and anatomic localization. Fasting blood glucose: 101 mg/dl COMPARISON:  Lung cancer screening chest CT 12/17/2021 FINDINGS: Mediastinal blood pool activity: SUV max 2.9 Liver activity: SUV max NA NECK: The 17 mm short axis left cervical node versus left parotid node/nodule (image 24/4) is markedly hypermetabolic with SUV max = 11.4. High level II left-sided lymph node measuring 9 mm short axis on 13/4 demonstrates SUV max = 10.2. Symmetric hypermetabolism is identified in the tonsillar regions bilaterally. Incidental CT findings: None. CHEST: 10 mm anterior left upper lobe pulmonary nodule on image 64/4 is hypermetabolic with SUV max = 7.4. Markedly hypermetabolic 2.6 cm short axis AP window node on 62/4 demonstrates SUV max = 20.4. Left hilar mass/lymphadenopathy measuring 3.6 cm on  69/4 demonstrates SUV max = 22.0. No hypermetabolic right hilar disease. No hypermetabolic lymphadenopathy in the axillary regions. Incidental CT findings: There is mild atherosclerotic calcification of the abdominal aorta without aneurysm. Centrilobular emphsyema noted. ABDOMEN/PELVIS: No abnormal hypermetabolic activity within the liver, pancreas, adrenal glands, or spleen. No hypermetabolic lymph nodes in the abdomen or pelvis. Incidental CT findings: 3.2 cm right adrenal mass has low attenuation (0 Hounsfield units) consistent with benign adrenal adenoma. No hypermetabolism on PET imaging. There is mild atherosclerotic calcification of the abdominal aorta without aneurysm. SKELETON: No focal hypermetabolic activity to suggest skeletal metastasis. Incidental CT findings: No worrisome lytic or sclerotic osseous abnormality. IMPRESSION: 1. Hypermetabolic left cervical, mediastinal, and left hilar nodal disease compatible with metastases or lymphoproliferative disorder. 2. Small anterior left upper lobe pulmonary nodule is hypermetabolic. This could represent primary neoplasm or metastatic disease. 3. Symmetric uptake in the tonsillar regions of the oropharynx, nonspecific. Direct visualization may be warranted to exclude mucosal lesion. 4. 3.2 cm benign right adrenal adenoma. 5.  Aortic Atherosclerois (ICD10-170.0) 6.  Emphysema. ([id number]) Electronically Signed   By: Kennith Center M.D.   On: 02/02/2022 10:16    Labs:  CBC: Recent Labs    02/08/22 0924  WBC 4.4  HGB 12.1  HCT 37.1  PLT 247    COAGS: No results for input(s): "INR", "APTT" in the last 8760 hours.  BMP: Recent Labs    02/08/22 0924  NA 139  K 5.2*  CL 106  CO2 27  GLUCOSE 98  BUN 32*  CALCIUM 9.6  CREATININE 1.23*  GFRNONAA 46*    LIVER FUNCTION TESTS: Recent Labs    02/08/22 0924  BILITOT 0.7  AST 19  ALT 18  ALKPHOS 56  PROT 7.7  ALBUMIN 4.2    TUMOR MARKERS: No results for input(s): "AFPTM", "CEA",  "CA199", "CHROMGRNA" in the last 8760 hours.  Assessment and Plan: 76 year old female presents to IR for left cervical lymph node biopsy at the request of Elige Radon Icard, DO  Pt resting on stretcher.  She is A&O, calm and pleasant.  She is in no distress.  Pt is NPO per order.  She took last ASA 81 mg yesterday.  Risks and benefits of biopsy of left cervical lymph node with moderate sedation was discussed with the patient and/or patient's family including, but not limited to bleeding, infection, damage to adjacent structures or low yield requiring additional tests.  All of  the questions were answered and there is agreement to proceed.  Consent signed and in chart.   Thank you for this interesting consult.  I greatly enjoyed meeting Yolanda Robertson and look forward to participating in their care.  A copy of this report was sent to the requesting provider on this date.  Electronically Signed: Shon Hough, NP 02/20/2022, 10:57 AM   I spent a total of 20 minutes in face to face in clinical consultation, greater than 50% of which was counseling/coordinating care for lung mass, cervical adenopathy.

## 2022-02-20 ENCOUNTER — Other Ambulatory Visit: Payer: Self-pay

## 2022-02-20 ENCOUNTER — Ambulatory Visit (HOSPITAL_COMMUNITY)
Admission: RE | Admit: 2022-02-20 | Discharge: 2022-02-20 | Disposition: A | Discharge status: Home / Self Care | Payer: Medicare Other | Source: Ambulatory Visit | Attending: Pulmonary Disease | Admitting: Pulmonary Disease

## 2022-02-20 ENCOUNTER — Encounter (HOSPITAL_COMMUNITY): Payer: Self-pay

## 2022-02-20 DIAGNOSIS — R918 Other nonspecific abnormal finding of lung field: Secondary | ICD-10-CM | POA: Insufficient documentation

## 2022-02-20 DIAGNOSIS — R590 Localized enlarged lymph nodes: Secondary | ICD-10-CM | POA: Insufficient documentation

## 2022-02-20 DIAGNOSIS — D119 Benign neoplasm of major salivary gland, unspecified: Secondary | ICD-10-CM | POA: Diagnosis not present

## 2022-02-20 LAB — CBC WITH DIFFERENTIAL/PLATELET
Abs Immature Granulocytes: 0.02 10*3/uL (ref 0.00–0.07)
Basophils Absolute: 0 10*3/uL (ref 0.0–0.1)
Basophils Relative: 1 %
Eosinophils Absolute: 0.3 10*3/uL (ref 0.0–0.5)
Eosinophils Relative: 6 %
HCT: 36.9 % (ref 36.0–46.0)
Hemoglobin: 11.5 g/dL — ABNORMAL LOW (ref 12.0–15.0)
Immature Granulocytes: 0 %
Lymphocytes Relative: 33 %
Lymphs Abs: 1.8 10*3/uL (ref 0.7–4.0)
MCH: 28 pg (ref 26.0–34.0)
MCHC: 31.2 g/dL (ref 30.0–36.0)
MCV: 90 fL (ref 80.0–100.0)
Monocytes Absolute: 0.4 10*3/uL (ref 0.1–1.0)
Monocytes Relative: 8 %
Neutro Abs: 2.8 10*3/uL (ref 1.7–7.7)
Neutrophils Relative %: 52 %
Platelets: 266 10*3/uL (ref 150–400)
RBC: 4.1 MIL/uL (ref 3.87–5.11)
RDW: 13.6 % (ref 11.5–15.5)
WBC: 5.3 10*3/uL (ref 4.0–10.5)
nRBC: 0 % (ref 0.0–0.2)

## 2022-02-20 LAB — BASIC METABOLIC PANEL
Anion gap: 5 (ref 5–15)
BUN: 20 mg/dL (ref 8–23)
CO2: 26 mmol/L (ref 22–32)
Calcium: 9.4 mg/dL (ref 8.9–10.3)
Chloride: 108 mmol/L (ref 98–111)
Creatinine, Ser: 1.22 mg/dL — ABNORMAL HIGH (ref 0.44–1.00)
GFR, Estimated: 46 mL/min — ABNORMAL LOW (ref >60)
Glucose, Bld: 93 mg/dL (ref 70–99)
Potassium: 4.5 mmol/L (ref 3.5–5.1)
Sodium: 139 mmol/L (ref 135–145)

## 2022-02-20 LAB — PROTIME-INR
INR: 1 (ref 0.8–1.2)
Prothrombin Time: 13.1 seconds (ref 11.4–15.2)

## 2022-02-20 MED ORDER — FENTANYL CITRATE (PF) 100 MCG/2ML IJ SOLN
INTRAMUSCULAR | Status: AC
  Filled 2022-02-20: qty 2

## 2022-02-20 MED ORDER — LIDOCAINE-EPINEPHRINE (PF) 2 %-1:200000 IJ SOLN
INTRAMUSCULAR | Status: AC
  Administered 2022-02-20: 10 mL
  Filled 2022-02-20: qty 20

## 2022-02-20 MED ORDER — LIDOCAINE HCL (PF) 1 % IJ SOLN
INTRAMUSCULAR | Status: AC | PRN
  Administered 2022-02-20: 10 mL

## 2022-02-20 MED ORDER — MIDAZOLAM HCL 2 MG/2ML IJ SOLN
INTRAMUSCULAR | Status: AC | PRN
  Administered 2022-02-20 (×2): 1 mg via INTRAVENOUS

## 2022-02-20 MED ORDER — SODIUM CHLORIDE 0.9 % IV SOLN: INTRAVENOUS | Status: DC

## 2022-02-20 MED ORDER — FENTANYL CITRATE (PF) 100 MCG/2ML IJ SOLN
INTRAMUSCULAR | Status: AC | PRN
  Administered 2022-02-20 (×2): 50 ug via INTRAVENOUS

## 2022-02-20 MED ORDER — MIDAZOLAM HCL 2 MG/2ML IJ SOLN
INTRAMUSCULAR | Status: AC
  Filled 2022-02-20: qty 2

## 2022-02-20 NOTE — Procedures (Signed)
Pre Procedure Dx: Left cervical lymphadenopathy Post Procedural Dx: Same  Technically successful US guided biopsy of indeterminate left cervical lymph node.  EBL: Trace No immediate complications.   Ronny Bacon, MD Pager #: [phone]

## 2022-02-21 ENCOUNTER — Inpatient Hospital Stay (HOSPITAL_BASED_OUTPATIENT_CLINIC_OR_DEPARTMENT_OTHER): Payer: Medicare Other | Admitting: Internal Medicine

## 2022-02-21 ENCOUNTER — Inpatient Hospital Stay: Payer: Medicare Other

## 2022-02-21 ENCOUNTER — Encounter: Payer: Self-pay | Admitting: Internal Medicine

## 2022-02-21 VITALS — BP 114/82 | HR 72 | Temp 98.5°F | Resp 15 | Wt 170.5 lb

## 2022-02-21 DIAGNOSIS — I700 Atherosclerosis of aorta: Secondary | ICD-10-CM | POA: Diagnosis not present

## 2022-02-21 DIAGNOSIS — I10 Essential (primary) hypertension: Secondary | ICD-10-CM | POA: Diagnosis not present

## 2022-02-21 DIAGNOSIS — Z79899 Other long term (current) drug therapy: Secondary | ICD-10-CM | POA: Diagnosis not present

## 2022-02-21 DIAGNOSIS — Z8249 Family history of ischemic heart disease and other diseases of the circulatory system: Secondary | ICD-10-CM | POA: Diagnosis not present

## 2022-02-21 DIAGNOSIS — E785 Hyperlipidemia, unspecified: Secondary | ICD-10-CM | POA: Diagnosis not present

## 2022-02-21 DIAGNOSIS — M47812 Spondylosis without myelopathy or radiculopathy, cervical region: Secondary | ICD-10-CM | POA: Diagnosis not present

## 2022-02-21 DIAGNOSIS — C3492 Malignant neoplasm of unspecified part of left bronchus or lung: Secondary | ICD-10-CM

## 2022-02-21 DIAGNOSIS — R0609 Other forms of dyspnea: Secondary | ICD-10-CM | POA: Diagnosis not present

## 2022-02-21 DIAGNOSIS — J439 Emphysema, unspecified: Secondary | ICD-10-CM | POA: Diagnosis not present

## 2022-02-21 DIAGNOSIS — Z5111 Encounter for antineoplastic chemotherapy: Secondary | ICD-10-CM

## 2022-02-21 DIAGNOSIS — D3501 Benign neoplasm of right adrenal gland: Secondary | ICD-10-CM | POA: Diagnosis not present

## 2022-02-21 DIAGNOSIS — R5383 Other fatigue: Secondary | ICD-10-CM | POA: Diagnosis not present

## 2022-02-21 DIAGNOSIS — Z87891 Personal history of nicotine dependence: Secondary | ICD-10-CM | POA: Diagnosis not present

## 2022-02-21 DIAGNOSIS — R590 Localized enlarged lymph nodes: Secondary | ICD-10-CM | POA: Diagnosis not present

## 2022-02-21 DIAGNOSIS — C3412 Malignant neoplasm of upper lobe, left bronchus or lung: Secondary | ICD-10-CM | POA: Diagnosis not present

## 2022-02-21 DIAGNOSIS — R918 Other nonspecific abnormal finding of lung field: Secondary | ICD-10-CM

## 2022-02-21 DIAGNOSIS — Z836 Family history of other diseases of the respiratory system: Secondary | ICD-10-CM | POA: Diagnosis not present

## 2022-02-21 LAB — CMP (CANCER CENTER ONLY)
ALT: 14 U/L (ref 0–44)
AST: 11 U/L — ABNORMAL LOW (ref 15–41)
Albumin: 4.1 g/dL (ref 3.5–5.0)
Alkaline Phosphatase: 66 U/L (ref 38–126)
Anion gap: 9 (ref 5–15)
BUN: 23 mg/dL (ref 8–23)
CO2: 28 mmol/L (ref 22–32)
Calcium: 9.4 mg/dL (ref 8.9–10.3)
Chloride: 102 mmol/L (ref 98–111)
Creatinine: 1.14 mg/dL — ABNORMAL HIGH (ref 0.44–1.00)
GFR, Estimated: 50 mL/min — ABNORMAL LOW (ref >60)
Glucose, Bld: 100 mg/dL — ABNORMAL HIGH (ref 70–99)
Potassium: 4.5 mmol/L (ref 3.5–5.1)
Sodium: 139 mmol/L (ref 135–145)
Total Bilirubin: 0.4 mg/dL (ref 0.3–1.2)
Total Protein: 7.8 g/dL (ref 6.5–8.1)

## 2022-02-21 LAB — CBC WITH DIFFERENTIAL (CANCER CENTER ONLY)
Abs Immature Granulocytes: 0.01 10*3/uL (ref 0.00–0.07)
Basophils Absolute: 0 10*3/uL (ref 0.0–0.1)
Basophils Relative: 1 %
Eosinophils Absolute: 0.3 10*3/uL (ref 0.0–0.5)
Eosinophils Relative: 6 %
HCT: 35.8 % — ABNORMAL LOW (ref 36.0–46.0)
Hemoglobin: 11.7 g/dL — ABNORMAL LOW (ref 12.0–15.0)
Immature Granulocytes: 0 %
Lymphocytes Relative: 31 %
Lymphs Abs: 1.7 10*3/uL (ref 0.7–4.0)
MCH: 28.3 pg (ref 26.0–34.0)
MCHC: 32.7 g/dL (ref 30.0–36.0)
MCV: 86.7 fL (ref 80.0–100.0)
Monocytes Absolute: 0.4 10*3/uL (ref 0.1–1.0)
Monocytes Relative: 8 %
Neutro Abs: 2.9 10*3/uL (ref 1.7–7.7)
Neutrophils Relative %: 54 %
Platelet Count: 274 10*3/uL (ref 150–400)
RBC: 4.13 MIL/uL (ref 3.87–5.11)
RDW: 13.4 % (ref 11.5–15.5)
WBC Count: 5.3 10*3/uL (ref 4.0–10.5)
nRBC: 0 % (ref 0.0–0.2)

## 2022-02-21 LAB — SURGICAL PATHOLOGY

## 2022-02-21 MED ORDER — PROCHLORPERAZINE MALEATE 10 MG PO TABS: 10.0000 mg | ORAL_TABLET | Freq: Four times a day (QID) | ORAL | 0 refills | Status: DC | PRN

## 2022-02-21 NOTE — Progress Notes (Signed)
START ON PATHWAY REGIMEN - Non-Small Cell Lung     A cycle is every 7 days, concurrent with RT:     Paclitaxel      Carboplatin   **Always confirm dose/schedule in your pharmacy ordering system**  Patient Characteristics: Preoperative or Nonsurgical Candidate (Clinical Staging), Stage III - Nonsurgical Candidate (Nonsquamous and Squamous), PS = 0, 1 Therapeutic Status: Preoperative or Nonsurgical Candidate (Clinical Staging) AJCC T Category: cT1a AJCC N Category: cN3 AJCC M Category: cM0 AJCC 8 Stage Grouping: IIIB ECOG Performance Status: 1 Intent of Therapy: Curative Intent, Discussed with Patient

## 2022-02-21 NOTE — Progress Notes (Signed)
[place] [phone]   [phone]  OFFICE PROGRESS NOTE  [name], [address] [place] [address]  DIAGNOSIS: Stage IIIb (T1a, N3, M0) non-small cell lung cancer, adenosquamous carcinoma presented with left upper lobe pulmonary nodule in addition to left hilar, mediastinal and left supraclavicular and cervical lymphadenopathy diagnosed in September 2023.  Detected Alteration(s) / Biomarker(s) Associated FDA-approved therapies Clinical Trial Availability % cfDNA or Amplification  TP53 [id number] None Yes 1.6%  NFE2L2 D29V None Yes 1.2%  TP53 [id number] None Yes 0.4%   PRIOR THERAPY: None  CURRENT THERAPY: A course of concurrent chemoradiation with weekly carboplatin for AUC of 2 and paclitaxel 45 Mg/M2.  First dose March 04, 2022  INTERVAL HISTORY: Yolanda Robertson 76 y.o. female returns to the clinic today for follow-up visit accompanied by her husband Meda Coffee.  The patient is feeling fine today with no concerning complaints.  She denied having any current chest pain but has shortness of breath with exertion with mild cough and no hemoptysis.  She has no nausea, vomiting, diarrhea or constipation.  She has no headache or visual changes.  She denied having any recent weight loss or night sweats.  She had several studies performed recently including a PET scan as well as MRI of the brain, bronchoscopy by Dr. Valeta Harms as well as ultrasound-guided core biopsy of left cervical lymph node performed yesterday.  The patient is here today for evaluation and discussion of her treatment options based on the recent studies.  MEDICAL HISTORY: Past Medical History:  Diagnosis Date   Hyperlipidemia    Hypertension    Lung mass    left upper lobe    ALLERGIES:  has No Known Allergies.  MEDICATIONS:  Current Outpatient Medications  Medication Sig Dispense Refill   benzonatate (TESSALON) 200 MG capsule Take 1 capsule (200 mg total) by mouth 3  (three) times daily as needed for cough. 30 capsule 1   ibuprofen (ADVIL) 200 MG tablet Take 200 mg by mouth every 8 (eight) hours as needed (pain.).     indapamide (LOZOL) 1.25 MG tablet Take 1.25 mg by mouth in the morning.     Polyvinyl Alcohol-Povidone PF (REFRESH) 1.4-0.6 % SOLN Place 1-2 drops into both eyes 3 (three) times daily as needed (dry/irritated eyes.).     rosuvastatin (CRESTOR) 10 MG tablet Take 10 mg by mouth in the morning.     No current facility-administered medications for this visit.    SURGICAL HISTORY:  Past Surgical History:  Procedure Laterality Date   FINE NEEDLE ASPIRATION  02/12/2022   Procedure: FINE NEEDLE ASPIRATION (FNA) LINEAR;  Surgeon: Garner Nash, DO;  Location: [facility];  Service: Pulmonary;;   VAGINAL HYSTERECTOMY     VIDEO BRONCHOSCOPY WITH ENDOBRONCHIAL ULTRASOUND Left 02/12/2022   Procedure: VIDEO BRONCHOSCOPY WITH ENDOBRONCHIAL ULTRASOUND;  Surgeon: Garner Nash, DO;  Location: [facility];  Service: Pulmonary;  Laterality: Left;    REVIEW OF SYSTEMS:  Constitutional: positive for fatigue Eyes: negative Ears, nose, mouth, throat, and face: negative Respiratory: positive for cough and dyspnea on exertion Cardiovascular: negative Gastrointestinal: negative Genitourinary:negative Integument/breast: negative Hematologic/lymphatic: negative Musculoskeletal:negative Neurological: negative Behavioral/Psych: negative Endocrine: negative Allergic/Immunologic: negative   PHYSICAL EXAMINATION: General appearance: alert, cooperative, and no distress Head: Normocephalic, without obvious abnormality, atraumatic Neck: no adenopathy, no JVD, supple, symmetrical, trachea midline, and thyroid not enlarged, symmetric, no tenderness/mass/nodules Lymph nodes: Cervical, supraclavicular, and axillary nodes normal. Resp: clear to auscultation  bilaterally Back: symmetric, no curvature. ROM normal. No CVA tenderness. Cardio: regular rate and  rhythm, S1, S2 normal, no murmur, click, rub or gallop GI: soft, non-tender; bowel sounds normal; no masses,  no organomegaly Extremities: extremities normal, atraumatic, no cyanosis or edema Neurologic: Alert and oriented X 3, normal strength and tone. Normal symmetric reflexes. Normal coordination and gait  ECOG PERFORMANCE STATUS: 1 - Symptomatic but completely ambulatory  Blood pressure 114/82, pulse 72, temperature 98.5 F (36.9 C), temperature source Oral, resp. rate 15, weight 170 lb 8 oz (77.3 kg), SpO2 95 %.  LABORATORY DATA: Lab Results  Component Value Date   WBC 5.3 02/20/2022   HGB 11.5 (L) 02/20/2022   HCT 36.9 02/20/2022   MCV 90.0 02/20/2022   PLT 266 02/20/2022      Chemistry      Component Value Date/Time   NA 139 02/20/2022 1101   K 4.5 02/20/2022 1101   CL 108 02/20/2022 1101   CO2 26 02/20/2022 1101   BUN 20 02/20/2022 1101   CREATININE 1.22 (H) 02/20/2022 1101   CREATININE 1.23 (H) 02/08/2022 0924      Component Value Date/Time   CALCIUM 9.4 02/20/2022 1101   ALKPHOS 56 02/08/2022 0924   AST 19 02/08/2022 0924   ALT 18 02/08/2022 0924   BILITOT 0.7 02/08/2022 0924       RADIOGRAPHIC STUDIES: [place] CORE BIOPSY (SALIVARY GLAND/PAROTID GLAND)  Result Date: 02/20/2022 INDICATION: Concern for lymphoproliferative disorder. Please perform ultrasound-guided biopsy of left cervical lymph node for tissue diagnostic purposes. EXAM: ULTRASOUND-GUIDED LEFT CERVICAL LYMPH NODE BIOPSY COMPARISON:  PET-CT-02/01/2022 MEDICATIONS: None ANESTHESIA/SEDATION: Moderate (conscious) sedation was employed during this procedure. A total of Versed 2 mg and Fentanyl 100 mcg was administered intravenously. Moderate Sedation Time: 10 minutes. The patient's level of consciousness and vital signs were monitored continuously by radiology nursing throughout the procedure under my direct supervision. COMPLICATIONS: None immediate. TECHNIQUE: Informed written consent was obtained from the  patient after a discussion of the risks, benefits and alternatives to treatment. Questions regarding the procedure were encouraged and answered. Initial ultrasound scanning demonstrated an approximately 2.6 x 1.9 cm malignant appearing high left cervical lymph node (image 4), correlating with the hypermetabolic lymph node seen on preceding PET-CT image 24, series 603. An ultrasound image was saved for documentation purposes. The procedure was planned. A timeout was performed prior to the initiation of the procedure. The operative was prepped and draped in the usual sterile fashion, and a sterile drape was applied covering the operative field. A timeout was performed prior to the initiation of the procedure. Local anesthesia was provided with 1% lidocaine with epinephrine. Under direct ultrasound guidance, an 18 gauge core needle device was utilized to obtain to obtain 6 core needle biopsies of the hypermetabolic left cervical lymph node. The samples were placed in saline and submitted to pathology. The needle was removed and hemostasis was achieved with manual compression. Post procedure scan was negative for significant hematoma. A dressing was applied. The patient tolerated the procedure well without immediate postprocedural complication. IMPRESSION: Technically successful ultrasound guided biopsy of dominant hypermetabolic left cervical lymph node. Electronically Signed   By: Sandi Mariscal M.D.   On: 02/20/2022 12:34   MR BRAIN W WO CONTRAST  Result Date: 02/11/2022 CLINICAL DATA:  Non-small cell lung cancer staging. EXAM: MRI HEAD WITHOUT AND WITH CONTRAST TECHNIQUE: Multiplanar, multiecho pulse sequences of the brain and surrounding structures were obtained without and with intravenous contrast. CONTRAST:  7.78m GADAVIST GADOBUTROL  1 MMOL/ML IV SOLN COMPARISON:  None Available. FINDINGS: Brain: No acute infarct, hemorrhage, or mass lesion is present. No significant white matter lesions are present. The  ventricles are of normal size. No significant extraaxial fluid collection is present. The internal auditory canals are within normal limits. The brainstem and cerebellum are within normal limits. Postcontrast images demonstrate no pathologic enhancement. Vascular: Flow is present in the major intracranial arteries. Skull and upper cervical spine: Mild degenerative changes are present the upper cervical spine. Craniocervical junction is within normal limits. Midline structures are unremarkable. Sinuses/Orbits: Small mastoid effusions are present bilaterally. The paranasal sinuses and mastoid air cells are otherwise clear. Bilateral lens replacements are noted. Globes and orbits are otherwise unremarkable. IMPRESSION: 1. Normal MRI appearance of the brain. No evidence for metastatic disease to the brain or meninges. 2. Small mastoid effusions bilaterally. No obstructing nasopharyngeal lesion is evident. Electronically Signed   By: San Morelle M.D.   On: 02/11/2022 16:35   NM PET Image Initial (PI) Skull Base To Thigh  Result Date: 02/02/2022 CLINICAL DATA:  Initial treatment strategy for pulmonary nodule. EXAM: NUCLEAR MEDICINE PET SKULL BASE TO THIGH TECHNIQUE: 8.9 mCi F-18 FDG was injected intravenously. Full-ring PET imaging was performed from the skull base to thigh after the radiotracer. CT data was obtained and used for attenuation correction and anatomic localization. Fasting blood glucose: 101 mg/dl COMPARISON:  Lung cancer screening chest CT 12/17/2021 FINDINGS: Mediastinal blood pool activity: SUV max 2.9 Liver activity: SUV max NA NECK: The 17 mm short axis left cervical node versus left parotid node/nodule (image 24/4) is markedly hypermetabolic with SUV max = 54.6. High level II left-sided lymph node measuring 9 mm short axis on 13/4 demonstrates SUV max = 10.2. Symmetric hypermetabolism is identified in the tonsillar regions bilaterally. Incidental CT findings: None. CHEST: 10 mm anterior  left upper lobe pulmonary nodule on image 56/8 is hypermetabolic with SUV max = 7.4. Markedly hypermetabolic 2.6 cm short axis AP window node on 62/4 demonstrates SUV max = 20.4. Left hilar mass/lymphadenopathy measuring 3.6 cm on 69/4 demonstrates SUV max = 22.0. No hypermetabolic right hilar disease. No hypermetabolic lymphadenopathy in the axillary regions. Incidental CT findings: There is mild atherosclerotic calcification of the abdominal aorta without aneurysm. Centrilobular emphsyema noted. ABDOMEN/PELVIS: No abnormal hypermetabolic activity within the liver, pancreas, adrenal glands, or spleen. No hypermetabolic lymph nodes in the abdomen or pelvis. Incidental CT findings: 3.2 cm right adrenal mass has low attenuation (0 Hounsfield units) consistent with benign adrenal adenoma. No hypermetabolism on PET imaging. There is mild atherosclerotic calcification of the abdominal aorta without aneurysm. SKELETON: No focal hypermetabolic activity to suggest skeletal metastasis. Incidental CT findings: No worrisome lytic or sclerotic osseous abnormality. IMPRESSION: 1. Hypermetabolic left cervical, mediastinal, and left hilar nodal disease compatible with metastases or lymphoproliferative disorder. 2. Small anterior left upper lobe pulmonary nodule is hypermetabolic. This could represent primary neoplasm or metastatic disease. 3. Symmetric uptake in the tonsillar regions of the oropharynx, nonspecific. Direct visualization may be warranted to exclude mucosal lesion. 4. 3.2 cm benign right adrenal adenoma. 5.  Aortic Atherosclerois (ICD10-170.0) 6.  Emphysema. ([id number]) Electronically Signed   By: Misty Stanley M.D.   On: 02/02/2022 10:16    ASSESSMENT AND PLAN: This is a very pleasant 76 years old African-American female with Stage IIIb (T1a, N3, M0) non-small cell lung cancer, adenosquamous carcinoma presented with left upper lobe pulmonary nodule in addition to left hilar, mediastinal and left supraclavicular  and cervical lymphadenopathy diagnosed  in September 2023. The patient has no actionable mutations and PD-L1 expression is still pending. She had a PET scan as well as MRI of the brain performed recently.  I personally and independently reviewed the scan images and discussed the result with the patient and her husband. The patient also had bronchoscopy by Dr. Valeta Harms and the final pathology was consistent with adenosquamous carcinoma from the left upper lobe lung nodule. She had ultrasound-guided core biopsy of a left cervical lymph node yesterday but the biopsy results are still pending. I had a lengthy discussion with the patient today about her current condition and treatment options. I recommended for the patient a course of concurrent chemoradiation with weekly carboplatin for AUC of 2 and paclitaxel 45 Mg/M2 for 6-7 weeks.  She was referred to Dr. Isidore Moos and has an appointment with her next week.  She will also have consolidation treatment with immunotherapy if there is no evidence for disease progression after the induction phase. I expect the patient to start the first dose of her concurrent chemoradiation on March 04, 2022. I discussed with the patient the adverse effect of this treatment including but not limited to alopecia, myelosuppression, nausea and vomiting, peripheral neuropathy, liver or renal dysfunction. I will arrange for the patient to have a chemotherapy education class before the first dose of her treatment. She will come back for follow-up visit in around 2 weeks for evaluation and management of any adverse effect of her treatment. I will call her pharmacy with prescription for Compazine 10 mg p.o. every 6 hours as needed for nausea. The patient was advised to call immediately if she has any other concerning symptoms in the interval. The patient voices understanding of current disease status and treatment options and is in agreement with the current care plan.  All questions  were answered. The patient knows to call the clinic with any problems, questions or concerns. We can certainly see the patient much sooner if necessary.  The total time spent in the appointment was 30 minutes.  Disclaimer: This note was dictated with voice recognition software. Similar sounding words can inadvertently be transcribed and may not be corrected upon review.

## 2022-02-21 NOTE — Progress Notes (Signed)
Reviewed with Dr. Julien Nordmann in office today. I appreciate it.   Thanks,  BLI  Garner Nash, DO [facility] 02/21/2022 9:57 AM

## 2022-02-22 ENCOUNTER — Other Ambulatory Visit: Payer: Self-pay

## 2022-02-23 ENCOUNTER — Other Ambulatory Visit: Payer: Self-pay

## 2022-02-23 DIAGNOSIS — E7849 Other hyperlipidemia: Secondary | ICD-10-CM | POA: Diagnosis not present

## 2022-02-23 DIAGNOSIS — I10 Essential (primary) hypertension: Secondary | ICD-10-CM | POA: Diagnosis not present

## 2022-02-25 ENCOUNTER — Other Ambulatory Visit: Payer: Self-pay

## 2022-02-25 ENCOUNTER — Encounter (HOSPITAL_COMMUNITY): Payer: Self-pay

## 2022-02-25 NOTE — Progress Notes (Signed)
Radiation Oncology         [phone] ________________________________  Initial Outpatient Consultation  Name: Yolanda Robertson MRN: [id number]  Date: 02/26/2022  DOB: 1946-01-20  RF:XJOIT, Myra Rude, MD  Garner Nash, DO   REFERRING PHYSICIAN: Garner Nash, DO  DIAGNOSIS:    ICD-10-CM   1. 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 female with a 42 pack year smoking history. She presented for a lung cancer screening CT on 12/17/21 which revealed a LUL pulmonary nodule measuring 11.55 mm and concurrent mediastinal and left infrahilar adenopathy concerning for primary bronchogenic carcinoma with nodal metastasis (lung-rads 4X highly suspicious).   Accordingly, the patient was referred to Dr. Valeta Harms on 01/23/22 for further evaluation and management. During this visit, the patient also complained of an enlarged lymph node in her left neck.   For further evaluation of CT findings, Dr. Valeta Harms ordered a PET scan on 02/01/22 which revealed: hypermetabolic left cervical, mediastinal, and left hilar nodal disease compatible with metastases or lymphoproliferative disorder; a small hypermetabolic anterior left upper lobe pulmonary nodule; and symmetric uptake in the tonsillar regions of the oropharynx (nonspecific). PET also  showed a 3.2 cm benign right adrenal adenoma.  I have personally reviewed her imaging.  The patient was then referred to Dr. Julien Nordmann on 02/08/22 to discuss systemic treatment options. During this visit, Dr. Julien Nordmann recommended a course of concurrent chemoradiation with weekly carboplatin for AUC of 2 and paclitaxel 45 Mg/M2 if final pathology shows no evidence of metastatic disease. He also discussed the possibility of immunotherapy following chemotherapy if she is without evidence of disease progression.  MRI of the brain with and without contrast on 02/09/22 revealed no evidence of intracranial metastatic disease.   The patient opted to proceed with bronchoscopy and biopsies on 02/12/22 under the care of Dr. Valeta Harms. FNA of the left lung mass revealed malignant cells consistent with non-small cell carcinoma compatible with adenosquamous carcinoma. Molecular studies came back showing a PD-L1 score of 80%.   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   The patient will proceed with weekly carboplatin for AUC of 2 and paclitaxel 45 Mg/M2 for 6-7 weeks. Her tentative start date for systemic treatment is 03/04/22.    No hemoptysis. Has occasional dry cough. No wt loss.  PREVIOUS RADIATION THERAPY: No  PAST MEDICAL HISTORY:  has a past medical history of Hyperlipidemia, Hypertension, and Lung mass.    PAST SURGICAL HISTORY: Past Surgical History:  Procedure Laterality Date   FINE NEEDLE ASPIRATION  02/12/2022   Procedure: FINE NEEDLE ASPIRATION (FNA) LINEAR;  Surgeon: Garner Nash, DO;  Location: [facility] [facility];  Service: Pulmonary;;   VAGINAL HYSTERECTOMY     VIDEO BRONCHOSCOPY WITH ENDOBRONCHIAL ULTRASOUND Left 02/12/2022  Procedure: VIDEO BRONCHOSCOPY WITH ENDOBRONCHIAL ULTRASOUND;  Surgeon: Garner Nash, DO;  Location: [place];  Service: Pulmonary;  Laterality: Left;    FAMILY HISTORY: family history is not on  file.  SOCIAL HISTORY:  reports that she quit smoking about 2 months ago. Her smoking use included cigarettes. She smoked an average of 1 pack per day. She has been exposed to tobacco smoke. She has never used smokeless tobacco. She reports that she does not drink alcohol and does not use drugs.  ALLERGIES: Patient has no known allergies.  MEDICATIONS:  Current Outpatient Medications  Medication Sig Dispense Refill   benzonatate (TESSALON) 200 MG capsule Take 1 capsule (200 mg total) by mouth 3 (three) times daily as needed for cough. 30 capsule 1   ibuprofen (ADVIL) 200 MG tablet Take 200 mg by mouth every 8 (eight) hours as needed (pain.).     indapamide (LOZOL) 1.25 MG tablet Take 1.25 mg by mouth in the morning.     Polyvinyl Alcohol-Povidone PF (REFRESH) 1.4-0.6 % SOLN Place 1-2 drops into both eyes 3 (three) times daily as needed (dry/irritated eyes.).     prochlorperazine (COMPAZINE) 10 MG tablet Take 1 tablet (10 mg total) by mouth every 6 (six) hours as needed for nausea or vomiting. 30 tablet 0   rosuvastatin (CRESTOR) 10 MG tablet Take 10 mg by mouth in the morning.     No current facility-administered medications for this encounter.    REVIEW OF SYSTEMS:  Notable for that above.   PHYSICAL EXAM:  height is _0  (1.626 m) and weight is 169 lb 6.4 oz (76.8 kg). Her temperature is 98.5 F (36.9 C). Her blood pressure is 101/75 and her pulse is 59 (abnormal). Her respiration is 18 and oxygen saturation is 100%.   General: Alert and oriented, in no acute distress  HEENT: Head is normocephalic. Extraocular movements are intact. Oropharynx is clear. Neck: + visible, palpable left level II mass  Heart: Regular in rate and rhythm with no murmurs, rubs, or gallops. Chest: Clear to auscultation bilaterally, with no rhonchi, wheezes, or rales. Abdomen: Soft, nontender, nondistended, with no rigidity or guarding. Extremities: No cyanosis or edema. Lymphatics: see Neck Exam Skin: No  concerning lesions. Musculoskeletal: symmetric strength and muscle tone throughout. Neurologic: Cranial nerves II through XII are grossly intact. No obvious focalities. Speech is fluent. Coordination is intact. Psychiatric: Judgment and insight are intact. Affect is appropriate.   ECOG = 1  0 - Asymptomatic (Fully active, able to carry on all predisease activities without restriction)  1 - Symptomatic but completely ambulatory (Restricted in physically strenuous activity but ambulatory and able to carry out work of a light or sedentary nature. For example, light housework, office work)  2 - Symptomatic, <50% in bed during the day (Ambulatory and capable of all self care but unable to carry out any work activities. Up and about more than 50% of waking hours)  3 - Symptomatic, >50% in bed, but not bedbound (Capable of only limited self-care, confined to bed or chair 50% or more of waking hours)  4 - Bedbound (Completely disabled. Cannot carry on any self-care. Totally confined to bed or chair)  5 - Death   Eustace Pen MM, Creech RH, Tormey DC, et al. [id number]). "Toxicity and response criteria of the [facility]". [place] Oncol. 5 (6): 649-55   LABORATORY DATA:  Lab Results  Component Value Date   WBC 5.3 02/21/2022   HGB 11.7 (L) 02/21/2022  HCT 35.8 (L) 02/21/2022   MCV 86.7 02/21/2022   PLT 274 02/21/2022   CMP     Component Value Date/Time   NA 139 02/21/2022 0849   K 4.5 02/21/2022 0849   CL 102 02/21/2022 0849   CO2 28 02/21/2022 0849   GLUCOSE 100 (H) 02/21/2022 0849   BUN 23 02/21/2022 0849   CREATININE 1.14 (H) 02/21/2022 0849   CALCIUM 9.4 02/21/2022 0849   PROT 7.8 02/21/2022 0849   ALBUMIN 4.1 02/21/2022 0849   AST 11 (L) 02/21/2022 0849   ALT 14 02/21/2022 0849   ALKPHOS 66 02/21/2022 0849   BILITOT 0.4 02/21/2022 0849   GFRNONAA 50 (L) 02/21/2022 0849         RADIOGRAPHY: [place] CORE BIOPSY (SALIVARY GLAND/PAROTID GLAND)  Result Date:  02/20/2022 INDICATION: Concern for lymphoproliferative disorder. Please perform ultrasound-guided biopsy of left cervical lymph node for tissue diagnostic purposes. EXAM: ULTRASOUND-GUIDED LEFT CERVICAL LYMPH NODE BIOPSY COMPARISON:  PET-CT-02/01/2022 MEDICATIONS: None ANESTHESIA/SEDATION: Moderate (conscious) sedation was employed during this procedure. A total of Versed 2 mg and Fentanyl 100 mcg was administered intravenously. Moderate Sedation Time: 10 minutes. The patient's level of consciousness and vital signs were monitored continuously by radiology nursing throughout the procedure under my direct supervision. COMPLICATIONS: None immediate. TECHNIQUE: Informed written consent was obtained from the patient after a discussion of the risks, benefits and alternatives to treatment. Questions regarding the procedure were encouraged and answered. Initial ultrasound scanning demonstrated an approximately 2.6 x 1.9 cm malignant appearing high left cervical lymph node (image 4), correlating with the hypermetabolic lymph node seen on preceding PET-CT image 24, series 603. An ultrasound image was saved for documentation purposes. The procedure was planned. A timeout was performed prior to the initiation of the procedure. The operative was prepped and draped in the usual sterile fashion, and a sterile drape was applied covering the operative field. A timeout was performed prior to the initiation of the procedure. Local anesthesia was provided with 1% lidocaine with epinephrine. Under direct ultrasound guidance, an 18 gauge core needle device was utilized to obtain to obtain 6 core needle biopsies of the hypermetabolic left cervical lymph node. The samples were placed in saline and submitted to pathology. The needle was removed and hemostasis was achieved with manual compression. Post procedure scan was negative for significant hematoma. A dressing was applied. The patient tolerated the procedure well without immediate  postprocedural complication. IMPRESSION: Technically successful ultrasound guided biopsy of dominant hypermetabolic left cervical lymph node. Electronically Signed   By: Sandi Mariscal M.D.   On: 02/20/2022 12:34   MR BRAIN W WO CONTRAST  Result Date: 02/11/2022 CLINICAL DATA:  Non-small cell lung cancer staging. EXAM: MRI HEAD WITHOUT AND WITH CONTRAST TECHNIQUE: Multiplanar, multiecho pulse sequences of the brain and surrounding structures were obtained without and with intravenous contrast. CONTRAST:  7.36m GADAVIST GADOBUTROL 1 MMOL/ML IV SOLN COMPARISON:  None Available. FINDINGS: Brain: No acute infarct, hemorrhage, or mass lesion is present. No significant white matter lesions are present. The ventricles are of normal size. No significant extraaxial fluid collection is present. The internal auditory canals are within normal limits. The brainstem and cerebellum are within normal limits. Postcontrast images demonstrate no pathologic enhancement. Vascular: Flow is present in the major intracranial arteries. Skull and upper cervical spine: Mild degenerative changes are present the upper cervical spine. Craniocervical junction is within normal limits. Midline structures are unremarkable. Sinuses/Orbits: Small mastoid effusions are present bilaterally. The paranasal sinuses and mastoid air cells are  otherwise clear. Bilateral lens replacements are noted. Globes and orbits are otherwise unremarkable. IMPRESSION: 1. Normal MRI appearance of the brain. No evidence for metastatic disease to the brain or meninges. 2. Small mastoid effusions bilaterally. No obstructing nasopharyngeal lesion is evident. Electronically Signed   By: San Morelle M.D.   On: 02/11/2022 16:35   NM PET Image Initial (PI) Skull Base To Thigh  Result Date: 02/02/2022 CLINICAL DATA:  Initial treatment strategy for pulmonary nodule. EXAM: NUCLEAR MEDICINE PET SKULL BASE TO THIGH TECHNIQUE: 8.9 mCi F-18 FDG was injected intravenously.  Full-ring PET imaging was performed from the skull base to thigh after the radiotracer. CT data was obtained and used for attenuation correction and anatomic localization. Fasting blood glucose: 101 mg/dl COMPARISON:  Lung cancer screening chest CT 12/17/2021 FINDINGS: Mediastinal blood pool activity: SUV max 2.9 Liver activity: SUV max NA NECK: The 17 mm short axis left cervical node versus left parotid node/nodule (image 24/4) is markedly hypermetabolic with SUV max = 25.9. High level II left-sided lymph node measuring 9 mm short axis on 13/4 demonstrates SUV max = 10.2. Symmetric hypermetabolism is identified in the tonsillar regions bilaterally. Incidental CT findings: None. CHEST: 10 mm anterior left upper lobe pulmonary nodule on image 56/3 is hypermetabolic with SUV max = 7.4. Markedly hypermetabolic 2.6 cm short axis AP window node on 62/4 demonstrates SUV max = 20.4. Left hilar mass/lymphadenopathy measuring 3.6 cm on 69/4 demonstrates SUV max = 22.0. No hypermetabolic right hilar disease. No hypermetabolic lymphadenopathy in the axillary regions. Incidental CT findings: There is mild atherosclerotic calcification of the abdominal aorta without aneurysm. Centrilobular emphsyema noted. ABDOMEN/PELVIS: No abnormal hypermetabolic activity within the liver, pancreas, adrenal glands, or spleen. No hypermetabolic lymph nodes in the abdomen or pelvis. Incidental CT findings: 3.2 cm right adrenal mass has low attenuation (0 Hounsfield units) consistent with benign adrenal adenoma. No hypermetabolism on PET imaging. There is mild atherosclerotic calcification of the abdominal aorta without aneurysm. SKELETON: No focal hypermetabolic activity to suggest skeletal metastasis. Incidental CT findings: No worrisome lytic or sclerotic osseous abnormality. IMPRESSION: 1. Hypermetabolic left cervical, mediastinal, and left hilar nodal disease compatible with metastases or lymphoproliferative disorder. 2. Small anterior left  upper lobe pulmonary nodule is hypermetabolic. This could represent primary neoplasm or metastatic disease. 3. Symmetric uptake in the tonsillar regions of the oropharynx, nonspecific. Direct visualization may be warranted to exclude mucosal lesion. 4. 3.2 cm benign right adrenal adenoma. 5.  Aortic Atherosclerois (ICD10-170.0) 6.  Emphysema. (OVF64-P32.9) Electronically Signed   By: Misty Stanley M.D.   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  We discussed the patient's diagnosis of locally advanced lung cancer and general treatment for this, highlighting the role of radiotherapy in the management.  We discussed the available radiation techniques, and focused on the details of logistics and delivery.    I recommend that she pursue concurrent chemoradiation and she is already scheduled to start her chemotherapy in the near future.  I have personally corresponded with Dr. Valeta Harms and Dr. Julien Nordmann about her work-up and treatment plans. I updated them on the bx results to her neck/parotid mass showing a Warthin's tumor - this reduces her staging to T1aN2, Stage IIIA rather than IIIB.  I recommend that she pursue 6-1/2 weeks of radiation therapy to the left upper lung and involved thoracic nodes  We talked about  the treatment planning process and we will perform her CT simulation as soon as possible (tomorrow)  We discussed the risks, benefits, and side effects of radiotherapy. Side effects may include but not necessarily be limited to: Skin irritation, fatigue, cough, esophagitis, injury to the thoracic structures including but not necessarily limited to brachial plexus, lung, heart, esophagus, spinal cord; no guarantees of treatment were given. A consent form was signed and placed in the patient's medical record.  No guarantees of treatment were given.  I strongly believe that the  potential risks of treatment are outweighed by the potential benefits of treatment.  The patient was encouraged to ask questions that I answered to the best of my ability.    We will try to turn her plan around swiftly to start her treatment in the near future.  If she starts chemotherapy as scheduled it may precede the start of radiation.  On date of service, in total, I spent 60 minutes on this encounter. Patient was seen in person.   __________________________________________   Eppie Gibson, MD  This document serves as a record of services personally performed by Eppie Gibson, MD. It was created on her behalf by Roney Mans, a trained medical scribe. The creation of this record is based on the scribe's personal observations and the provider's statements to them. This document has been checked and approved by the attending provider.

## 2022-02-26 ENCOUNTER — Telehealth: Payer: Self-pay | Admitting: Internal Medicine

## 2022-02-26 ENCOUNTER — Encounter: Payer: Self-pay | Admitting: Radiation Oncology

## 2022-02-26 ENCOUNTER — Ambulatory Visit
Admission: RE | Admit: 2022-02-26 | Discharge: 2022-02-26 | Disposition: A | Discharge status: Home / Self Care | Payer: Medicare Other | Source: Ambulatory Visit | Attending: Radiation Oncology | Admitting: Radiation Oncology

## 2022-02-26 DIAGNOSIS — C3412 Malignant neoplasm of upper lobe, left bronchus or lung: Secondary | ICD-10-CM | POA: Insufficient documentation

## 2022-02-26 DIAGNOSIS — Z79899 Other long term (current) drug therapy: Secondary | ICD-10-CM | POA: Diagnosis not present

## 2022-02-26 DIAGNOSIS — D3501 Benign neoplasm of right adrenal gland: Secondary | ICD-10-CM | POA: Diagnosis not present

## 2022-02-26 DIAGNOSIS — C770 Secondary and unspecified malignant neoplasm of lymph nodes of head, face and neck: Secondary | ICD-10-CM | POA: Insufficient documentation

## 2022-02-26 DIAGNOSIS — Z87891 Personal history of nicotine dependence: Secondary | ICD-10-CM | POA: Diagnosis not present

## 2022-02-26 NOTE — Telephone Encounter (Signed)
Scheduled per los, called patient regarding ALL upcoming appointments. Patient is notified.

## 2022-02-26 NOTE — Progress Notes (Signed)
Has armband been applied?  Yes.    Does patient have an allergy to IV contrast dye?: No.   Has patient ever received premedication for IV contrast dye?: No.   Date of lab work: February 21, 2022 BUN: 23 CR: 1.14 eGFR: 50  Does patient take metformin?: No.  Is eGFR >60?: No. If no, when can patient resume? (Must be 48 hrs AFTER they receive IV contrast):  N/A  IV site: {iv locations:[id number]}  Has IV site been added to flowsheet?  {yes no:[id number]}  There were no vitals taken for this visit.

## 2022-02-27 ENCOUNTER — Other Ambulatory Visit: Payer: Self-pay

## 2022-02-27 ENCOUNTER — Ambulatory Visit
Admission: RE | Admit: 2022-02-27 | Discharge: 2022-02-27 | Disposition: A | Discharge status: Home / Self Care | Payer: Medicare Other | Source: Ambulatory Visit | Attending: Radiation Oncology | Admitting: Radiation Oncology

## 2022-02-27 ENCOUNTER — Inpatient Hospital Stay: Payer: Medicare Other

## 2022-02-27 VITALS — BP 103/68 | HR 58 | Temp 97.8°F | Resp 18 | Ht 64.0 in | Wt 171.6 lb

## 2022-02-27 DIAGNOSIS — Z510 Encounter for antineoplastic radiation therapy: Secondary | ICD-10-CM | POA: Insufficient documentation

## 2022-02-27 DIAGNOSIS — C3412 Malignant neoplasm of upper lobe, left bronchus or lung: Secondary | ICD-10-CM | POA: Insufficient documentation

## 2022-02-27 DIAGNOSIS — Z5111 Encounter for antineoplastic chemotherapy: Secondary | ICD-10-CM | POA: Insufficient documentation

## 2022-02-27 DIAGNOSIS — M47812 Spondylosis without myelopathy or radiculopathy, cervical region: Secondary | ICD-10-CM | POA: Insufficient documentation

## 2022-02-27 DIAGNOSIS — C778 Secondary and unspecified malignant neoplasm of lymph nodes of multiple regions: Secondary | ICD-10-CM | POA: Insufficient documentation

## 2022-02-27 DIAGNOSIS — M2550 Pain in unspecified joint: Secondary | ICD-10-CM | POA: Insufficient documentation

## 2022-02-27 DIAGNOSIS — Z79899 Other long term (current) drug therapy: Secondary | ICD-10-CM | POA: Insufficient documentation

## 2022-02-27 DIAGNOSIS — Z87891 Personal history of nicotine dependence: Secondary | ICD-10-CM | POA: Diagnosis not present

## 2022-02-27 MED ORDER — SODIUM CHLORIDE 0.9% FLUSH
10.0000 mL | INTRAVENOUS | Status: DC | PRN
  Administered 2022-02-27: 10 mL via INTRAVENOUS

## 2022-02-27 NOTE — Progress Notes (Signed)
The pharmacy team has substituted IV diphenhydramine for IV cetirizine as a premedication. Patient will be monitored for hypersensitivity reaction and adverse reactions to IV cetirizine. Thanks.   Kennith Center, Pharm.D., CPP 02/27/2022@4 :31 PM

## 2022-02-27 NOTE — Progress Notes (Signed)
Oncology Nurse Navigator Documentation   At the request of Dr. Isidore Moos I have cancelled her upcoming appointment with [facility] on 04/16/22. She no longer needs an appointment with ENT. I have notified Ms. Colasurdo as well that she no longer needs to go to that appointment on 11/21.  Harlow Asa RN, BSN, OCN Head & Neck Oncology Nurse [place] at [facility] Phone # [phone]  Fax # [phone]

## 2022-02-28 ENCOUNTER — Other Ambulatory Visit: Payer: Self-pay

## 2022-03-01 ENCOUNTER — Encounter: Payer: Self-pay | Admitting: Internal Medicine

## 2022-03-01 DIAGNOSIS — Z79899 Other long term (current) drug therapy: Secondary | ICD-10-CM | POA: Diagnosis not present

## 2022-03-01 DIAGNOSIS — M47812 Spondylosis without myelopathy or radiculopathy, cervical region: Secondary | ICD-10-CM | POA: Diagnosis not present

## 2022-03-01 DIAGNOSIS — C3412 Malignant neoplasm of upper lobe, left bronchus or lung: Secondary | ICD-10-CM | POA: Diagnosis not present

## 2022-03-01 DIAGNOSIS — Z510 Encounter for antineoplastic radiation therapy: Secondary | ICD-10-CM | POA: Diagnosis not present

## 2022-03-01 DIAGNOSIS — Z5111 Encounter for antineoplastic chemotherapy: Secondary | ICD-10-CM | POA: Diagnosis not present

## 2022-03-01 DIAGNOSIS — Z87891 Personal history of nicotine dependence: Secondary | ICD-10-CM | POA: Diagnosis not present

## 2022-03-01 DIAGNOSIS — C778 Secondary and unspecified malignant neoplasm of lymph nodes of multiple regions: Secondary | ICD-10-CM | POA: Diagnosis not present

## 2022-03-01 MED FILL — Dexamethasone Sodium Phosphate Inj 100 MG/10ML: INTRAMUSCULAR | Qty: 1 | Status: AC

## 2022-03-01 NOTE — Progress Notes (Signed)
Called pt to introduce myself as her Arboriculturist and to discuss the [employer].  I went over what it covers and gave her the income requirement.  She would like to apply so she will bring her proof of income on 03/04/22.  If approved I will give her an expense sheet and my card for any questions or concerns she may have in the future.

## 2022-03-04 ENCOUNTER — Other Ambulatory Visit: Payer: Self-pay | Admitting: Medical Oncology

## 2022-03-04 ENCOUNTER — Telehealth: Payer: Self-pay | Admitting: Medical Oncology

## 2022-03-04 ENCOUNTER — Inpatient Hospital Stay: Payer: Medicare Other

## 2022-03-04 ENCOUNTER — Other Ambulatory Visit: Payer: Self-pay

## 2022-03-04 ENCOUNTER — Inpatient Hospital Stay: Payer: Medicare Other | Admitting: Licensed Clinical Social Worker

## 2022-03-04 ENCOUNTER — Encounter: Payer: Self-pay | Admitting: Internal Medicine

## 2022-03-04 VITALS — BP 124/82 | HR 57 | Temp 97.7°F | Resp 18

## 2022-03-04 DIAGNOSIS — Z510 Encounter for antineoplastic radiation therapy: Secondary | ICD-10-CM | POA: Diagnosis not present

## 2022-03-04 DIAGNOSIS — C3490 Malignant neoplasm of unspecified part of unspecified bronchus or lung: Secondary | ICD-10-CM | POA: Diagnosis not present

## 2022-03-04 DIAGNOSIS — C3492 Malignant neoplasm of unspecified part of left bronchus or lung: Secondary | ICD-10-CM

## 2022-03-04 DIAGNOSIS — Z5111 Encounter for antineoplastic chemotherapy: Secondary | ICD-10-CM | POA: Diagnosis not present

## 2022-03-04 DIAGNOSIS — C3412 Malignant neoplasm of upper lobe, left bronchus or lung: Secondary | ICD-10-CM | POA: Diagnosis not present

## 2022-03-04 DIAGNOSIS — M47812 Spondylosis without myelopathy or radiculopathy, cervical region: Secondary | ICD-10-CM | POA: Diagnosis not present

## 2022-03-04 DIAGNOSIS — Z79899 Other long term (current) drug therapy: Secondary | ICD-10-CM | POA: Diagnosis not present

## 2022-03-04 DIAGNOSIS — C778 Secondary and unspecified malignant neoplasm of lymph nodes of multiple regions: Secondary | ICD-10-CM | POA: Diagnosis not present

## 2022-03-04 LAB — CBC WITH DIFFERENTIAL (CANCER CENTER ONLY)
Abs Immature Granulocytes: 0.01 10*3/uL (ref 0.00–0.07)
Basophils Absolute: 0 10*3/uL (ref 0.0–0.1)
Basophils Relative: 1 %
Eosinophils Absolute: 0.2 10*3/uL (ref 0.0–0.5)
Eosinophils Relative: 5 %
HCT: 35.4 % — ABNORMAL LOW (ref 36.0–46.0)
Hemoglobin: 11.9 g/dL — ABNORMAL LOW (ref 12.0–15.0)
Immature Granulocytes: 0 %
Lymphocytes Relative: 38 %
Lymphs Abs: 1.7 10*3/uL (ref 0.7–4.0)
MCH: 28.7 pg (ref 26.0–34.0)
MCHC: 33.6 g/dL (ref 30.0–36.0)
MCV: 85.3 fL (ref 80.0–100.0)
Monocytes Absolute: 0.3 10*3/uL (ref 0.1–1.0)
Monocytes Relative: 6 %
Neutro Abs: 2.2 10*3/uL (ref 1.7–7.7)
Neutrophils Relative %: 50 %
Platelet Count: 259 10*3/uL (ref 150–400)
RBC: 4.15 MIL/uL (ref 3.87–5.11)
RDW: 13.2 % (ref 11.5–15.5)
WBC Count: 4.4 10*3/uL (ref 4.0–10.5)
nRBC: 0 % (ref 0.0–0.2)

## 2022-03-04 LAB — CMP (CANCER CENTER ONLY)
ALT: 13 U/L (ref 0–44)
AST: 12 U/L — ABNORMAL LOW (ref 15–41)
Albumin: 4.2 g/dL (ref 3.5–5.0)
Alkaline Phosphatase: 68 U/L (ref 38–126)
Anion gap: 6 (ref 5–15)
BUN: 21 mg/dL (ref 8–23)
CO2: 26 mmol/L (ref 22–32)
Calcium: 9.5 mg/dL (ref 8.9–10.3)
Chloride: 104 mmol/L (ref 98–111)
Creatinine: 1.21 mg/dL — ABNORMAL HIGH (ref 0.44–1.00)
GFR, Estimated: 47 mL/min — ABNORMAL LOW (ref >60)
Glucose, Bld: 132 mg/dL — ABNORMAL HIGH (ref 70–99)
Potassium: 4.1 mmol/L (ref 3.5–5.1)
Sodium: 136 mmol/L (ref 135–145)
Total Bilirubin: 0.4 mg/dL (ref 0.3–1.2)
Total Protein: 8 g/dL (ref 6.5–8.1)

## 2022-03-04 MED ORDER — CETIRIZINE HCL 10 MG/ML IV SOLN
10.0000 mg | Freq: Once | INTRAVENOUS | Status: AC
  Administered 2022-03-04: 10 mg via INTRAVENOUS
  Filled 2022-03-04: qty 1

## 2022-03-04 MED ORDER — SODIUM CHLORIDE 0.9 % IV SOLN: Freq: Once | INTRAVENOUS | Status: AC

## 2022-03-04 MED ORDER — FAMOTIDINE IN NACL 20-0.9 MG/50ML-% IV SOLN
20.0000 mg | Freq: Once | INTRAVENOUS | Status: AC
  Administered 2022-03-04: 20 mg via INTRAVENOUS
  Filled 2022-03-04: qty 50

## 2022-03-04 MED ORDER — PALONOSETRON HCL INJECTION 0.25 MG/5ML
0.2500 mg | Freq: Once | INTRAVENOUS | Status: AC
  Administered 2022-03-04: 0.25 mg via INTRAVENOUS
  Filled 2022-03-04: qty 5

## 2022-03-04 MED ORDER — SODIUM CHLORIDE 0.9 % IV SOLN
10.0000 mg | Freq: Once | INTRAVENOUS | Status: AC
  Administered 2022-03-04: 10 mg via INTRAVENOUS
  Filled 2022-03-04: qty 10

## 2022-03-04 MED ORDER — SODIUM CHLORIDE 0.9 % IV SOLN
147.2000 mg | Freq: Once | INTRAVENOUS | Status: AC
  Administered 2022-03-04: 150 mg via INTRAVENOUS
  Filled 2022-03-04: qty 15

## 2022-03-04 MED ORDER — SODIUM CHLORIDE 0.9 % IV SOLN
45.0000 mg/m2 | Freq: Once | INTRAVENOUS | Status: AC
  Administered 2022-03-04: 84 mg via INTRAVENOUS
  Filled 2022-03-04: qty 14

## 2022-03-04 NOTE — Progress Notes (Signed)
Pt is approved for the $1000 [employer] grant.  

## 2022-03-04 NOTE — Telephone Encounter (Signed)
I received Advanced Directives , MOST and DNR forms from pt.  Order sent to Social worker to notarize the [employer]

## 2022-03-04 NOTE — Progress Notes (Signed)
[place] [facility] Clinical Social Work  Patient brought in advance directive paperwork for notarization.  Clinical Social Worker met with patient and spouse.  The patient designated KYMORAH KORF (spouse) as their primary healthcare agent and did not assign a secondary agent.  Patient also completed healthcare living will.    Documents were notarized and copies made for patient/family. Clinical Social Worker will send documents to medical records to be scanned into patient's chart. Clinical Social Worker encouraged patient/family to contact with any additional questions or concerns.   [place], [place] Worker [employer]

## 2022-03-04 NOTE — Patient Instructions (Signed)
[facility]  Discharge Instructions: Thank you for choosing [facility] to provide your oncology and hematology care.   If you have a lab appointment with the [facility], please go directly to the [facility] and check in at the registration area.   Wear comfortable clothing and clothing appropriate for easy access to any Portacath or PICC line.   We strive to give you quality time with your provider. You may need to reschedule your appointment if you arrive late (15 or more minutes).  Arriving late affects you and other patients whose appointments are after yours.  Also, if you miss three or more appointments without notifying the office, you may be dismissed from the clinic at the provider's discretion.      For prescription refill requests, have your pharmacy contact our office and allow 72 hours for refills to be completed.    Today you received the following chemotherapy and/or immunotherapy agents Taxol, Carbo      To help prevent nausea and vomiting after your treatment, we encourage you to take your nausea medication as directed.  BELOW ARE SYMPTOMS THAT SHOULD BE REPORTED IMMEDIATELY: *FEVER GREATER THAN 100.4 F (38 C) OR HIGHER *CHILLS OR SWEATING *NAUSEA AND VOMITING THAT IS NOT CONTROLLED WITH YOUR NAUSEA MEDICATION *UNUSUAL SHORTNESS OF BREATH *UNUSUAL BRUISING OR BLEEDING *URINARY PROBLEMS (pain or burning when urinating, or frequent urination) *BOWEL PROBLEMS (unusual diarrhea, constipation, pain near the anus) TENDERNESS IN MOUTH AND THROAT WITH OR WITHOUT PRESENCE OF ULCERS (sore throat, sores in mouth, or a toothache) UNUSUAL RASH, SWELLING OR PAIN  UNUSUAL VAGINAL DISCHARGE OR ITCHING   Items with * indicate a potential emergency and should be followed up as soon as possible or go to the Emergency Department if any problems should occur.  Please show the CHEMOTHERAPY ALERT CARD or IMMUNOTHERAPY ALERT CARD at check-in  to the Emergency Department and triage nurse.  Should you have questions after your visit or need to cancel or reschedule your appointment, please contact [facility]  Dept: [phone]  and follow the prompts.  Office hours are 8:00 a.m. to 4:30 p.m. Monday - Friday. Please note that voicemails left after 4:00 p.m. may not be returned until the following business day.  We are closed weekends and major holidays. You have access to a nurse at all times for urgent questions. Please call the main number to the clinic Dept: [phone] and follow the prompts.   For any non-urgent questions, you may also contact your provider using MyChart. We now offer e-Visits for anyone 44 and older to request care online for non-urgent symptoms. For details visit [email or web address].   Also download the MyChart app! Go to the app store, search "MyChart", open the app, select [facility], and log in with your MyChart username and password.  Masks are optional in the cancer centers. If you would like for your care team to wear a mask while they are taking care of you, please let them know. You may have one support person who is at least 74 years old accompany you for your appointments.  Paclitaxel Injection What is this medication? PACLITAXEL (PAK li TAX el) treats some types of cancer. It works by slowing down the growth of cancer cells. This medicine may be used for other purposes; ask your health care provider or pharmacist if you have questions. COMMON BRAND NAME(S): Onxol, Taxol What should I tell my care team before I take  this medication? They need to know if you have any of these conditions: Heart disease Liver disease Low white blood cell levels An unusual or allergic reaction to paclitaxel, other medications, foods, dyes, or preservatives If you or your partner are pregnant or trying to get pregnant Breast-feeding How should I use this medication? This medication is  injected into a vein. It is given by your care team in a hospital or clinic setting. Talk to your care team about the use of this medication in children. While it may be given to children for selected conditions, precautions do apply. Overdosage: If you think you have taken too much of this medicine contact a poison control center or emergency room at once. NOTE: This medicine is only for you. Do not share this medicine with others. What if I miss a dose? Keep appointments for follow-up doses. It is important not to miss your dose. Call your care team if you are unable to keep an appointment. What may interact with this medication? Do not take this medication with any of the following: Live virus vaccines Other medications may affect the way this medication works. Talk with your care team about all of the medications you take. They may suggest changes to your treatment plan to lower the risk of side effects and to make sure your medications work as intended. This list may not describe all possible interactions. Give your health care provider a list of all the medicines, herbs, non-prescription drugs, or dietary supplements you use. Also tell them if you smoke, drink alcohol, or use illegal drugs. Some items may interact with your medicine. What should I watch for while using this medication? Your condition will be monitored carefully while you are receiving this medication. You may need blood work while taking this medication. This medication may make you feel generally unwell. This is not uncommon as chemotherapy can affect healthy cells as well as cancer cells. Report any side effects. Continue your course of treatment even though you feel ill unless your care team tells you to stop. This medication can cause serious allergic reactions. To reduce the risk, your care team may give you other medications to take before receiving this one. Be sure to follow the directions from your care team. This  medication may increase your risk of getting an infection. Call your care team for advice if you get a fever, chills, sore throat, or other symptoms of a cold or flu. Do not treat yourself. Try to avoid being around people who are sick. This medication may increase your risk to bruise or bleed. Call your care team if you notice any unusual bleeding. Be careful brushing or flossing your teeth or using a toothpick because you may get an infection or bleed more easily. If you have any dental work done, tell your dentist you are receiving this medication. Talk to your care team if you may be pregnant. Serious birth defects can occur if you take this medication during pregnancy. Talk to your care team before breastfeeding. Changes to your treatment plan may be needed. What side effects may I notice from receiving this medication? Side effects that you should report to your care team as soon as possible: Allergic reactions--skin rash, itching, hives, swelling of the face, lips, tongue, or throat Heart rhythm changes--fast or irregular heartbeat, dizziness, feeling faint or lightheaded, chest pain, trouble breathing Increase in blood pressure Infection--fever, chills, cough, sore throat, wounds that don't heal, pain or trouble when passing urine, general feeling  of discomfort or being unwell Low blood pressure--dizziness, feeling faint or lightheaded, blurry vision Low red blood cell level--unusual weakness or fatigue, dizziness, headache, trouble breathing Painful swelling, warmth, or redness of the skin, blisters or sores at the infusion site Pain, tingling, or numbness in the hands or feet Slow heartbeat--dizziness, feeling faint or lightheaded, confusion, trouble breathing, unusual weakness or fatigue Unusual bruising or bleeding Side effects that usually do not require medical attention (report to your care team if they continue or are bothersome): Diarrhea Hair loss Joint pain Loss of  appetite Muscle pain Nausea Vomiting This list may not describe all possible side effects. Call your doctor for medical advice about side effects. You may report side effects to FDA at 1-800-FDA-1088. Where should I keep my medication? This medication is given in a hospital or clinic. It will not be stored at home. NOTE: This sheet is a summary. It may not cover all possible information. If you have questions about this medicine, talk to your doctor, pharmacist, or health care provider.  2023 Elsevier/Gold Standard (2021-09-27 00:00:00)  Carboplatin Injection What is this medication? CARBOPLATIN (KAR boe pla tin) treats some types of cancer. It works by slowing down the growth of cancer cells. This medicine may be used for other purposes; ask your health care provider or pharmacist if you have questions. COMMON BRAND NAME(S): Paraplatin What should I tell my care team before I take this medication? They need to know if you have any of these conditions: Blood disorders Hearing problems Kidney disease Recent or ongoing radiation therapy An unusual or allergic reaction to carboplatin, cisplatin, other medications, foods, dyes, or preservatives Pregnant or trying to get pregnant Breast-feeding How should I use this medication? This medication is injected into a vein. It is given by your care team in a hospital or clinic setting. Talk to your care team about the use of this medication in children. Special care may be needed. Overdosage: If you think you have taken too much of this medicine contact a poison control center or emergency room at once. NOTE: This medicine is only for you. Do not share this medicine with others. What if I miss a dose? Keep appointments for follow-up doses. It is important not to miss your dose. Call your care team if you are unable to keep an appointment. What may interact with this medication? Medications for seizures Some antibiotics, such as amikacin,  gentamicin, neomycin, streptomycin, tobramycin Vaccines This list may not describe all possible interactions. Give your health care provider a list of all the medicines, herbs, non-prescription drugs, or dietary supplements you use. Also tell them if you smoke, drink alcohol, or use illegal drugs. Some items may interact with your medicine. What should I watch for while using this medication? Your condition will be monitored carefully while you are receiving this medication. You may need blood work while taking this medication. This medication may make you feel generally unwell. This is not uncommon, as chemotherapy can affect healthy cells as well as cancer cells. Report any side effects. Continue your course of treatment even though you feel ill unless your care team tells you to stop. In some cases, you may be given additional medications to help with side effects. Follow all directions for their use. This medication may increase your risk of getting an infection. Call your care team for advice if you get a fever, chills, sore throat, or other symptoms of a cold or flu. Do not treat yourself. Try to avoid  being around people who are sick. Avoid taking medications that contain aspirin, acetaminophen, ibuprofen, naproxen, or ketoprofen unless instructed by your care team. These medications may hide a fever. Be careful brushing or flossing your teeth or using a toothpick because you may get an infection or bleed more easily. If you have any dental work done, tell your dentist you are receiving this medication. Talk to your care team if you wish to become pregnant or think you might be pregnant. This medication can cause serious birth defects. Talk to your care team about effective forms of contraception. Do not breast-feed while taking this medication. What side effects may I notice from receiving this medication? Side effects that you should report to your care team as soon as possible: Allergic  reactions--skin rash, itching, hives, swelling of the face, lips, tongue, or throat Infection--fever, chills, cough, sore throat, wounds that don't heal, pain or trouble when passing urine, general feeling of discomfort or being unwell Low red blood cell level--unusual weakness or fatigue, dizziness, headache, trouble breathing Pain, tingling, or numbness in the hands or feet, muscle weakness, change in vision, confusion or trouble speaking, loss of balance or coordination, trouble walking, seizures Unusual bruising or bleeding Side effects that usually do not require medical attention (report to your care team if they continue or are bothersome): Hair loss Nausea Unusual weakness or fatigue Vomiting This list may not describe all possible side effects. Call your doctor for medical advice about side effects. You may report side effects to FDA at 1-800-FDA-1088. Where should I keep my medication? This medication is given in a hospital or clinic. It will not be stored at home. NOTE: This sheet is a summary. It may not cover all possible information. If you have questions about this medicine, talk to your doctor, pharmacist, or health care provider.  2023 Elsevier/Gold Standard (2021-09-04 00:00:00)

## 2022-03-05 ENCOUNTER — Ambulatory Visit
Admission: RE | Admit: 2022-03-05 | Discharge: 2022-03-05 | Disposition: A | Discharge status: Home / Self Care | Payer: Medicare Other | Source: Ambulatory Visit | Attending: Radiation Oncology | Admitting: Radiation Oncology

## 2022-03-05 ENCOUNTER — Other Ambulatory Visit: Payer: Self-pay

## 2022-03-05 ENCOUNTER — Encounter (HOSPITAL_COMMUNITY): Payer: Self-pay

## 2022-03-05 DIAGNOSIS — Z510 Encounter for antineoplastic radiation therapy: Secondary | ICD-10-CM | POA: Diagnosis not present

## 2022-03-05 DIAGNOSIS — C778 Secondary and unspecified malignant neoplasm of lymph nodes of multiple regions: Secondary | ICD-10-CM | POA: Diagnosis not present

## 2022-03-05 DIAGNOSIS — M47812 Spondylosis without myelopathy or radiculopathy, cervical region: Secondary | ICD-10-CM | POA: Diagnosis not present

## 2022-03-05 DIAGNOSIS — C3412 Malignant neoplasm of upper lobe, left bronchus or lung: Secondary | ICD-10-CM | POA: Diagnosis not present

## 2022-03-05 DIAGNOSIS — Z5111 Encounter for antineoplastic chemotherapy: Secondary | ICD-10-CM | POA: Diagnosis not present

## 2022-03-05 DIAGNOSIS — Z79899 Other long term (current) drug therapy: Secondary | ICD-10-CM | POA: Diagnosis not present

## 2022-03-05 DIAGNOSIS — Z87891 Personal history of nicotine dependence: Secondary | ICD-10-CM | POA: Diagnosis not present

## 2022-03-05 LAB — RAD ONC ARIA SESSION SUMMARY
Course Elapsed Days: 0
Plan Fractions Treated to Date: 1
Plan Prescribed Dose Per Fraction: 2 Gy
Plan Total Fractions Prescribed: 33
Plan Total Prescribed Dose: 66 Gy
Reference Point Dosage Given to Date: 2 Gy
Reference Point Session Dosage Given: 2 Gy
Session Number: 1

## 2022-03-06 ENCOUNTER — Other Ambulatory Visit: Payer: Self-pay

## 2022-03-06 ENCOUNTER — Ambulatory Visit
Admission: RE | Admit: 2022-03-06 | Discharge: 2022-03-06 | Disposition: A | Discharge status: Home / Self Care | Payer: Medicare Other | Source: Ambulatory Visit | Attending: Radiation Oncology | Admitting: Radiation Oncology

## 2022-03-06 DIAGNOSIS — Z87891 Personal history of nicotine dependence: Secondary | ICD-10-CM | POA: Diagnosis not present

## 2022-03-06 DIAGNOSIS — C778 Secondary and unspecified malignant neoplasm of lymph nodes of multiple regions: Secondary | ICD-10-CM | POA: Diagnosis not present

## 2022-03-06 DIAGNOSIS — Z510 Encounter for antineoplastic radiation therapy: Secondary | ICD-10-CM | POA: Diagnosis not present

## 2022-03-06 DIAGNOSIS — C3412 Malignant neoplasm of upper lobe, left bronchus or lung: Secondary | ICD-10-CM | POA: Diagnosis not present

## 2022-03-06 DIAGNOSIS — M47812 Spondylosis without myelopathy or radiculopathy, cervical region: Secondary | ICD-10-CM | POA: Diagnosis not present

## 2022-03-06 DIAGNOSIS — Z5111 Encounter for antineoplastic chemotherapy: Secondary | ICD-10-CM | POA: Diagnosis not present

## 2022-03-06 DIAGNOSIS — Z79899 Other long term (current) drug therapy: Secondary | ICD-10-CM | POA: Diagnosis not present

## 2022-03-06 LAB — RAD ONC ARIA SESSION SUMMARY
Course Elapsed Days: 1
Plan Fractions Treated to Date: 2
Plan Prescribed Dose Per Fraction: 2 Gy
Plan Total Fractions Prescribed: 33
Plan Total Prescribed Dose: 66 Gy
Reference Point Dosage Given to Date: 4 Gy
Reference Point Session Dosage Given: 2 Gy
Session Number: 2

## 2022-03-07 ENCOUNTER — Other Ambulatory Visit: Payer: Self-pay

## 2022-03-07 ENCOUNTER — Ambulatory Visit
Admission: RE | Admit: 2022-03-07 | Discharge: 2022-03-07 | Disposition: A | Discharge status: Home / Self Care | Payer: Medicare Other | Source: Ambulatory Visit | Attending: Radiation Oncology | Admitting: Radiation Oncology

## 2022-03-07 DIAGNOSIS — Z79899 Other long term (current) drug therapy: Secondary | ICD-10-CM | POA: Diagnosis not present

## 2022-03-07 DIAGNOSIS — Z510 Encounter for antineoplastic radiation therapy: Secondary | ICD-10-CM | POA: Diagnosis not present

## 2022-03-07 DIAGNOSIS — C3412 Malignant neoplasm of upper lobe, left bronchus or lung: Secondary | ICD-10-CM | POA: Diagnosis not present

## 2022-03-07 DIAGNOSIS — C778 Secondary and unspecified malignant neoplasm of lymph nodes of multiple regions: Secondary | ICD-10-CM | POA: Diagnosis not present

## 2022-03-07 DIAGNOSIS — Z87891 Personal history of nicotine dependence: Secondary | ICD-10-CM | POA: Diagnosis not present

## 2022-03-07 DIAGNOSIS — Z5111 Encounter for antineoplastic chemotherapy: Secondary | ICD-10-CM | POA: Diagnosis not present

## 2022-03-07 DIAGNOSIS — M47812 Spondylosis without myelopathy or radiculopathy, cervical region: Secondary | ICD-10-CM | POA: Diagnosis not present

## 2022-03-07 LAB — RAD ONC ARIA SESSION SUMMARY
Course Elapsed Days: 2
Plan Fractions Treated to Date: 3
Plan Prescribed Dose Per Fraction: 2 Gy
Plan Total Fractions Prescribed: 33
Plan Total Prescribed Dose: 66 Gy
Reference Point Dosage Given to Date: 6 Gy
Reference Point Session Dosage Given: 2 Gy
Session Number: 3

## 2022-03-08 ENCOUNTER — Ambulatory Visit
Admission: RE | Admit: 2022-03-08 | Discharge: 2022-03-08 | Disposition: A | Discharge status: Home / Self Care | Payer: Medicare Other | Source: Ambulatory Visit | Attending: Radiation Oncology | Admitting: Radiation Oncology

## 2022-03-08 ENCOUNTER — Other Ambulatory Visit: Payer: Self-pay

## 2022-03-08 DIAGNOSIS — C778 Secondary and unspecified malignant neoplasm of lymph nodes of multiple regions: Secondary | ICD-10-CM | POA: Diagnosis not present

## 2022-03-08 DIAGNOSIS — C3412 Malignant neoplasm of upper lobe, left bronchus or lung: Secondary | ICD-10-CM | POA: Diagnosis not present

## 2022-03-08 DIAGNOSIS — Z5111 Encounter for antineoplastic chemotherapy: Secondary | ICD-10-CM | POA: Diagnosis not present

## 2022-03-08 DIAGNOSIS — Z510 Encounter for antineoplastic radiation therapy: Secondary | ICD-10-CM | POA: Diagnosis not present

## 2022-03-08 DIAGNOSIS — Z87891 Personal history of nicotine dependence: Secondary | ICD-10-CM | POA: Diagnosis not present

## 2022-03-08 DIAGNOSIS — Z79899 Other long term (current) drug therapy: Secondary | ICD-10-CM | POA: Diagnosis not present

## 2022-03-08 DIAGNOSIS — M47812 Spondylosis without myelopathy or radiculopathy, cervical region: Secondary | ICD-10-CM | POA: Diagnosis not present

## 2022-03-08 LAB — RAD ONC ARIA SESSION SUMMARY
Course Elapsed Days: 3
Plan Fractions Treated to Date: 4
Plan Prescribed Dose Per Fraction: 2 Gy
Plan Total Fractions Prescribed: 33
Plan Total Prescribed Dose: 66 Gy
Reference Point Dosage Given to Date: 8 Gy
Reference Point Session Dosage Given: 2 Gy
Session Number: 4

## 2022-03-08 MED FILL — Dexamethasone Sodium Phosphate Inj 100 MG/10ML: INTRAMUSCULAR | Qty: 1 | Status: AC

## 2022-03-11 ENCOUNTER — Ambulatory Visit
Admission: RE | Admit: 2022-03-11 | Discharge: 2022-03-11 | Disposition: A | Discharge status: Home / Self Care | Payer: Medicare Other | Source: Ambulatory Visit | Attending: Radiation Oncology | Admitting: Radiation Oncology

## 2022-03-11 ENCOUNTER — Other Ambulatory Visit: Payer: Self-pay

## 2022-03-11 ENCOUNTER — Inpatient Hospital Stay: Payer: Medicare Other

## 2022-03-11 ENCOUNTER — Encounter: Payer: Self-pay | Admitting: Internal Medicine

## 2022-03-11 ENCOUNTER — Inpatient Hospital Stay (HOSPITAL_BASED_OUTPATIENT_CLINIC_OR_DEPARTMENT_OTHER): Payer: Medicare Other | Admitting: Internal Medicine

## 2022-03-11 VITALS — BP 108/71 | HR 62 | Temp 98.0°F | Resp 16 | Wt 173.9 lb

## 2022-03-11 DIAGNOSIS — C3492 Malignant neoplasm of unspecified part of left bronchus or lung: Secondary | ICD-10-CM

## 2022-03-11 DIAGNOSIS — M47812 Spondylosis without myelopathy or radiculopathy, cervical region: Secondary | ICD-10-CM | POA: Diagnosis not present

## 2022-03-11 DIAGNOSIS — Z510 Encounter for antineoplastic radiation therapy: Secondary | ICD-10-CM | POA: Diagnosis not present

## 2022-03-11 DIAGNOSIS — Z5111 Encounter for antineoplastic chemotherapy: Secondary | ICD-10-CM | POA: Diagnosis not present

## 2022-03-11 DIAGNOSIS — C778 Secondary and unspecified malignant neoplasm of lymph nodes of multiple regions: Secondary | ICD-10-CM | POA: Diagnosis not present

## 2022-03-11 DIAGNOSIS — C3412 Malignant neoplasm of upper lobe, left bronchus or lung: Secondary | ICD-10-CM | POA: Diagnosis not present

## 2022-03-11 DIAGNOSIS — Z79899 Other long term (current) drug therapy: Secondary | ICD-10-CM | POA: Diagnosis not present

## 2022-03-11 DIAGNOSIS — Z87891 Personal history of nicotine dependence: Secondary | ICD-10-CM | POA: Diagnosis not present

## 2022-03-11 LAB — RAD ONC ARIA SESSION SUMMARY
Course Elapsed Days: 6
Plan Fractions Treated to Date: 5
Plan Prescribed Dose Per Fraction: 2 Gy
Plan Total Fractions Prescribed: 33
Plan Total Prescribed Dose: 66 Gy
Reference Point Dosage Given to Date: 10 Gy
Reference Point Session Dosage Given: 2 Gy
Session Number: 5

## 2022-03-11 LAB — CBC WITH DIFFERENTIAL (CANCER CENTER ONLY)
Abs Immature Granulocytes: 0.01 10*3/uL (ref 0.00–0.07)
Basophils Absolute: 0 10*3/uL (ref 0.0–0.1)
Basophils Relative: 1 %
Eosinophils Absolute: 0.3 10*3/uL (ref 0.0–0.5)
Eosinophils Relative: 8 %
HCT: 34 % — ABNORMAL LOW (ref 36.0–46.0)
Hemoglobin: 11.2 g/dL — ABNORMAL LOW (ref 12.0–15.0)
Immature Granulocytes: 0 %
Lymphocytes Relative: 39 %
Lymphs Abs: 1.4 10*3/uL (ref 0.7–4.0)
MCH: 28.4 pg (ref 26.0–34.0)
MCHC: 32.9 g/dL (ref 30.0–36.0)
MCV: 86.3 fL (ref 80.0–100.0)
Monocytes Absolute: 0.3 10*3/uL (ref 0.1–1.0)
Monocytes Relative: 8 %
Neutro Abs: 1.6 10*3/uL — ABNORMAL LOW (ref 1.7–7.7)
Neutrophils Relative %: 44 %
Platelet Count: 260 10*3/uL (ref 150–400)
RBC: 3.94 MIL/uL (ref 3.87–5.11)
RDW: 13.1 % (ref 11.5–15.5)
WBC Count: 3.6 10*3/uL — ABNORMAL LOW (ref 4.0–10.5)
nRBC: 0 % (ref 0.0–0.2)

## 2022-03-11 LAB — CMP (CANCER CENTER ONLY)
ALT: 10 U/L (ref 0–44)
AST: 11 U/L — ABNORMAL LOW (ref 15–41)
Albumin: 4 g/dL (ref 3.5–5.0)
Alkaline Phosphatase: 69 U/L (ref 38–126)
Anion gap: 7 (ref 5–15)
BUN: 26 mg/dL — ABNORMAL HIGH (ref 8–23)
CO2: 24 mmol/L (ref 22–32)
Calcium: 9 mg/dL (ref 8.9–10.3)
Chloride: 105 mmol/L (ref 98–111)
Creatinine: 1.23 mg/dL — ABNORMAL HIGH (ref 0.44–1.00)
GFR, Estimated: 46 mL/min — ABNORMAL LOW (ref >60)
Glucose, Bld: 107 mg/dL — ABNORMAL HIGH (ref 70–99)
Potassium: 4.5 mmol/L (ref 3.5–5.1)
Sodium: 136 mmol/L (ref 135–145)
Total Bilirubin: 0.2 mg/dL — ABNORMAL LOW (ref 0.3–1.2)
Total Protein: 7.3 g/dL (ref 6.5–8.1)

## 2022-03-11 MED ORDER — SONAFINE EX EMUL
1.0000 | Freq: Two times a day (BID) | CUTANEOUS | Status: DC
  Administered 2022-03-11: 1 via TOPICAL

## 2022-03-11 MED ORDER — SODIUM CHLORIDE 0.9 % IV SOLN
10.0000 mg | Freq: Once | INTRAVENOUS | Status: AC
  Administered 2022-03-11: 10 mg via INTRAVENOUS
  Filled 2022-03-11: qty 10

## 2022-03-11 MED ORDER — SODIUM CHLORIDE 0.9 % IV SOLN
147.2000 mg | Freq: Once | INTRAVENOUS | Status: AC
  Administered 2022-03-11: 150 mg via INTRAVENOUS
  Filled 2022-03-11: qty 15

## 2022-03-11 MED ORDER — PALONOSETRON HCL INJECTION 0.25 MG/5ML
0.2500 mg | Freq: Once | INTRAVENOUS | Status: AC
  Administered 2022-03-11: 0.25 mg via INTRAVENOUS
  Filled 2022-03-11: qty 5

## 2022-03-11 MED ORDER — SODIUM CHLORIDE 0.9 % IV SOLN
45.0000 mg/m2 | Freq: Once | INTRAVENOUS | Status: AC
  Administered 2022-03-11: 84 mg via INTRAVENOUS
  Filled 2022-03-11: qty 14

## 2022-03-11 MED ORDER — SODIUM CHLORIDE 0.9 % IV SOLN: Freq: Once | INTRAVENOUS | Status: AC

## 2022-03-11 MED ORDER — CETIRIZINE HCL 10 MG/ML IV SOLN
10.0000 mg | Freq: Once | INTRAVENOUS | Status: AC
  Administered 2022-03-11: 10 mg via INTRAVENOUS
  Filled 2022-03-11: qty 1

## 2022-03-11 MED ORDER — FAMOTIDINE IN NACL 20-0.9 MG/50ML-% IV SOLN
20.0000 mg | Freq: Once | INTRAVENOUS | Status: AC
  Administered 2022-03-11: 20 mg via INTRAVENOUS
  Filled 2022-03-11: qty 50

## 2022-03-11 NOTE — Progress Notes (Signed)
[facility] Telephone:([phone]) [phone]   Fax:(336) [phone]  OFFICE PROGRESS NOTE  [name], [address] [place] [address]  DIAGNOSIS: Stage IIIb (T1a, N3, M0) non-small cell lung cancer, adenosquamous carcinoma presented with left upper lobe pulmonary nodule in addition to left hilar, mediastinal and left supraclavicular and cervical lymphadenopathy diagnosed in September 2023.  Detected Alteration(s) / Biomarker(s) Associated FDA-approved therapies Clinical Trial Availability % cfDNA or Amplification  TP53 R282W None Yes 1.6%  NFE2L2 D29V None Yes 1.2%  TP53 V216M None Yes 0.4%  TMB 18.02 mut/Mb  PDL1 Expression: 80%  PRIOR THERAPY: None  CURRENT THERAPY: A course of concurrent chemoradiation with weekly carboplatin for AUC of 2 and paclitaxel 45 Mg/M2.  First dose March 11, 2022  INTERVAL HISTORY: Yolanda Robertson 76 y.o. female returns to the clinic today for follow-up visit accompanied by her husband.  The patient is feeling fine today with no concerning complaints.  She denied having any chest pain, shortness of breath, cough or hemoptysis.  She has no nausea, vomiting, diarrhea or constipation.  She has pain on the left hip area with radiation to the left leg probably secondary to sciatica.  She denied having any recent weight loss or night sweats.  She is here today for evaluation before starting the first dose of her treatment with concurrent chemoradiation.  MEDICAL HISTORY: Past Medical History:  Diagnosis Date   Hyperlipidemia    Hypertension    Lung mass    left upper lobe    ALLERGIES:  has No Known Allergies.  MEDICATIONS:  Current Outpatient Medications  Medication Sig Dispense Refill   benzonatate (TESSALON) 200 MG capsule Take 1 capsule (200 mg total) by mouth 3 (three) times daily as needed for cough. 30 capsule 1   ibuprofen (ADVIL) 200 MG tablet Take 200 mg by mouth every 8 (eight) hours as needed (pain.).      indapamide (LOZOL) 1.25 MG tablet Take 1.25 mg by mouth in the morning.     Polyvinyl Alcohol-Povidone PF (REFRESH) 1.4-0.6 % SOLN Place 1-2 drops into both eyes 3 (three) times daily as needed (dry/irritated eyes.).     prochlorperazine (COMPAZINE) 10 MG tablet Take 1 tablet (10 mg total) by mouth every 6 (six) hours as needed for nausea or vomiting. 30 tablet 0   rosuvastatin (CRESTOR) 10 MG tablet Take 10 mg by mouth in the morning.     No current facility-administered medications for this visit.    SURGICAL HISTORY:  Past Surgical History:  Procedure Laterality Date   FINE NEEDLE ASPIRATION  02/12/2022   Procedure: FINE NEEDLE ASPIRATION (FNA) LINEAR;  Surgeon: Garner Nash, DO;  Location: [facility];  Service: Pulmonary;;   VAGINAL HYSTERECTOMY     VIDEO BRONCHOSCOPY WITH ENDOBRONCHIAL ULTRASOUND Left 02/12/2022   Procedure: VIDEO BRONCHOSCOPY WITH ENDOBRONCHIAL ULTRASOUND;  Surgeon: Garner Nash, DO;  Location: [facility];  Service: Pulmonary;  Laterality: Left;    REVIEW OF SYSTEMS:  A comprehensive review of systems was negative except for: Musculoskeletal: positive for arthralgias   PHYSICAL EXAMINATION: General appearance: alert, cooperative, and no distress Head: Normocephalic, without obvious abnormality, atraumatic Neck: no adenopathy, no JVD, supple, symmetrical, trachea midline, and thyroid not enlarged, symmetric, no tenderness/mass/nodules Lymph nodes: Cervical, supraclavicular, and axillary nodes normal. Resp: clear to auscultation bilaterally Back: symmetric, no curvature. ROM normal. No CVA tenderness. Cardio: regular rate and rhythm, S1, S2 normal, no murmur, click, rub or gallop GI:  soft, non-tender; bowel sounds normal; no masses,  no organomegaly Extremities: extremities normal, atraumatic, no cyanosis or edema  ECOG PERFORMANCE STATUS: 1 - Symptomatic but completely ambulatory  Blood pressure 108/71, pulse 62, temperature 98 F (36.7 C), temperature  source Oral, resp. rate 16, weight 173 lb 14.4 oz (78.9 kg), SpO2 100 %.  LABORATORY DATA: Lab Results  Component Value Date   WBC 4.4 03/04/2022   HGB 11.9 (L) 03/04/2022   HCT 35.4 (L) 03/04/2022   MCV 85.3 03/04/2022   PLT 259 03/04/2022      Chemistry      Component Value Date/Time   NA 136 03/04/2022 1107   K 4.1 03/04/2022 1107   CL 104 03/04/2022 1107   CO2 26 03/04/2022 1107   BUN 21 03/04/2022 1107   CREATININE 1.21 (H) 03/04/2022 1107      Component Value Date/Time   CALCIUM 9.5 03/04/2022 1107   ALKPHOS 68 03/04/2022 1107   AST 12 (L) 03/04/2022 1107   ALT 13 03/04/2022 1107   BILITOT 0.4 03/04/2022 1107       RADIOGRAPHIC STUDIES: [place] CORE BIOPSY (SALIVARY GLAND/PAROTID GLAND)  Result Date: 02/20/2022 INDICATION: Concern for lymphoproliferative disorder. Please perform ultrasound-guided biopsy of left cervical lymph node for tissue diagnostic purposes. EXAM: ULTRASOUND-GUIDED LEFT CERVICAL LYMPH NODE BIOPSY COMPARISON:  PET-CT-02/01/2022 MEDICATIONS: None ANESTHESIA/SEDATION: Moderate (conscious) sedation was employed during this procedure. A total of Versed 2 mg and Fentanyl 100 mcg was administered intravenously. Moderate Sedation Time: 10 minutes. The patient's level of consciousness and vital signs were monitored continuously by radiology nursing throughout the procedure under my direct supervision. COMPLICATIONS: None immediate. TECHNIQUE: Informed written consent was obtained from the patient after a discussion of the risks, benefits and alternatives to treatment. Questions regarding the procedure were encouraged and answered. Initial ultrasound scanning demonstrated an approximately 2.6 x 1.9 cm malignant appearing high left cervical lymph node (image 4), correlating with the hypermetabolic lymph node seen on preceding PET-CT image 24, series 603. An ultrasound image was saved for documentation purposes. The procedure was planned. A timeout was performed prior to  the initiation of the procedure. The operative was prepped and draped in the usual sterile fashion, and a sterile drape was applied covering the operative field. A timeout was performed prior to the initiation of the procedure. Local anesthesia was provided with 1% lidocaine with epinephrine. Under direct ultrasound guidance, an 18 gauge core needle device was utilized to obtain to obtain 6 core needle biopsies of the hypermetabolic left cervical lymph node. The samples were placed in saline and submitted to pathology. The needle was removed and hemostasis was achieved with manual compression. Post procedure scan was negative for significant hematoma. A dressing was applied. The patient tolerated the procedure well without immediate postprocedural complication. IMPRESSION: Technically successful ultrasound guided biopsy of dominant hypermetabolic left cervical lymph node. Electronically Signed   By: Sandi Mariscal M.D.   On: 02/20/2022 12:34   MR BRAIN W WO CONTRAST  Result Date: 02/11/2022 CLINICAL DATA:  Non-small cell lung cancer staging. EXAM: MRI HEAD WITHOUT AND WITH CONTRAST TECHNIQUE: Multiplanar, multiecho pulse sequences of the brain and surrounding structures were obtained without and with intravenous contrast. CONTRAST:  7.19m GADAVIST GADOBUTROL 1 MMOL/ML IV SOLN COMPARISON:  None Available. FINDINGS: Brain: No acute infarct, hemorrhage, or mass lesion is present. No significant white matter lesions are present. The ventricles are of normal size. No significant extraaxial fluid collection is present. The internal auditory canals are within normal limits.  The brainstem and cerebellum are within normal limits. Postcontrast images demonstrate no pathologic enhancement. Vascular: Flow is present in the major intracranial arteries. Skull and upper cervical spine: Mild degenerative changes are present the upper cervical spine. Craniocervical junction is within normal limits. Midline structures are  unremarkable. Sinuses/Orbits: Small mastoid effusions are present bilaterally. The paranasal sinuses and mastoid air cells are otherwise clear. Bilateral lens replacements are noted. Globes and orbits are otherwise unremarkable. IMPRESSION: 1. Normal MRI appearance of the brain. No evidence for metastatic disease to the brain or meninges. 2. Small mastoid effusions bilaterally. No obstructing nasopharyngeal lesion is evident. Electronically Signed   By: San Morelle M.D.   On: 02/11/2022 16:35    ASSESSMENT AND PLAN: This is a very pleasant 76 years old African-American female with Stage IIIb (T1a, N3, M0) non-small cell lung cancer, adenosquamous carcinoma presented with left upper lobe pulmonary nodule in addition to left hilar, mediastinal and left supraclavicular and cervical lymphadenopathy diagnosed in September 2023. Molecular studies showed no actionable mutations and PD-L1 expression is 80%. The patient also had bronchoscopy by Dr. Valeta Harms and the final pathology was consistent with adenosquamous carcinoma from the left upper lobe lung nodule. 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 The patient is currently undergoing a course of concurrent chemoradiation with weekly carboplatin for AUC of 2 and paclitaxel 45 Mg/M2.  First dose March 11, 2022.  I recommended for the patient to proceed with cycle #1 today as planned. I will see her back for follow-up visit in 2 weeks for evaluation and management of any adverse effect of her treatment before starting cycle #3. The patient was advised to call immediately if she has any other concerning symptoms in the interval. The patient voices understanding of current disease status and treatment options and is in agreement with the current care plan.  All questions were answered. The patient knows to call the clinic with any problems, questions or  concerns. We can certainly see the patient much sooner if necessary.  The total time spent in the appointment was 20 minutes.  Disclaimer: This note was dictated with voice recognition software. Similar sounding words can inadvertently be transcribed and may not be corrected upon review.

## 2022-03-11 NOTE — Patient Instructions (Signed)
[facility] ONCOLOGY  Discharge Instructions: Thank you for choosing [facility] to provide your oncology and hematology care.   If you have a lab appointment with the [facility], please go directly to the [facility] and check in at the registration area.   Wear comfortable clothing and clothing appropriate for easy access to any Portacath or PICC line.   We strive to give you quality time with your provider. You may need to reschedule your appointment if you arrive late (15 or more minutes).  Arriving late affects you and other patients whose appointments are after yours.  Also, if you miss three or more appointments without notifying the office, you may be dismissed from the clinic at the provider's discretion.      For prescription refill requests, have your pharmacy contact our office and allow 72 hours for refills to be completed.    Today you received the following chemotherapy and/or immunotherapy agents: paclitaxel, carboplatin      To help prevent nausea and vomiting after your treatment, we encourage you to take your nausea medication as directed.  BELOW ARE SYMPTOMS THAT SHOULD BE REPORTED IMMEDIATELY: *FEVER GREATER THAN 100.4 F (38 C) OR HIGHER *CHILLS OR SWEATING *NAUSEA AND VOMITING THAT IS NOT CONTROLLED WITH YOUR NAUSEA MEDICATION *UNUSUAL SHORTNESS OF BREATH *UNUSUAL BRUISING OR BLEEDING *URINARY PROBLEMS (pain or burning when urinating, or frequent urination) *BOWEL PROBLEMS (unusual diarrhea, constipation, pain near the anus) TENDERNESS IN MOUTH AND THROAT WITH OR WITHOUT PRESENCE OF ULCERS (sore throat, sores in mouth, or a toothache) UNUSUAL RASH, SWELLING OR PAIN  UNUSUAL VAGINAL DISCHARGE OR ITCHING   Items with * indicate a potential emergency and should be followed up as soon as possible or go to the Emergency Department if any problems should occur.  Please show the CHEMOTHERAPY ALERT CARD or IMMUNOTHERAPY ALERT CARD  at check-in to the Emergency Department and triage nurse.  Should you have questions after your visit or need to cancel or reschedule your appointment, please contact [facility]  Dept: [phone]  and follow the prompts.  Office hours are 8:00 a.m. to 4:30 p.m. Monday - Friday. Please note that voicemails left after 4:00 p.m. may not be returned until the following business day.  We are closed weekends and major holidays. You have access to a nurse at all times for urgent questions. Please call the main number to the clinic Dept: [phone] and follow the prompts.   For any non-urgent questions, you may also contact your provider using MyChart. We now offer e-Visits for anyone 36 and older to request care online for non-urgent symptoms. For details visit [email or web address].   Also download the MyChart app! Go to the app store, search "MyChart", open the app, select [facility], and log in with your MyChart username and password.  Masks are optional in the cancer centers. If you would like for your care team to wear a mask while they are taking care of you, please let them know. You may have one support person who is at least 75 years old accompany you for your appointments.

## 2022-03-11 NOTE — Progress Notes (Signed)
Pt here for patient teaching.  Pt given Radiation and You booklet, skin care instructions, and Sonafine.  Reviewed areas of pertinence such as fatigue, hair loss, skin changes, and throat changes . Pt able to give teach back of drink plenty of water,apply Sonafine bid and avoid applying anything to skin within 4 hours of treatment. Pt verbalizes understanding of information given and will contact nursing with any questions or concerns.     [email or web address]

## 2022-03-12 ENCOUNTER — Other Ambulatory Visit: Payer: Self-pay

## 2022-03-12 ENCOUNTER — Ambulatory Visit
Admission: RE | Admit: 2022-03-12 | Discharge: 2022-03-12 | Disposition: A | Discharge status: Home / Self Care | Payer: Medicare Other | Source: Ambulatory Visit | Attending: Radiation Oncology | Admitting: Radiation Oncology

## 2022-03-12 DIAGNOSIS — Z79899 Other long term (current) drug therapy: Secondary | ICD-10-CM | POA: Diagnosis not present

## 2022-03-12 DIAGNOSIS — Z87891 Personal history of nicotine dependence: Secondary | ICD-10-CM | POA: Diagnosis not present

## 2022-03-12 DIAGNOSIS — C3412 Malignant neoplasm of upper lobe, left bronchus or lung: Secondary | ICD-10-CM | POA: Diagnosis not present

## 2022-03-12 DIAGNOSIS — C778 Secondary and unspecified malignant neoplasm of lymph nodes of multiple regions: Secondary | ICD-10-CM | POA: Diagnosis not present

## 2022-03-12 DIAGNOSIS — M47812 Spondylosis without myelopathy or radiculopathy, cervical region: Secondary | ICD-10-CM | POA: Diagnosis not present

## 2022-03-12 DIAGNOSIS — Z5111 Encounter for antineoplastic chemotherapy: Secondary | ICD-10-CM | POA: Diagnosis not present

## 2022-03-12 DIAGNOSIS — Z510 Encounter for antineoplastic radiation therapy: Secondary | ICD-10-CM | POA: Diagnosis not present

## 2022-03-12 LAB — RAD ONC ARIA SESSION SUMMARY
Course Elapsed Days: 7
Plan Fractions Treated to Date: 6
Plan Prescribed Dose Per Fraction: 2 Gy
Plan Total Fractions Prescribed: 33
Plan Total Prescribed Dose: 66 Gy
Reference Point Dosage Given to Date: 12 Gy
Reference Point Session Dosage Given: 2 Gy
Session Number: 6

## 2022-03-13 ENCOUNTER — Ambulatory Visit
Admission: RE | Admit: 2022-03-13 | Discharge: 2022-03-13 | Disposition: A | Discharge status: Home / Self Care | Payer: Medicare Other | Source: Ambulatory Visit | Attending: Radiation Oncology | Admitting: Radiation Oncology

## 2022-03-13 ENCOUNTER — Other Ambulatory Visit: Payer: Self-pay

## 2022-03-13 DIAGNOSIS — C3412 Malignant neoplasm of upper lobe, left bronchus or lung: Secondary | ICD-10-CM | POA: Diagnosis not present

## 2022-03-13 DIAGNOSIS — M47812 Spondylosis without myelopathy or radiculopathy, cervical region: Secondary | ICD-10-CM | POA: Diagnosis not present

## 2022-03-13 DIAGNOSIS — C778 Secondary and unspecified malignant neoplasm of lymph nodes of multiple regions: Secondary | ICD-10-CM | POA: Diagnosis not present

## 2022-03-13 DIAGNOSIS — Z87891 Personal history of nicotine dependence: Secondary | ICD-10-CM | POA: Diagnosis not present

## 2022-03-13 DIAGNOSIS — Z5111 Encounter for antineoplastic chemotherapy: Secondary | ICD-10-CM | POA: Diagnosis not present

## 2022-03-13 DIAGNOSIS — Z79899 Other long term (current) drug therapy: Secondary | ICD-10-CM | POA: Diagnosis not present

## 2022-03-13 DIAGNOSIS — Z510 Encounter for antineoplastic radiation therapy: Secondary | ICD-10-CM | POA: Diagnosis not present

## 2022-03-13 LAB — RAD ONC ARIA SESSION SUMMARY
Course Elapsed Days: 8
Plan Fractions Treated to Date: 7
Plan Prescribed Dose Per Fraction: 2 Gy
Plan Total Fractions Prescribed: 33
Plan Total Prescribed Dose: 66 Gy
Reference Point Dosage Given to Date: 14 Gy
Reference Point Session Dosage Given: 2 Gy
Session Number: 7

## 2022-03-14 ENCOUNTER — Other Ambulatory Visit: Payer: Self-pay

## 2022-03-14 ENCOUNTER — Ambulatory Visit
Admission: RE | Admit: 2022-03-14 | Discharge: 2022-03-14 | Disposition: A | Discharge status: Home / Self Care | Payer: Medicare Other | Source: Ambulatory Visit | Attending: Radiation Oncology | Admitting: Radiation Oncology

## 2022-03-14 DIAGNOSIS — Z79899 Other long term (current) drug therapy: Secondary | ICD-10-CM | POA: Diagnosis not present

## 2022-03-14 DIAGNOSIS — Z510 Encounter for antineoplastic radiation therapy: Secondary | ICD-10-CM | POA: Diagnosis not present

## 2022-03-14 DIAGNOSIS — C778 Secondary and unspecified malignant neoplasm of lymph nodes of multiple regions: Secondary | ICD-10-CM | POA: Diagnosis not present

## 2022-03-14 DIAGNOSIS — C3412 Malignant neoplasm of upper lobe, left bronchus or lung: Secondary | ICD-10-CM | POA: Diagnosis not present

## 2022-03-14 DIAGNOSIS — Z5111 Encounter for antineoplastic chemotherapy: Secondary | ICD-10-CM | POA: Diagnosis not present

## 2022-03-14 DIAGNOSIS — M47812 Spondylosis without myelopathy or radiculopathy, cervical region: Secondary | ICD-10-CM | POA: Diagnosis not present

## 2022-03-14 DIAGNOSIS — Z87891 Personal history of nicotine dependence: Secondary | ICD-10-CM | POA: Diagnosis not present

## 2022-03-14 LAB — RAD ONC ARIA SESSION SUMMARY
Course Elapsed Days: 9
Plan Fractions Treated to Date: 8
Plan Prescribed Dose Per Fraction: 2 Gy
Plan Total Fractions Prescribed: 33
Plan Total Prescribed Dose: 66 Gy
Reference Point Dosage Given to Date: 16 Gy
Reference Point Session Dosage Given: 2 Gy
Session Number: 8

## 2022-03-15 ENCOUNTER — Ambulatory Visit
Admission: RE | Admit: 2022-03-15 | Discharge: 2022-03-15 | Disposition: A | Discharge status: Home / Self Care | Payer: Medicare Other | Source: Ambulatory Visit | Attending: Radiation Oncology | Admitting: Radiation Oncology

## 2022-03-15 ENCOUNTER — Other Ambulatory Visit: Payer: Self-pay

## 2022-03-15 DIAGNOSIS — Z79899 Other long term (current) drug therapy: Secondary | ICD-10-CM | POA: Diagnosis not present

## 2022-03-15 DIAGNOSIS — Z510 Encounter for antineoplastic radiation therapy: Secondary | ICD-10-CM | POA: Diagnosis not present

## 2022-03-15 DIAGNOSIS — M47812 Spondylosis without myelopathy or radiculopathy, cervical region: Secondary | ICD-10-CM | POA: Diagnosis not present

## 2022-03-15 DIAGNOSIS — Z87891 Personal history of nicotine dependence: Secondary | ICD-10-CM | POA: Diagnosis not present

## 2022-03-15 DIAGNOSIS — C778 Secondary and unspecified malignant neoplasm of lymph nodes of multiple regions: Secondary | ICD-10-CM | POA: Diagnosis not present

## 2022-03-15 DIAGNOSIS — C3412 Malignant neoplasm of upper lobe, left bronchus or lung: Secondary | ICD-10-CM | POA: Diagnosis not present

## 2022-03-15 DIAGNOSIS — Z5111 Encounter for antineoplastic chemotherapy: Secondary | ICD-10-CM | POA: Diagnosis not present

## 2022-03-15 LAB — RAD ONC ARIA SESSION SUMMARY
Course Elapsed Days: 10
Plan Fractions Treated to Date: 9
Plan Prescribed Dose Per Fraction: 2 Gy
Plan Total Fractions Prescribed: 33
Plan Total Prescribed Dose: 66 Gy
Reference Point Dosage Given to Date: 18 Gy
Reference Point Session Dosage Given: 2 Gy
Session Number: 9

## 2022-03-15 MED FILL — Dexamethasone Sodium Phosphate Inj 100 MG/10ML: INTRAMUSCULAR | Qty: 1 | Status: AC

## 2022-03-18 ENCOUNTER — Inpatient Hospital Stay: Payer: Medicare Other

## 2022-03-18 ENCOUNTER — Ambulatory Visit: Payer: Medicare Other

## 2022-03-18 ENCOUNTER — Other Ambulatory Visit: Payer: Self-pay

## 2022-03-18 ENCOUNTER — Ambulatory Visit
Admission: RE | Admit: 2022-03-18 | Discharge: 2022-03-18 | Disposition: A | Discharge status: Home / Self Care | Payer: Medicare Other | Source: Ambulatory Visit | Attending: Radiation Oncology | Admitting: Radiation Oncology

## 2022-03-18 VITALS — BP 118/79 | HR 69 | Temp 98.3°F | Resp 16 | Ht 64.0 in | Wt 174.8 lb

## 2022-03-18 DIAGNOSIS — Z5111 Encounter for antineoplastic chemotherapy: Secondary | ICD-10-CM | POA: Diagnosis not present

## 2022-03-18 DIAGNOSIS — C3492 Malignant neoplasm of unspecified part of left bronchus or lung: Secondary | ICD-10-CM

## 2022-03-18 DIAGNOSIS — Z87891 Personal history of nicotine dependence: Secondary | ICD-10-CM | POA: Diagnosis not present

## 2022-03-18 DIAGNOSIS — C778 Secondary and unspecified malignant neoplasm of lymph nodes of multiple regions: Secondary | ICD-10-CM | POA: Diagnosis not present

## 2022-03-18 DIAGNOSIS — M47812 Spondylosis without myelopathy or radiculopathy, cervical region: Secondary | ICD-10-CM | POA: Diagnosis not present

## 2022-03-18 DIAGNOSIS — Z510 Encounter for antineoplastic radiation therapy: Secondary | ICD-10-CM | POA: Diagnosis not present

## 2022-03-18 DIAGNOSIS — C3412 Malignant neoplasm of upper lobe, left bronchus or lung: Secondary | ICD-10-CM | POA: Diagnosis not present

## 2022-03-18 DIAGNOSIS — Z79899 Other long term (current) drug therapy: Secondary | ICD-10-CM | POA: Diagnosis not present

## 2022-03-18 LAB — RAD ONC ARIA SESSION SUMMARY
Course Elapsed Days: 13
Plan Fractions Treated to Date: 10
Plan Prescribed Dose Per Fraction: 2 Gy
Plan Total Fractions Prescribed: 33
Plan Total Prescribed Dose: 66 Gy
Reference Point Dosage Given to Date: 20 Gy
Reference Point Session Dosage Given: 2 Gy
Session Number: 10

## 2022-03-18 LAB — CBC WITH DIFFERENTIAL (CANCER CENTER ONLY)
Abs Immature Granulocytes: 0.01 10*3/uL (ref 0.00–0.07)
Basophils Absolute: 0 10*3/uL (ref 0.0–0.1)
Basophils Relative: 1 %
Eosinophils Absolute: 0.1 10*3/uL (ref 0.0–0.5)
Eosinophils Relative: 5 %
HCT: 32.7 % — ABNORMAL LOW (ref 36.0–46.0)
Hemoglobin: 10.6 g/dL — ABNORMAL LOW (ref 12.0–15.0)
Immature Granulocytes: 0 %
Lymphocytes Relative: 26 %
Lymphs Abs: 0.7 10*3/uL (ref 0.7–4.0)
MCH: 28.1 pg (ref 26.0–34.0)
MCHC: 32.4 g/dL (ref 30.0–36.0)
MCV: 86.7 fL (ref 80.0–100.0)
Monocytes Absolute: 0.2 10*3/uL (ref 0.1–1.0)
Monocytes Relative: 8 %
Neutro Abs: 1.7 10*3/uL (ref 1.7–7.7)
Neutrophils Relative %: 60 %
Platelet Count: 236 10*3/uL (ref 150–400)
RBC: 3.77 MIL/uL — ABNORMAL LOW (ref 3.87–5.11)
RDW: 13.5 % (ref 11.5–15.5)
WBC Count: 2.9 10*3/uL — ABNORMAL LOW (ref 4.0–10.5)
nRBC: 0 % (ref 0.0–0.2)

## 2022-03-18 LAB — CMP (CANCER CENTER ONLY)
ALT: 12 U/L (ref 0–44)
AST: 13 U/L — ABNORMAL LOW (ref 15–41)
Albumin: 4 g/dL (ref 3.5–5.0)
Alkaline Phosphatase: 61 U/L (ref 38–126)
Anion gap: 7 (ref 5–15)
BUN: 28 mg/dL — ABNORMAL HIGH (ref 8–23)
CO2: 23 mmol/L (ref 22–32)
Calcium: 9.4 mg/dL (ref 8.9–10.3)
Chloride: 106 mmol/L (ref 98–111)
Creatinine: 1.14 mg/dL — ABNORMAL HIGH (ref 0.44–1.00)
GFR, Estimated: 50 mL/min — ABNORMAL LOW (ref >60)
Glucose, Bld: 110 mg/dL — ABNORMAL HIGH (ref 70–99)
Potassium: 4.5 mmol/L (ref 3.5–5.1)
Sodium: 136 mmol/L (ref 135–145)
Total Bilirubin: 0.2 mg/dL — ABNORMAL LOW (ref 0.3–1.2)
Total Protein: 7.3 g/dL (ref 6.5–8.1)

## 2022-03-18 MED ORDER — SODIUM CHLORIDE 0.9 % IV SOLN: Freq: Once | INTRAVENOUS | Status: AC

## 2022-03-18 MED ORDER — FAMOTIDINE IN NACL 20-0.9 MG/50ML-% IV SOLN
20.0000 mg | Freq: Once | INTRAVENOUS | Status: AC
  Administered 2022-03-18: 20 mg via INTRAVENOUS
  Filled 2022-03-18: qty 50

## 2022-03-18 MED ORDER — PALONOSETRON HCL INJECTION 0.25 MG/5ML
0.2500 mg | Freq: Once | INTRAVENOUS | Status: AC
  Administered 2022-03-18: 0.25 mg via INTRAVENOUS
  Filled 2022-03-18: qty 5

## 2022-03-18 MED ORDER — SODIUM CHLORIDE 0.9 % IV SOLN
10.0000 mg | Freq: Once | INTRAVENOUS | Status: AC
  Administered 2022-03-18: 10 mg via INTRAVENOUS
  Filled 2022-03-18: qty 10

## 2022-03-18 MED ORDER — SODIUM CHLORIDE 0.9 % IV SOLN
45.0000 mg/m2 | Freq: Once | INTRAVENOUS | Status: AC
  Administered 2022-03-18: 84 mg via INTRAVENOUS
  Filled 2022-03-18: qty 14

## 2022-03-18 MED ORDER — SODIUM CHLORIDE 0.9 % IV SOLN
147.2000 mg | Freq: Once | INTRAVENOUS | Status: AC
  Administered 2022-03-18: 150 mg via INTRAVENOUS
  Filled 2022-03-18: qty 15

## 2022-03-18 MED ORDER — CETIRIZINE HCL 10 MG/ML IV SOLN
10.0000 mg | Freq: Once | INTRAVENOUS | Status: AC
  Administered 2022-03-18: 10 mg via INTRAVENOUS
  Filled 2022-03-18: qty 1

## 2022-03-18 NOTE — Patient Instructions (Signed)
[facility] ONCOLOGY  Discharge Instructions: Thank you for choosing [facility] to provide your oncology and hematology care.   If you have a lab appointment with the [facility], please go directly to the [facility] and check in at the registration area.   Wear comfortable clothing and clothing appropriate for easy access to any Portacath or PICC line.   We strive to give you quality time with your provider. You may need to reschedule your appointment if you arrive late (15 or more minutes).  Arriving late affects you and other patients whose appointments are after yours.  Also, if you miss three or more appointments without notifying the office, you may be dismissed from the clinic at the provider's discretion.      For prescription refill requests, have your pharmacy contact our office and allow 72 hours for refills to be completed.    Today you received the following chemotherapy and/or immunotherapy agents: paclitaxel, carboplatin      To help prevent nausea and vomiting after your treatment, we encourage you to take your nausea medication as directed.  BELOW ARE SYMPTOMS THAT SHOULD BE REPORTED IMMEDIATELY: *FEVER GREATER THAN 100.4 F (38 C) OR HIGHER *CHILLS OR SWEATING *NAUSEA AND VOMITING THAT IS NOT CONTROLLED WITH YOUR NAUSEA MEDICATION *UNUSUAL SHORTNESS OF BREATH *UNUSUAL BRUISING OR BLEEDING *URINARY PROBLEMS (pain or burning when urinating, or frequent urination) *BOWEL PROBLEMS (unusual diarrhea, constipation, pain near the anus) TENDERNESS IN MOUTH AND THROAT WITH OR WITHOUT PRESENCE OF ULCERS (sore throat, sores in mouth, or a toothache) UNUSUAL RASH, SWELLING OR PAIN  UNUSUAL VAGINAL DISCHARGE OR ITCHING   Items with * indicate a potential emergency and should be followed up as soon as possible or go to the Emergency Department if any problems should occur.  Please show the CHEMOTHERAPY ALERT CARD or IMMUNOTHERAPY ALERT CARD  at check-in to the Emergency Department and triage nurse.  Should you have questions after your visit or need to cancel or reschedule your appointment, please contact [facility]  Dept: [phone]  and follow the prompts.  Office hours are 8:00 a.m. to 4:30 p.m. Monday - Friday. Please note that voicemails left after 4:00 p.m. may not be returned until the following business day.  We are closed weekends and major holidays. You have access to a nurse at all times for urgent questions. Please call the main number to the clinic Dept: [phone] and follow the prompts.   For any non-urgent questions, you may also contact your provider using MyChart. We now offer e-Visits for anyone 68 and older to request care online for non-urgent symptoms. For details visit [email or web address].   Also download the MyChart app! Go to the app store, search "MyChart", open the app, select [facility], and log in with your MyChart username and password.  Masks are optional in the cancer centers. If you would like for your care team to wear a mask while they are taking care of you, please let them know. You may have one support person who is at least 75 years old accompany you for your appointments.

## 2022-03-19 ENCOUNTER — Other Ambulatory Visit: Payer: Self-pay

## 2022-03-19 ENCOUNTER — Ambulatory Visit
Admission: RE | Admit: 2022-03-19 | Discharge: 2022-03-19 | Disposition: A | Discharge status: Home / Self Care | Payer: Medicare Other | Source: Ambulatory Visit | Attending: Radiation Oncology | Admitting: Radiation Oncology

## 2022-03-19 DIAGNOSIS — I10 Essential (primary) hypertension: Secondary | ICD-10-CM | POA: Diagnosis not present

## 2022-03-19 DIAGNOSIS — C3412 Malignant neoplasm of upper lobe, left bronchus or lung: Secondary | ICD-10-CM | POA: Diagnosis not present

## 2022-03-19 DIAGNOSIS — Z79899 Other long term (current) drug therapy: Secondary | ICD-10-CM | POA: Diagnosis not present

## 2022-03-19 DIAGNOSIS — Z87891 Personal history of nicotine dependence: Secondary | ICD-10-CM | POA: Diagnosis not present

## 2022-03-19 DIAGNOSIS — Z510 Encounter for antineoplastic radiation therapy: Secondary | ICD-10-CM | POA: Diagnosis not present

## 2022-03-19 DIAGNOSIS — C3490 Malignant neoplasm of unspecified part of unspecified bronchus or lung: Secondary | ICD-10-CM | POA: Diagnosis not present

## 2022-03-19 DIAGNOSIS — M47812 Spondylosis without myelopathy or radiculopathy, cervical region: Secondary | ICD-10-CM | POA: Diagnosis not present

## 2022-03-19 DIAGNOSIS — Z5111 Encounter for antineoplastic chemotherapy: Secondary | ICD-10-CM | POA: Diagnosis not present

## 2022-03-19 DIAGNOSIS — E1169 Type 2 diabetes mellitus with other specified complication: Secondary | ICD-10-CM | POA: Diagnosis not present

## 2022-03-19 DIAGNOSIS — C778 Secondary and unspecified malignant neoplasm of lymph nodes of multiple regions: Secondary | ICD-10-CM | POA: Diagnosis not present

## 2022-03-19 LAB — RAD ONC ARIA SESSION SUMMARY
Course Elapsed Days: 14
Plan Fractions Treated to Date: 11
Plan Prescribed Dose Per Fraction: 2 Gy
Plan Total Fractions Prescribed: 33
Plan Total Prescribed Dose: 66 Gy
Reference Point Dosage Given to Date: 22 Gy
Reference Point Session Dosage Given: 2 Gy
Session Number: 11

## 2022-03-20 ENCOUNTER — Ambulatory Visit
Admission: RE | Admit: 2022-03-20 | Discharge: 2022-03-20 | Disposition: A | Discharge status: Home / Self Care | Payer: Medicare Other | Source: Ambulatory Visit | Attending: Radiation Oncology | Admitting: Radiation Oncology

## 2022-03-20 ENCOUNTER — Other Ambulatory Visit: Payer: Self-pay

## 2022-03-20 DIAGNOSIS — Z5111 Encounter for antineoplastic chemotherapy: Secondary | ICD-10-CM | POA: Diagnosis not present

## 2022-03-20 DIAGNOSIS — C778 Secondary and unspecified malignant neoplasm of lymph nodes of multiple regions: Secondary | ICD-10-CM | POA: Diagnosis not present

## 2022-03-20 DIAGNOSIS — Z510 Encounter for antineoplastic radiation therapy: Secondary | ICD-10-CM | POA: Diagnosis not present

## 2022-03-20 DIAGNOSIS — Z79899 Other long term (current) drug therapy: Secondary | ICD-10-CM | POA: Diagnosis not present

## 2022-03-20 DIAGNOSIS — Z87891 Personal history of nicotine dependence: Secondary | ICD-10-CM | POA: Diagnosis not present

## 2022-03-20 DIAGNOSIS — M47812 Spondylosis without myelopathy or radiculopathy, cervical region: Secondary | ICD-10-CM | POA: Diagnosis not present

## 2022-03-20 DIAGNOSIS — C3412 Malignant neoplasm of upper lobe, left bronchus or lung: Secondary | ICD-10-CM | POA: Diagnosis not present

## 2022-03-20 LAB — RAD ONC ARIA SESSION SUMMARY
Course Elapsed Days: 15
Plan Fractions Treated to Date: 12
Plan Prescribed Dose Per Fraction: 2 Gy
Plan Total Fractions Prescribed: 33
Plan Total Prescribed Dose: 66 Gy
Reference Point Dosage Given to Date: 24 Gy
Reference Point Session Dosage Given: 2 Gy
Session Number: 12

## 2022-03-21 ENCOUNTER — Other Ambulatory Visit: Payer: Self-pay

## 2022-03-21 ENCOUNTER — Ambulatory Visit
Admission: RE | Admit: 2022-03-21 | Discharge: 2022-03-21 | Disposition: A | Discharge status: Home / Self Care | Payer: Medicare Other | Source: Ambulatory Visit | Attending: Radiation Oncology | Admitting: Radiation Oncology

## 2022-03-21 DIAGNOSIS — Z79899 Other long term (current) drug therapy: Secondary | ICD-10-CM | POA: Diagnosis not present

## 2022-03-21 DIAGNOSIS — C3412 Malignant neoplasm of upper lobe, left bronchus or lung: Secondary | ICD-10-CM | POA: Diagnosis not present

## 2022-03-21 DIAGNOSIS — Z87891 Personal history of nicotine dependence: Secondary | ICD-10-CM | POA: Diagnosis not present

## 2022-03-21 DIAGNOSIS — Z5111 Encounter for antineoplastic chemotherapy: Secondary | ICD-10-CM | POA: Diagnosis not present

## 2022-03-21 DIAGNOSIS — M47812 Spondylosis without myelopathy or radiculopathy, cervical region: Secondary | ICD-10-CM | POA: Diagnosis not present

## 2022-03-21 DIAGNOSIS — Z510 Encounter for antineoplastic radiation therapy: Secondary | ICD-10-CM | POA: Diagnosis not present

## 2022-03-21 DIAGNOSIS — C778 Secondary and unspecified malignant neoplasm of lymph nodes of multiple regions: Secondary | ICD-10-CM | POA: Diagnosis not present

## 2022-03-21 LAB — RAD ONC ARIA SESSION SUMMARY
Course Elapsed Days: 16
Plan Fractions Treated to Date: 13
Plan Prescribed Dose Per Fraction: 2 Gy
Plan Total Fractions Prescribed: 33
Plan Total Prescribed Dose: 66 Gy
Reference Point Dosage Given to Date: 26 Gy
Reference Point Session Dosage Given: 2 Gy
Session Number: 13

## 2022-03-22 ENCOUNTER — Other Ambulatory Visit: Payer: Self-pay

## 2022-03-22 ENCOUNTER — Ambulatory Visit
Admission: RE | Admit: 2022-03-22 | Discharge: 2022-03-22 | Disposition: A | Discharge status: Home / Self Care | Payer: Medicare Other | Source: Ambulatory Visit | Attending: Radiation Oncology | Admitting: Radiation Oncology

## 2022-03-22 DIAGNOSIS — Z79899 Other long term (current) drug therapy: Secondary | ICD-10-CM | POA: Diagnosis not present

## 2022-03-22 DIAGNOSIS — Z510 Encounter for antineoplastic radiation therapy: Secondary | ICD-10-CM | POA: Diagnosis not present

## 2022-03-22 DIAGNOSIS — M47812 Spondylosis without myelopathy or radiculopathy, cervical region: Secondary | ICD-10-CM | POA: Diagnosis not present

## 2022-03-22 DIAGNOSIS — Z87891 Personal history of nicotine dependence: Secondary | ICD-10-CM | POA: Diagnosis not present

## 2022-03-22 DIAGNOSIS — C3412 Malignant neoplasm of upper lobe, left bronchus or lung: Secondary | ICD-10-CM | POA: Diagnosis not present

## 2022-03-22 DIAGNOSIS — Z5111 Encounter for antineoplastic chemotherapy: Secondary | ICD-10-CM | POA: Diagnosis not present

## 2022-03-22 DIAGNOSIS — C778 Secondary and unspecified malignant neoplasm of lymph nodes of multiple regions: Secondary | ICD-10-CM | POA: Diagnosis not present

## 2022-03-22 LAB — RAD ONC ARIA SESSION SUMMARY
Course Elapsed Days: 17
Plan Fractions Treated to Date: 14
Plan Prescribed Dose Per Fraction: 2 Gy
Plan Total Fractions Prescribed: 33
Plan Total Prescribed Dose: 66 Gy
Reference Point Dosage Given to Date: 28 Gy
Reference Point Session Dosage Given: 2 Gy
Session Number: 14

## 2022-03-22 MED FILL — Dexamethasone Sodium Phosphate Inj 100 MG/10ML: INTRAMUSCULAR | Qty: 1 | Status: AC

## 2022-03-22 NOTE — Progress Notes (Unsigned)
[facility] OFFICE PROGRESS NOTE  [name], [address] [id number]  DIAGNOSIS: Stage IIIb (T1a, N3, M0) non-small cell lung cancer, adenosquamous carcinoma presented with left upper lobe pulmonary nodule in addition to left hilar, mediastinal and left supraclavicular and cervical lymphadenopathy diagnosed in September 2023.   Detected Alteration(s) / Biomarker(s)          Associated FDA-approved therapies  Clinical Trial Availability          % cfDNA or Amplification   TP53 [id number] None Yes         1.6%   NFE2L2 D29V None Yes        1.2%   TP53 [id number] None Yes          0.4%   TMB 18.02 mut/Mb   PDL1 Expression: 80%  PRIOR THERAPY: None  CURRENT THERAPY: A course of concurrent chemoradiation with weekly carboplatin for AUC of 2 and paclitaxel 45 Mg/M2.  First dose March 11, 2022. Status post 3 cycles.   INTERVAL HISTORY: Yolanda Robertson 76 y.o. female returns to the clinic today for a follow-up visit accompanied by her husband.  The patient is currently undergoing treatment concurrent chemoradiation with carboplatin for an AUC of 2 and paclitaxel 45 mg per metered square.  The patient is status post 3 cycles of treatment and she is tolerating it well without any concerning adverse side effects.  Her last day radiation is tentatively scheduled for 04/22/2022.  Today, the patient denies any new concerning complaints.  Denies any fever, chills, night sweats, or unexplained weight loss.  Denies any chest pain, shortness of breath, cough, or hemoptysis. She states she used to have a cough when she first started treatment but it went away. Denies any nausea, vomiting, diarrhea, or constipation. Denies any headache or visual changes. She mentions the left cervical lymph node has been getting smaller. She is here today for evaluation and repeat blood work before undergoing cycle #4.    MEDICAL HISTORY: Past Medical History:  Diagnosis Date    Hyperlipidemia    Hypertension    Lung mass    left upper lobe    ALLERGIES:  has No Known Allergies.  MEDICATIONS:  Current Outpatient Medications  Medication Sig Dispense Refill   benzonatate (TESSALON) 200 MG capsule Take 1 capsule (200 mg total) by mouth 3 (three) times daily as needed for cough. 30 capsule 1   ibuprofen (ADVIL) 200 MG tablet Take 200 mg by mouth every 8 (eight) hours as needed (pain.).     indapamide (LOZOL) 1.25 MG tablet Take 1.25 mg by mouth in the morning.     Polyvinyl Alcohol-Povidone PF (REFRESH) 1.4-0.6 % SOLN Place 1-2 drops into both eyes 3 (three) times daily as needed (dry/irritated eyes.).     prochlorperazine (COMPAZINE) 10 MG tablet Take 1 tablet (10 mg total) by mouth every 6 (six) hours as needed for nausea or vomiting. 30 tablet 0   rosuvastatin (CRESTOR) 10 MG tablet Take 10 mg by mouth in the morning.     No current facility-administered medications for this visit.    SURGICAL HISTORY:  Past Surgical History:  Procedure Laterality Date   FINE NEEDLE ASPIRATION  02/12/2022   Procedure: FINE NEEDLE ASPIRATION (FNA) LINEAR;  Surgeon: Garner Nash, DO;  Location: [facility];  Service: Pulmonary;;   VAGINAL HYSTERECTOMY     VIDEO BRONCHOSCOPY WITH ENDOBRONCHIAL ULTRASOUND Left 02/12/2022   Procedure: VIDEO BRONCHOSCOPY WITH ENDOBRONCHIAL ULTRASOUND;  Surgeon: Garner Nash, DO;  Location: [facility];  Service: Pulmonary;  Laterality: Left;    REVIEW OF SYSTEMS:   Review of Systems  Constitutional: Negative for appetite change, chills, fatigue, fever and unexpected weight change.  HENT: Negative for mouth sores, nosebleeds, sore throat and trouble swallowing.   Eyes: Negative for eye problems and icterus.  Respiratory: Negative for cough, hemoptysis, shortness of breath and wheezing.   Cardiovascular: Negative for chest pain and leg swelling.  Gastrointestinal: Negative for abdominal pain, constipation, diarrhea, nausea and vomiting.   Genitourinary: Negative for bladder incontinence, difficulty urinating, dysuria, frequency and hematuria.   Musculoskeletal: Negative for back pain, gait problem, neck pain and neck stiffness.  Skin: Negative for itching and rash.  Neurological: Negative for dizziness, extremity weakness, gait problem, headaches, light-headedness and seizures.  Hematological: Negative for adenopathy. Does not bruise/bleed easily.  Psychiatric/Behavioral: Negative for confusion, depression and sleep disturbance. The patient is not nervous/anxious.     PHYSICAL EXAMINATION:  There were no vitals taken for this visit.  ECOG PERFORMANCE STATUS: 1  Physical Exam  Constitutional: Oriented to person, place, and time and well-developed, well-nourished, and in no distress. No distress.  HENT:  Head: Normocephalic and atraumatic.  Mouth/Throat: Oropharynx is clear and moist. No oropharyngeal exudate.  Eyes: Conjunctivae are normal. Right eye exhibits no discharge. Left eye exhibits no discharge. No scleral icterus.  Neck: Normal range of motion. Neck supple.  Cardiovascular: Normal rate, regular rhythm, normal heart sounds and intact distal pulses.   Pulmonary/Chest: Effort normal and breath sounds normal. No respiratory distress. No wheezes. No rales.  Abdominal: Soft. Bowel sounds are normal. Exhibits no distension and no mass. There is no tenderness.  Musculoskeletal: Normal range of motion. Exhibits no edema.  Lymphadenopathy:    Positive for left cervical adenopathy.  Neurological: Alert and oriented to person, place, and time. Exhibits normal muscle tone. Gait normal. Coordination normal.  Skin: Skin is warm and dry. No rash noted. Not diaphoretic. No erythema. No pallor.  Psychiatric: Mood, memory and judgment normal.  Vitals reviewed.  LABORATORY DATA: Lab Results  Component Value Date   WBC 2.9 (L) 03/18/2022   HGB 10.6 (L) 03/18/2022   HCT 32.7 (L) 03/18/2022   MCV 86.7 03/18/2022   PLT 236  03/18/2022      Chemistry      Component Value Date/Time   NA 136 03/18/2022 1006   K 4.5 03/18/2022 1006   CL 106 03/18/2022 1006   CO2 23 03/18/2022 1006   BUN 28 (H) 03/18/2022 1006   CREATININE 1.14 (H) 03/18/2022 1006      Component Value Date/Time   CALCIUM 9.4 03/18/2022 1006   ALKPHOS 61 03/18/2022 1006   AST 13 (L) 03/18/2022 1006   ALT 12 03/18/2022 1006   BILITOT 0.2 (L) 03/18/2022 1006       RADIOGRAPHIC STUDIES:  [place] CORE BIOPSY (SALIVARY GLAND/PAROTID GLAND)  Result Date: 02/20/2022 INDICATION: Concern for lymphoproliferative disorder. Please perform ultrasound-guided biopsy of left cervical lymph node for tissue diagnostic purposes. EXAM: ULTRASOUND-GUIDED LEFT CERVICAL LYMPH NODE BIOPSY COMPARISON:  PET-CT-02/01/2022 MEDICATIONS: None ANESTHESIA/SEDATION: Moderate (conscious) sedation was employed during this procedure. A total of Versed 2 mg and Fentanyl 100 mcg was administered intravenously. Moderate Sedation Time: 10 minutes. The patient's level of consciousness and vital signs were monitored continuously by radiology nursing throughout the procedure under my direct supervision. COMPLICATIONS: None immediate. TECHNIQUE: Informed written consent was obtained from the patient after a discussion of the risks, benefits  and alternatives to treatment. Questions regarding the procedure were encouraged and answered. Initial ultrasound scanning demonstrated an approximately 2.6 x 1.9 cm malignant appearing high left cervical lymph node (image 4), correlating with the hypermetabolic lymph node seen on preceding PET-CT image 24, series 603. An ultrasound image was saved for documentation purposes. The procedure was planned. A timeout was performed prior to the initiation of the procedure. The operative was prepped and draped in the usual sterile fashion, and a sterile drape was applied covering the operative field. A timeout was performed prior to the initiation of the procedure.  Local anesthesia was provided with 1% lidocaine with epinephrine. Under direct ultrasound guidance, an 18 gauge core needle device was utilized to obtain to obtain 6 core needle biopsies of the hypermetabolic left cervical lymph node. The samples were placed in saline and submitted to pathology. The needle was removed and hemostasis was achieved with manual compression. Post procedure scan was negative for significant hematoma. A dressing was applied. The patient tolerated the procedure well without immediate postprocedural complication. IMPRESSION: Technically successful ultrasound guided biopsy of dominant hypermetabolic left cervical lymph node. Electronically Signed   By: Sandi Mariscal M.D.   On: 02/20/2022 12:34     ASSESSMENT/PLAN:  This is a very pleasant 76 year old African-American female with stage IIIb (T1a, N3, M0) non-small cell lung cancer, adenosquamous carcinoma.  The patient presented with a left upper lobe pulmonary nodule in addition to left hilar, mediastinal, left supraclavicular and cervical lymphadenopathy.  She was diagnosed in September 2023.  The patient does not have any actual mutations.  Her PD-L1 expression is 80%.  The patient is currently undergoing a course of concurrent chemoradiation with carboplatin for an AUC of 2 paclitaxel 45 mg per metered square.  The patient's first dose of treatment was on 03/11/2022.  She status post 3 cycles.  She is tolerating it well without any concerning adverse side effects.   Labs were reviewed. Her total WBC is 2.9 and ANC 1.5.  Recommend that she proceed with cycle #4 today scheduled.  Her last day radiation is tentatively scheduled for 04/22/2022. Therefore, her last chemotherapy will likely be on 04/15/22 if labs permit. I will review when we expect her next staging CT scan when I see her at her next appointment on 04/08/22.   We will see her back for follow-up visit in 2 weeks for evaluation and repeat blood work before undergoing  cycle #6.  The patient was advised to call immediately if she has any concerning symptoms in the interval. The patient voices understanding of current disease status and treatment options and is in agreement with the current care plan. All questions were answered. The patient knows to call the clinic with any problems, questions or concerns. We can certainly see the patient much sooner if necessary       No orders of the defined types were placed in this encounter.     The total time spent in the appointment was 20-29 minutes.   Kianni Lheureux L Lavere Shinsky, PA-C 03/22/22

## 2022-03-25 ENCOUNTER — Other Ambulatory Visit: Payer: Self-pay

## 2022-03-25 ENCOUNTER — Ambulatory Visit
Admission: RE | Admit: 2022-03-25 | Discharge: 2022-03-25 | Disposition: A | Discharge status: Home / Self Care | Payer: Medicare Other | Source: Ambulatory Visit | Attending: Radiation Oncology | Admitting: Radiation Oncology

## 2022-03-25 ENCOUNTER — Inpatient Hospital Stay (HOSPITAL_BASED_OUTPATIENT_CLINIC_OR_DEPARTMENT_OTHER): Payer: Medicare Other | Admitting: Physician Assistant

## 2022-03-25 ENCOUNTER — Ambulatory Visit: Payer: Medicare Other

## 2022-03-25 ENCOUNTER — Inpatient Hospital Stay: Payer: Medicare Other

## 2022-03-25 VITALS — BP 128/86 | HR 72 | Temp 97.9°F | Resp 18

## 2022-03-25 VITALS — BP 114/79 | HR 73 | Temp 97.5°F | Resp 15 | Wt 174.6 lb

## 2022-03-25 DIAGNOSIS — C778 Secondary and unspecified malignant neoplasm of lymph nodes of multiple regions: Secondary | ICD-10-CM | POA: Diagnosis not present

## 2022-03-25 DIAGNOSIS — C3412 Malignant neoplasm of upper lobe, left bronchus or lung: Secondary | ICD-10-CM | POA: Diagnosis not present

## 2022-03-25 DIAGNOSIS — Z510 Encounter for antineoplastic radiation therapy: Secondary | ICD-10-CM | POA: Diagnosis not present

## 2022-03-25 DIAGNOSIS — C3492 Malignant neoplasm of unspecified part of left bronchus or lung: Secondary | ICD-10-CM

## 2022-03-25 DIAGNOSIS — Z5111 Encounter for antineoplastic chemotherapy: Secondary | ICD-10-CM | POA: Diagnosis not present

## 2022-03-25 DIAGNOSIS — Z87891 Personal history of nicotine dependence: Secondary | ICD-10-CM | POA: Diagnosis not present

## 2022-03-25 DIAGNOSIS — Z79899 Other long term (current) drug therapy: Secondary | ICD-10-CM | POA: Diagnosis not present

## 2022-03-25 DIAGNOSIS — M47812 Spondylosis without myelopathy or radiculopathy, cervical region: Secondary | ICD-10-CM | POA: Diagnosis not present

## 2022-03-25 LAB — RAD ONC ARIA SESSION SUMMARY
Course Elapsed Days: 20
Plan Fractions Treated to Date: 15
Plan Prescribed Dose Per Fraction: 2 Gy
Plan Total Fractions Prescribed: 33
Plan Total Prescribed Dose: 66 Gy
Reference Point Dosage Given to Date: 30 Gy
Reference Point Session Dosage Given: 2 Gy
Session Number: 15

## 2022-03-25 LAB — CBC WITH DIFFERENTIAL (CANCER CENTER ONLY)
Abs Immature Granulocytes: 0.02 10*3/uL (ref 0.00–0.07)
Basophils Absolute: 0 10*3/uL (ref 0.0–0.1)
Basophils Relative: 1 %
Eosinophils Absolute: 0.1 10*3/uL (ref 0.0–0.5)
Eosinophils Relative: 5 %
HCT: 31.8 % — ABNORMAL LOW (ref 36.0–46.0)
Hemoglobin: 10.8 g/dL — ABNORMAL LOW (ref 12.0–15.0)
Immature Granulocytes: 1 %
Lymphocytes Relative: 31 %
Lymphs Abs: 0.9 10*3/uL (ref 0.7–4.0)
MCH: 29.2 pg (ref 26.0–34.0)
MCHC: 34 g/dL (ref 30.0–36.0)
MCV: 85.9 fL (ref 80.0–100.0)
Monocytes Absolute: 0.3 10*3/uL (ref 0.1–1.0)
Monocytes Relative: 10 %
Neutro Abs: 1.5 10*3/uL — ABNORMAL LOW (ref 1.7–7.7)
Neutrophils Relative %: 52 %
Platelet Count: 191 10*3/uL (ref 150–400)
RBC: 3.7 MIL/uL — ABNORMAL LOW (ref 3.87–5.11)
RDW: 14 % (ref 11.5–15.5)
WBC Count: 2.9 10*3/uL — ABNORMAL LOW (ref 4.0–10.5)
nRBC: 0 % (ref 0.0–0.2)

## 2022-03-25 LAB — CMP (CANCER CENTER ONLY)
ALT: 11 U/L (ref 0–44)
AST: 12 U/L — ABNORMAL LOW (ref 15–41)
Albumin: 4.1 g/dL (ref 3.5–5.0)
Alkaline Phosphatase: 59 U/L (ref 38–126)
Anion gap: 5 (ref 5–15)
BUN: 23 mg/dL (ref 8–23)
CO2: 26 mmol/L (ref 22–32)
Calcium: 9.3 mg/dL (ref 8.9–10.3)
Chloride: 105 mmol/L (ref 98–111)
Creatinine: 1.17 mg/dL — ABNORMAL HIGH (ref 0.44–1.00)
GFR, Estimated: 49 mL/min — ABNORMAL LOW (ref >60)
Glucose, Bld: 83 mg/dL (ref 70–99)
Potassium: 5.1 mmol/L (ref 3.5–5.1)
Sodium: 136 mmol/L (ref 135–145)
Total Bilirubin: 0.4 mg/dL (ref 0.3–1.2)
Total Protein: 7.7 g/dL (ref 6.5–8.1)

## 2022-03-25 MED ORDER — FAMOTIDINE IN NACL 20-0.9 MG/50ML-% IV SOLN
20.0000 mg | Freq: Once | INTRAVENOUS | Status: AC
  Administered 2022-03-25: 20 mg via INTRAVENOUS
  Filled 2022-03-25: qty 50

## 2022-03-25 MED ORDER — SODIUM CHLORIDE 0.9 % IV SOLN
147.2000 mg | Freq: Once | INTRAVENOUS | Status: AC
  Administered 2022-03-25: 150 mg via INTRAVENOUS
  Filled 2022-03-25: qty 15

## 2022-03-25 MED ORDER — SODIUM CHLORIDE 0.9 % IV SOLN
45.0000 mg/m2 | Freq: Once | INTRAVENOUS | Status: AC
  Administered 2022-03-25: 84 mg via INTRAVENOUS
  Filled 2022-03-25: qty 14

## 2022-03-25 MED ORDER — SODIUM CHLORIDE 0.9 % IV SOLN: Freq: Once | INTRAVENOUS | Status: AC

## 2022-03-25 MED ORDER — PALONOSETRON HCL INJECTION 0.25 MG/5ML
0.2500 mg | Freq: Once | INTRAVENOUS | Status: AC
  Administered 2022-03-25: 0.25 mg via INTRAVENOUS
  Filled 2022-03-25: qty 5

## 2022-03-25 MED ORDER — SODIUM CHLORIDE 0.9 % IV SOLN
10.0000 mg | Freq: Once | INTRAVENOUS | Status: AC
  Administered 2022-03-25: 10 mg via INTRAVENOUS
  Filled 2022-03-25: qty 10

## 2022-03-25 MED ORDER — CETIRIZINE HCL 10 MG/ML IV SOLN
10.0000 mg | Freq: Once | INTRAVENOUS | Status: AC
  Administered 2022-03-25: 10 mg via INTRAVENOUS
  Filled 2022-03-25: qty 1

## 2022-03-25 NOTE — Patient Instructions (Signed)
[facility] ONCOLOGY  Discharge Instructions: Thank you for choosing [facility] to provide your oncology and hematology care.   If you have a lab appointment with the [facility], please go directly to the [facility] and check in at the registration area.   Wear comfortable clothing and clothing appropriate for easy access to any Portacath or PICC line.   We strive to give you quality time with your provider. You may need to reschedule your appointment if you arrive late (15 or more minutes).  Arriving late affects you and other patients whose appointments are after yours.  Also, if you miss three or more appointments without notifying the office, you may be dismissed from the clinic at the provider's discretion.      For prescription refill requests, have your pharmacy contact our office and allow 72 hours for refills to be completed.    Today you received the following chemotherapy and/or immunotherapy agents: Taxol/Carboplatin      To help prevent nausea and vomiting after your treatment, we encourage you to take your nausea medication as directed.  BELOW ARE SYMPTOMS THAT SHOULD BE REPORTED IMMEDIATELY: *FEVER GREATER THAN 100.4 F (38 C) OR HIGHER *CHILLS OR SWEATING *NAUSEA AND VOMITING THAT IS NOT CONTROLLED WITH YOUR NAUSEA MEDICATION *UNUSUAL SHORTNESS OF BREATH *UNUSUAL BRUISING OR BLEEDING *URINARY PROBLEMS (pain or burning when urinating, or frequent urination) *BOWEL PROBLEMS (unusual diarrhea, constipation, pain near the anus) TENDERNESS IN MOUTH AND THROAT WITH OR WITHOUT PRESENCE OF ULCERS (sore throat, sores in mouth, or a toothache) UNUSUAL RASH, SWELLING OR PAIN  UNUSUAL VAGINAL DISCHARGE OR ITCHING   Items with * indicate a potential emergency and should be followed up as soon as possible or go to the Emergency Department if any problems should occur.  Please show the CHEMOTHERAPY ALERT CARD or IMMUNOTHERAPY ALERT CARD at  check-in to the Emergency Department and triage nurse.  Should you have questions after your visit or need to cancel or reschedule your appointment, please contact [facility]  Dept: [phone]  and follow the prompts.  Office hours are 8:00 a.m. to 4:30 p.m. Monday - Friday. Please note that voicemails left after 4:00 p.m. may not be returned until the following business day.  We are closed weekends and major holidays. You have access to a nurse at all times for urgent questions. Please call the main number to the clinic Dept: [phone] and follow the prompts.   For any non-urgent questions, you may also contact your provider using MyChart. We now offer e-Visits for anyone 41 and older to request care online for non-urgent symptoms. For details visit [email or web address].   Also download the MyChart app! Go to the app store, search "MyChart", open the app, select , and log in with your MyChart username and password.  Masks are optional in the cancer centers. If you would like for your care team to wear a mask while they are taking care of you, please let them know. You may have one support person who is at least 75 years old accompany you for your appointments.

## 2022-03-26 ENCOUNTER — Other Ambulatory Visit: Payer: Self-pay

## 2022-03-26 ENCOUNTER — Ambulatory Visit
Admission: RE | Admit: 2022-03-26 | Discharge: 2022-03-26 | Disposition: A | Discharge status: Home / Self Care | Payer: Medicare Other | Source: Ambulatory Visit | Attending: Radiation Oncology | Admitting: Radiation Oncology

## 2022-03-26 DIAGNOSIS — Z87891 Personal history of nicotine dependence: Secondary | ICD-10-CM | POA: Diagnosis not present

## 2022-03-26 DIAGNOSIS — C3412 Malignant neoplasm of upper lobe, left bronchus or lung: Secondary | ICD-10-CM | POA: Diagnosis not present

## 2022-03-26 DIAGNOSIS — Z5111 Encounter for antineoplastic chemotherapy: Secondary | ICD-10-CM | POA: Diagnosis not present

## 2022-03-26 DIAGNOSIS — C778 Secondary and unspecified malignant neoplasm of lymph nodes of multiple regions: Secondary | ICD-10-CM | POA: Diagnosis not present

## 2022-03-26 DIAGNOSIS — Z79899 Other long term (current) drug therapy: Secondary | ICD-10-CM | POA: Diagnosis not present

## 2022-03-26 DIAGNOSIS — Z510 Encounter for antineoplastic radiation therapy: Secondary | ICD-10-CM | POA: Diagnosis not present

## 2022-03-26 DIAGNOSIS — M47812 Spondylosis without myelopathy or radiculopathy, cervical region: Secondary | ICD-10-CM | POA: Diagnosis not present

## 2022-03-26 LAB — RAD ONC ARIA SESSION SUMMARY
Course Elapsed Days: 21
Plan Fractions Treated to Date: 16
Plan Prescribed Dose Per Fraction: 2 Gy
Plan Total Fractions Prescribed: 33
Plan Total Prescribed Dose: 66 Gy
Reference Point Dosage Given to Date: 32 Gy
Reference Point Session Dosage Given: 2 Gy
Session Number: 16

## 2022-03-27 ENCOUNTER — Other Ambulatory Visit: Payer: Self-pay

## 2022-03-27 ENCOUNTER — Ambulatory Visit
Admission: RE | Admit: 2022-03-27 | Discharge: 2022-03-27 | Disposition: A | Discharge status: Home / Self Care | Payer: Medicare Other | Source: Ambulatory Visit | Attending: Radiation Oncology | Admitting: Radiation Oncology

## 2022-03-27 DIAGNOSIS — R590 Localized enlarged lymph nodes: Secondary | ICD-10-CM | POA: Insufficient documentation

## 2022-03-27 DIAGNOSIS — Z510 Encounter for antineoplastic radiation therapy: Secondary | ICD-10-CM | POA: Insufficient documentation

## 2022-03-27 DIAGNOSIS — C778 Secondary and unspecified malignant neoplasm of lymph nodes of multiple regions: Secondary | ICD-10-CM | POA: Insufficient documentation

## 2022-03-27 DIAGNOSIS — K5900 Constipation, unspecified: Secondary | ICD-10-CM | POA: Diagnosis not present

## 2022-03-27 DIAGNOSIS — C3412 Malignant neoplasm of upper lobe, left bronchus or lung: Secondary | ICD-10-CM | POA: Insufficient documentation

## 2022-03-27 DIAGNOSIS — M47812 Spondylosis without myelopathy or radiculopathy, cervical region: Secondary | ICD-10-CM | POA: Insufficient documentation

## 2022-03-27 DIAGNOSIS — Z5111 Encounter for antineoplastic chemotherapy: Secondary | ICD-10-CM | POA: Insufficient documentation

## 2022-03-27 DIAGNOSIS — Z79899 Other long term (current) drug therapy: Secondary | ICD-10-CM | POA: Insufficient documentation

## 2022-03-27 DIAGNOSIS — Z87891 Personal history of nicotine dependence: Secondary | ICD-10-CM | POA: Diagnosis not present

## 2022-03-27 LAB — RAD ONC ARIA SESSION SUMMARY
Course Elapsed Days: 22
Plan Fractions Treated to Date: 17
Plan Prescribed Dose Per Fraction: 2 Gy
Plan Total Fractions Prescribed: 33
Plan Total Prescribed Dose: 66 Gy
Reference Point Dosage Given to Date: 34 Gy
Reference Point Session Dosage Given: 2 Gy
Session Number: 17

## 2022-03-28 ENCOUNTER — Other Ambulatory Visit: Payer: Self-pay

## 2022-03-28 ENCOUNTER — Ambulatory Visit
Admission: RE | Admit: 2022-03-28 | Discharge: 2022-03-28 | Disposition: A | Discharge status: Home / Self Care | Payer: Medicare Other | Source: Ambulatory Visit | Attending: Radiation Oncology | Admitting: Radiation Oncology

## 2022-03-28 DIAGNOSIS — C778 Secondary and unspecified malignant neoplasm of lymph nodes of multiple regions: Secondary | ICD-10-CM | POA: Diagnosis not present

## 2022-03-28 DIAGNOSIS — Z79899 Other long term (current) drug therapy: Secondary | ICD-10-CM | POA: Diagnosis not present

## 2022-03-28 DIAGNOSIS — R590 Localized enlarged lymph nodes: Secondary | ICD-10-CM | POA: Diagnosis not present

## 2022-03-28 DIAGNOSIS — C3412 Malignant neoplasm of upper lobe, left bronchus or lung: Secondary | ICD-10-CM | POA: Diagnosis not present

## 2022-03-28 DIAGNOSIS — Z5111 Encounter for antineoplastic chemotherapy: Secondary | ICD-10-CM | POA: Diagnosis not present

## 2022-03-28 DIAGNOSIS — M47812 Spondylosis without myelopathy or radiculopathy, cervical region: Secondary | ICD-10-CM | POA: Diagnosis not present

## 2022-03-28 DIAGNOSIS — Z87891 Personal history of nicotine dependence: Secondary | ICD-10-CM | POA: Diagnosis not present

## 2022-03-28 DIAGNOSIS — K5900 Constipation, unspecified: Secondary | ICD-10-CM | POA: Diagnosis not present

## 2022-03-28 DIAGNOSIS — Z510 Encounter for antineoplastic radiation therapy: Secondary | ICD-10-CM | POA: Diagnosis not present

## 2022-03-28 LAB — RAD ONC ARIA SESSION SUMMARY
Course Elapsed Days: 23
Plan Fractions Treated to Date: 18
Plan Prescribed Dose Per Fraction: 2 Gy
Plan Total Fractions Prescribed: 33
Plan Total Prescribed Dose: 66 Gy
Reference Point Dosage Given to Date: 36 Gy
Reference Point Session Dosage Given: 2 Gy
Session Number: 18

## 2022-03-29 ENCOUNTER — Ambulatory Visit
Admission: RE | Admit: 2022-03-29 | Discharge: 2022-03-29 | Disposition: A | Discharge status: Home / Self Care | Payer: Medicare Other | Source: Ambulatory Visit | Attending: Radiation Oncology | Admitting: Radiation Oncology

## 2022-03-29 ENCOUNTER — Other Ambulatory Visit: Payer: Self-pay

## 2022-03-29 DIAGNOSIS — Z79899 Other long term (current) drug therapy: Secondary | ICD-10-CM | POA: Diagnosis not present

## 2022-03-29 DIAGNOSIS — Z5111 Encounter for antineoplastic chemotherapy: Secondary | ICD-10-CM | POA: Diagnosis not present

## 2022-03-29 DIAGNOSIS — C3412 Malignant neoplasm of upper lobe, left bronchus or lung: Secondary | ICD-10-CM | POA: Diagnosis not present

## 2022-03-29 DIAGNOSIS — C778 Secondary and unspecified malignant neoplasm of lymph nodes of multiple regions: Secondary | ICD-10-CM | POA: Diagnosis not present

## 2022-03-29 DIAGNOSIS — K5900 Constipation, unspecified: Secondary | ICD-10-CM | POA: Diagnosis not present

## 2022-03-29 DIAGNOSIS — Z87891 Personal history of nicotine dependence: Secondary | ICD-10-CM | POA: Diagnosis not present

## 2022-03-29 DIAGNOSIS — M47812 Spondylosis without myelopathy or radiculopathy, cervical region: Secondary | ICD-10-CM | POA: Diagnosis not present

## 2022-03-29 DIAGNOSIS — R590 Localized enlarged lymph nodes: Secondary | ICD-10-CM | POA: Diagnosis not present

## 2022-03-29 DIAGNOSIS — Z510 Encounter for antineoplastic radiation therapy: Secondary | ICD-10-CM | POA: Diagnosis not present

## 2022-03-29 LAB — RAD ONC ARIA SESSION SUMMARY
Course Elapsed Days: 24
Plan Fractions Treated to Date: 19
Plan Prescribed Dose Per Fraction: 2 Gy
Plan Total Fractions Prescribed: 33
Plan Total Prescribed Dose: 66 Gy
Reference Point Dosage Given to Date: 38 Gy
Reference Point Session Dosage Given: 2 Gy
Session Number: 19

## 2022-03-29 MED FILL — Dexamethasone Sodium Phosphate Inj 100 MG/10ML: INTRAMUSCULAR | Qty: 1 | Status: AC

## 2022-04-01 ENCOUNTER — Ambulatory Visit: Payer: Medicare Other

## 2022-04-01 ENCOUNTER — Other Ambulatory Visit: Payer: Self-pay

## 2022-04-01 ENCOUNTER — Inpatient Hospital Stay: Payer: Medicare Other | Attending: Internal Medicine

## 2022-04-01 ENCOUNTER — Ambulatory Visit
Admission: RE | Admit: 2022-04-01 | Discharge: 2022-04-01 | Disposition: A | Discharge status: Home / Self Care | Payer: Medicare Other | Source: Ambulatory Visit | Attending: Radiation Oncology | Admitting: Radiation Oncology

## 2022-04-01 ENCOUNTER — Inpatient Hospital Stay: Payer: Medicare Other

## 2022-04-01 ENCOUNTER — Other Ambulatory Visit: Payer: Self-pay | Admitting: Radiation Oncology

## 2022-04-01 VITALS — BP 116/74 | HR 71 | Temp 98.3°F | Resp 17 | Wt 173.0 lb

## 2022-04-01 DIAGNOSIS — K5900 Constipation, unspecified: Secondary | ICD-10-CM | POA: Insufficient documentation

## 2022-04-01 DIAGNOSIS — Z510 Encounter for antineoplastic radiation therapy: Secondary | ICD-10-CM | POA: Diagnosis not present

## 2022-04-01 DIAGNOSIS — C3412 Malignant neoplasm of upper lobe, left bronchus or lung: Secondary | ICD-10-CM

## 2022-04-01 DIAGNOSIS — R590 Localized enlarged lymph nodes: Secondary | ICD-10-CM | POA: Diagnosis not present

## 2022-04-01 DIAGNOSIS — Z87891 Personal history of nicotine dependence: Secondary | ICD-10-CM | POA: Diagnosis not present

## 2022-04-01 DIAGNOSIS — C3492 Malignant neoplasm of unspecified part of left bronchus or lung: Secondary | ICD-10-CM

## 2022-04-01 DIAGNOSIS — Z5111 Encounter for antineoplastic chemotherapy: Secondary | ICD-10-CM | POA: Insufficient documentation

## 2022-04-01 DIAGNOSIS — C778 Secondary and unspecified malignant neoplasm of lymph nodes of multiple regions: Secondary | ICD-10-CM | POA: Diagnosis not present

## 2022-04-01 DIAGNOSIS — Z79899 Other long term (current) drug therapy: Secondary | ICD-10-CM | POA: Insufficient documentation

## 2022-04-01 DIAGNOSIS — M47812 Spondylosis without myelopathy or radiculopathy, cervical region: Secondary | ICD-10-CM | POA: Diagnosis not present

## 2022-04-01 LAB — CMP (CANCER CENTER ONLY)
ALT: 11 U/L (ref 0–44)
AST: 12 U/L — ABNORMAL LOW (ref 15–41)
Albumin: 4.2 g/dL (ref 3.5–5.0)
Alkaline Phosphatase: 63 U/L (ref 38–126)
Anion gap: 6 (ref 5–15)
BUN: 23 mg/dL (ref 8–23)
CO2: 24 mmol/L (ref 22–32)
Calcium: 9.4 mg/dL (ref 8.9–10.3)
Chloride: 106 mmol/L (ref 98–111)
Creatinine: 1.13 mg/dL — ABNORMAL HIGH (ref 0.44–1.00)
GFR, Estimated: 51 mL/min — ABNORMAL LOW (ref >60)
Glucose, Bld: 83 mg/dL (ref 70–99)
Potassium: 4.7 mmol/L (ref 3.5–5.1)
Sodium: 136 mmol/L (ref 135–145)
Total Bilirubin: 0.4 mg/dL (ref 0.3–1.2)
Total Protein: 7.7 g/dL (ref 6.5–8.1)

## 2022-04-01 LAB — CBC WITH DIFFERENTIAL (CANCER CENTER ONLY)
Abs Immature Granulocytes: 0.01 10*3/uL (ref 0.00–0.07)
Basophils Absolute: 0 10*3/uL (ref 0.0–0.1)
Basophils Relative: 1 %
Eosinophils Absolute: 0.1 10*3/uL (ref 0.0–0.5)
Eosinophils Relative: 4 %
HCT: 33.7 % — ABNORMAL LOW (ref 36.0–46.0)
Hemoglobin: 11.2 g/dL — ABNORMAL LOW (ref 12.0–15.0)
Immature Granulocytes: 0 %
Lymphocytes Relative: 28 %
Lymphs Abs: 0.7 10*3/uL (ref 0.7–4.0)
MCH: 28.6 pg (ref 26.0–34.0)
MCHC: 33.2 g/dL (ref 30.0–36.0)
MCV: 86.2 fL (ref 80.0–100.0)
Monocytes Absolute: 0.3 10*3/uL (ref 0.1–1.0)
Monocytes Relative: 10 %
Neutro Abs: 1.5 10*3/uL — ABNORMAL LOW (ref 1.7–7.7)
Neutrophils Relative %: 57 %
Platelet Count: 147 10*3/uL — ABNORMAL LOW (ref 150–400)
RBC: 3.91 MIL/uL (ref 3.87–5.11)
RDW: 14.4 % (ref 11.5–15.5)
WBC Count: 2.6 10*3/uL — ABNORMAL LOW (ref 4.0–10.5)
nRBC: 0 % (ref 0.0–0.2)

## 2022-04-01 LAB — RAD ONC ARIA SESSION SUMMARY
Course Elapsed Days: 27
Plan Fractions Treated to Date: 20
Plan Prescribed Dose Per Fraction: 2 Gy
Plan Total Fractions Prescribed: 33
Plan Total Prescribed Dose: 66 Gy
Reference Point Dosage Given to Date: 40 Gy
Reference Point Session Dosage Given: 2 Gy
Session Number: 20

## 2022-04-01 MED ORDER — SODIUM CHLORIDE 0.9 % IV SOLN
45.0000 mg/m2 | Freq: Once | INTRAVENOUS | Status: AC
  Administered 2022-04-01: 84 mg via INTRAVENOUS
  Filled 2022-04-01: qty 14

## 2022-04-01 MED ORDER — FAMOTIDINE IN NACL 20-0.9 MG/50ML-% IV SOLN
20.0000 mg | Freq: Once | INTRAVENOUS | Status: AC
  Administered 2022-04-01: 20 mg via INTRAVENOUS
  Filled 2022-04-01: qty 50

## 2022-04-01 MED ORDER — SODIUM CHLORIDE 0.9 % IV SOLN: Freq: Once | INTRAVENOUS | Status: AC

## 2022-04-01 MED ORDER — LIDOCAINE VISCOUS HCL 2 % MT SOLN: OROMUCOSAL | 3 refills | Status: AC

## 2022-04-01 MED ORDER — SODIUM CHLORIDE 0.9 % IV SOLN
10.0000 mg | Freq: Once | INTRAVENOUS | Status: AC
  Administered 2022-04-01: 10 mg via INTRAVENOUS
  Filled 2022-04-01: qty 10

## 2022-04-01 MED ORDER — SODIUM CHLORIDE 0.9 % IV SOLN
147.2000 mg | Freq: Once | INTRAVENOUS | Status: AC
  Administered 2022-04-01: 150 mg via INTRAVENOUS
  Filled 2022-04-01: qty 15

## 2022-04-01 MED ORDER — CETIRIZINE HCL 10 MG/ML IV SOLN
10.0000 mg | Freq: Once | INTRAVENOUS | Status: AC
  Administered 2022-04-01: 10 mg via INTRAVENOUS
  Filled 2022-04-01: qty 1

## 2022-04-01 MED ORDER — PALONOSETRON HCL INJECTION 0.25 MG/5ML
0.2500 mg | Freq: Once | INTRAVENOUS | Status: AC
  Administered 2022-04-01: 0.25 mg via INTRAVENOUS
  Filled 2022-04-01: qty 5

## 2022-04-02 ENCOUNTER — Other Ambulatory Visit: Payer: Self-pay

## 2022-04-02 ENCOUNTER — Ambulatory Visit
Admission: RE | Admit: 2022-04-02 | Discharge: 2022-04-02 | Disposition: A | Discharge status: Home / Self Care | Payer: Medicare Other | Source: Ambulatory Visit | Attending: Radiation Oncology | Admitting: Radiation Oncology

## 2022-04-02 DIAGNOSIS — R590 Localized enlarged lymph nodes: Secondary | ICD-10-CM | POA: Diagnosis not present

## 2022-04-02 DIAGNOSIS — C3412 Malignant neoplasm of upper lobe, left bronchus or lung: Secondary | ICD-10-CM | POA: Diagnosis not present

## 2022-04-02 DIAGNOSIS — C778 Secondary and unspecified malignant neoplasm of lymph nodes of multiple regions: Secondary | ICD-10-CM | POA: Diagnosis not present

## 2022-04-02 DIAGNOSIS — Z87891 Personal history of nicotine dependence: Secondary | ICD-10-CM | POA: Diagnosis not present

## 2022-04-02 DIAGNOSIS — Z79899 Other long term (current) drug therapy: Secondary | ICD-10-CM | POA: Diagnosis not present

## 2022-04-02 DIAGNOSIS — Z5111 Encounter for antineoplastic chemotherapy: Secondary | ICD-10-CM | POA: Diagnosis not present

## 2022-04-02 DIAGNOSIS — K5900 Constipation, unspecified: Secondary | ICD-10-CM | POA: Diagnosis not present

## 2022-04-02 DIAGNOSIS — M47812 Spondylosis without myelopathy or radiculopathy, cervical region: Secondary | ICD-10-CM | POA: Diagnosis not present

## 2022-04-02 DIAGNOSIS — Z510 Encounter for antineoplastic radiation therapy: Secondary | ICD-10-CM | POA: Diagnosis not present

## 2022-04-02 LAB — RAD ONC ARIA SESSION SUMMARY
Course Elapsed Days: 28
Plan Fractions Treated to Date: 21
Plan Prescribed Dose Per Fraction: 2 Gy
Plan Total Fractions Prescribed: 33
Plan Total Prescribed Dose: 66 Gy
Reference Point Dosage Given to Date: 42 Gy
Reference Point Session Dosage Given: 2 Gy
Session Number: 21

## 2022-04-03 ENCOUNTER — Ambulatory Visit
Admission: RE | Admit: 2022-04-03 | Discharge: 2022-04-03 | Disposition: A | Discharge status: Home / Self Care | Payer: Medicare Other | Source: Ambulatory Visit | Attending: Radiation Oncology | Admitting: Radiation Oncology

## 2022-04-03 ENCOUNTER — Other Ambulatory Visit: Payer: Self-pay

## 2022-04-03 ENCOUNTER — Telehealth: Payer: Self-pay | Admitting: Internal Medicine

## 2022-04-03 DIAGNOSIS — K5900 Constipation, unspecified: Secondary | ICD-10-CM | POA: Diagnosis not present

## 2022-04-03 DIAGNOSIS — C3412 Malignant neoplasm of upper lobe, left bronchus or lung: Secondary | ICD-10-CM | POA: Diagnosis not present

## 2022-04-03 DIAGNOSIS — Z79899 Other long term (current) drug therapy: Secondary | ICD-10-CM | POA: Diagnosis not present

## 2022-04-03 DIAGNOSIS — R590 Localized enlarged lymph nodes: Secondary | ICD-10-CM | POA: Diagnosis not present

## 2022-04-03 DIAGNOSIS — M47812 Spondylosis without myelopathy or radiculopathy, cervical region: Secondary | ICD-10-CM | POA: Diagnosis not present

## 2022-04-03 DIAGNOSIS — Z87891 Personal history of nicotine dependence: Secondary | ICD-10-CM | POA: Diagnosis not present

## 2022-04-03 DIAGNOSIS — Z510 Encounter for antineoplastic radiation therapy: Secondary | ICD-10-CM | POA: Diagnosis not present

## 2022-04-03 DIAGNOSIS — C778 Secondary and unspecified malignant neoplasm of lymph nodes of multiple regions: Secondary | ICD-10-CM | POA: Diagnosis not present

## 2022-04-03 DIAGNOSIS — Z5111 Encounter for antineoplastic chemotherapy: Secondary | ICD-10-CM | POA: Diagnosis not present

## 2022-04-03 LAB — RAD ONC ARIA SESSION SUMMARY
Course Elapsed Days: 29
Plan Fractions Treated to Date: 22
Plan Prescribed Dose Per Fraction: 2 Gy
Plan Total Fractions Prescribed: 33
Plan Total Prescribed Dose: 66 Gy
Reference Point Dosage Given to Date: 44 Gy
Reference Point Session Dosage Given: 2 Gy
Session Number: 22

## 2022-04-03 NOTE — Telephone Encounter (Signed)
Called patient regarding upcoming November appointments, patient is notified. 

## 2022-04-04 ENCOUNTER — Other Ambulatory Visit: Payer: Self-pay

## 2022-04-04 ENCOUNTER — Ambulatory Visit
Admission: RE | Admit: 2022-04-04 | Discharge: 2022-04-04 | Disposition: A | Discharge status: Home / Self Care | Payer: Medicare Other | Source: Ambulatory Visit | Attending: Radiation Oncology | Admitting: Radiation Oncology

## 2022-04-04 DIAGNOSIS — Z5111 Encounter for antineoplastic chemotherapy: Secondary | ICD-10-CM | POA: Diagnosis not present

## 2022-04-04 DIAGNOSIS — K5900 Constipation, unspecified: Secondary | ICD-10-CM | POA: Diagnosis not present

## 2022-04-04 DIAGNOSIS — C3412 Malignant neoplasm of upper lobe, left bronchus or lung: Secondary | ICD-10-CM | POA: Diagnosis not present

## 2022-04-04 DIAGNOSIS — Z87891 Personal history of nicotine dependence: Secondary | ICD-10-CM | POA: Diagnosis not present

## 2022-04-04 DIAGNOSIS — Z510 Encounter for antineoplastic radiation therapy: Secondary | ICD-10-CM | POA: Diagnosis not present

## 2022-04-04 DIAGNOSIS — Z79899 Other long term (current) drug therapy: Secondary | ICD-10-CM | POA: Diagnosis not present

## 2022-04-04 DIAGNOSIS — M47812 Spondylosis without myelopathy or radiculopathy, cervical region: Secondary | ICD-10-CM | POA: Diagnosis not present

## 2022-04-04 DIAGNOSIS — C778 Secondary and unspecified malignant neoplasm of lymph nodes of multiple regions: Secondary | ICD-10-CM | POA: Diagnosis not present

## 2022-04-04 DIAGNOSIS — R590 Localized enlarged lymph nodes: Secondary | ICD-10-CM | POA: Diagnosis not present

## 2022-04-04 LAB — RAD ONC ARIA SESSION SUMMARY
Course Elapsed Days: 30
Plan Fractions Treated to Date: 23
Plan Prescribed Dose Per Fraction: 2 Gy
Plan Total Fractions Prescribed: 33
Plan Total Prescribed Dose: 66 Gy
Reference Point Dosage Given to Date: 46 Gy
Reference Point Session Dosage Given: 2 Gy
Session Number: 23

## 2022-04-04 NOTE — Progress Notes (Signed)
[facility] OFFICE PROGRESS NOTE  [name], [place] [address] [id number]  DIAGNOSIS: Stage IIIb (T1a, N3, M0) non-small cell lung cancer, adenosquamous carcinoma presented with left upper lobe pulmonary nodule in addition to left hilar, mediastinal and left supraclavicular and cervical lymphadenopathy diagnosed in September 2023.   Detected Alteration(s) / Biomarker(s)          Associated FDA-approved therapies  Clinical Trial Availability          % cfDNA or Amplification   TP53 [id number] None Yes         1.6%   NFE2L2 D29V None Yes        1.2%   TP53 V216M None Yes          0.4%   TMB 18.02 mut/Mb   PDL1 Expression: 80%  PRIOR THERAPY: None  CURRENT THERAPY:  A course of concurrent chemoradiation with weekly carboplatin for AUC of 2 and paclitaxel 45 Mg/M2.  First dose March 11, 2022. Status post 6 cycles   INTERVAL HISTORY: Yolanda Robertson 76 y.o. female returns to the clinic today for a follow-up visit accompanied by her husband.  The patient is feeling fairly well today without any concerning complaints.  She is currently undergoing a course of concurrent chemoradiation.  She is tolerating this well without any concerning adverse side effects.  Her last day radiation is scheduled for 04/17/22.  Since is being seen, the patient denies any changes in her health.  Denies any fever, chills, drenching night sweats, or unexplained weight loss.  Although she notes she does sweat occasional from the temperature at night. Denies any worsening chest pain, shortness of breath, cough, or any hemoptysis. She previously stated she used to have a cough when she first started treatment but it went away. She reports some burning in esophagus but notes the medicine provided by Radiation oncology helps. Denies any nausea, vomiting, diarrhea. She notes occasional constipation but is controlled. Denies any headache or visual changes. She mentions the left cervical lymph  node has been getting smaller.   She notes a soreness of her left outer breast occasionally. She states she is up to date on her mammograms and they have been normal.   She is here today for evaluation and repeat blood work before undergoing cycle #6   MEDICAL HISTORY: Past Medical History:  Diagnosis Date   Hyperlipidemia    Hypertension    Lung mass    left upper lobe    ALLERGIES:  has No Known Allergies.  MEDICATIONS:  Current Outpatient Medications  Medication Sig Dispense Refill   benzonatate (TESSALON) 200 MG capsule Take 1 capsule (200 mg total) by mouth 3 (three) times daily as needed for cough. 30 capsule 1   ibuprofen (ADVIL) 200 MG tablet Take 200 mg by mouth every 8 (eight) hours as needed (pain.).     indapamide (LOZOL) 1.25 MG tablet Take 1.25 mg by mouth in the morning.     lidocaine (XYLOCAINE) 2 % solution Patient: Mix 1part 2% viscous lidocaine, 1part H20. Swallow 64m of diluted mixture, 312m before meals and at bedtime, up to QID prn soreness 200 mL 3   Polyvinyl Alcohol-Povidone PF (REFRESH) 1.4-0.6 % SOLN Place 1-2 drops into both eyes 3 (three) times daily as needed (dry/irritated eyes.).     prochlorperazine (COMPAZINE) 10 MG tablet Take 1 tablet (10 mg total) by mouth every 6 (six) hours as needed for nausea or vomiting. 30 tablet  0   rosuvastatin (CRESTOR) 10 MG tablet Take 10 mg by mouth in the morning.     No current facility-administered medications for this visit.    SURGICAL HISTORY:  Past Surgical History:  Procedure Laterality Date   FINE NEEDLE ASPIRATION  02/12/2022   Procedure: FINE NEEDLE ASPIRATION (FNA) LINEAR;  Surgeon: Garner Nash, DO;  Location: [facility];  Service: Pulmonary;;   VAGINAL HYSTERECTOMY     VIDEO BRONCHOSCOPY WITH ENDOBRONCHIAL ULTRASOUND Left 02/12/2022   Procedure: VIDEO BRONCHOSCOPY WITH ENDOBRONCHIAL ULTRASOUND;  Surgeon: Garner Nash, DO;  Location: [facility];  Service: Pulmonary;  Laterality: Left;     REVIEW OF SYSTEMS:   Review of Systems  Constitutional: Negative for appetite change, chills, fatigue, fever and unexpected weight change.  HENT:  Negative for mouth sores, nosebleeds, sore throat and trouble swallowing.   Eyes: Negative for eye problems and icterus.  Respiratory: Negative for cough, hemoptysis, shortness of breath and wheezing.   Cardiovascular: Negative for chest pain and leg swelling.  Gastrointestinal: Negative for abdominal pain, constipation, diarrhea, nausea and vomiting.  Genitourinary: Negative for bladder incontinence, difficulty urinating, dysuria, frequency and hematuria.   Musculoskeletal: Negative for back pain, gait problem, neck pain and neck stiffness.  Skin: Negative for itching and rash. Reports left outer breast soreness occasionally  Neurological: Negative for dizziness, extremity weakness, gait problem, headaches, light-headedness and seizures.  Hematological: Negative for adenopathy. Does not bruise/bleed easily.  Psychiatric/Behavioral: Negative for confusion, depression and sleep disturbance. The patient is not nervous/anxious.     PHYSICAL EXAMINATION:  Blood pressure 112/82, pulse 64, temperature 98.2 F (36.8 C), temperature source Oral, resp. rate 15, weight 175 lb 4.8 oz (79.5 kg), SpO2 99 %.  ECOG PERFORMANCE STATUS: 1  Physical Exam  Constitutional: Oriented to person, place, and time and well-developed, well-nourished, and in no distress.  HENT:  Head: Normocephalic and atraumatic.  Mouth/Throat: Oropharynx is clear and moist. No oropharyngeal exudate.  Eyes: Conjunctivae are normal. Right eye exhibits no discharge. Left eye exhibits no discharge. No scleral icterus.  Neck: Normal range of motion. Neck supple.  Cardiovascular: Normal rate, regular rhythm, normal heart sounds and intact distal pulses.   Pulmonary/Chest: Effort normal and breath sounds normal. No respiratory distress. No wheezes. No rales.  Abdominal: Soft. Bowel  sounds are normal. Exhibits no distension and no mass. There is no tenderness.  Musculoskeletal: Normal range of motion. Exhibits no edema.  Lymphadenopathy:    Left cervical adenopathy.  Neurological: Alert and oriented to person, place, and time. Exhibits normal muscle tone. Gait normal. Coordination normal.  Skin: Skin is warm and dry. No rash noted. Not diaphoretic. No erythema. No pallor.  Psychiatric: Mood, memory and judgment normal.  Vitals reviewed.  LABORATORY DATA: Lab Results  Component Value Date   WBC 2.3 (L) 04/08/2022   HGB 10.6 (L) 04/08/2022   HCT 32.1 (L) 04/08/2022   MCV 87.0 04/08/2022   PLT 137 (L) 04/08/2022      Chemistry      Component Value Date/Time   NA 138 04/08/2022 0826   K 4.8 04/08/2022 0826   CL 109 04/08/2022 0826   CO2 24 04/08/2022 0826   BUN 25 (H) 04/08/2022 0826   CREATININE 1.12 (H) 04/08/2022 0826      Component Value Date/Time   CALCIUM 9.1 04/08/2022 0826   ALKPHOS 58 04/08/2022 0826   AST 11 (L) 04/08/2022 0826   ALT 11 04/08/2022 0826   BILITOT 0.3 04/08/2022 6979  RADIOGRAPHIC STUDIES:  No results found.   ASSESSMENT/PLAN:  This is a very pleasant 76 year old African-American female with stage IIIb (T1a, N3, M0) non-small cell lung cancer, adenosquamous carcinoma.  The patient presented with a left upper lobe pulmonary nodule in addition to left hilar, mediastinal, left supraclavicular and cervical lymphadenopathy.  She was diagnosed in September 2023.   The patient does not have any actual mutations.  Her PD-L1 expression is 80%.   The patient is currently undergoing a course of concurrent chemoradiation with carboplatin for an AUC of 2 paclitaxel 45 mg per metered square.  The patient's first dose of treatment was on 03/11/2022.  She status post 5 cycles.  She is tolerating it well without any concerning adverse side effects.    Labs were reviewed. Her total WBC is 2.3 and ANC 1.5.  Recommend that she proceed  with cycle #6 today scheduled.    Her last day radiation is tentatively scheduled for 04/17/2022. Therefore, her last chemotherapy will likely be on 04/15/22 if labs permit.  I will arrange for restaging CT scan of the chest to be performed approximately 3 weeks after completing radiation.  We will see the patient back for follow-up visit after her scan to review the results and more detailed discussion about the next steps.  The patient was advised to call immediately if she has any concerning symptoms in the interval. The patient voices understanding of current disease status and treatment options and is in agreement with the current care plan. All questions were answered. The patient knows to call the clinic with any problems, questions or concerns. We can certainly see the patient much sooner if necessary    Orders Placed This Encounter  Procedures   CT Chest W Contrast    Standing Status:   Future    Standing Expiration Date:   04/08/2023    Order Specific Question:   If indicated for the ordered procedure, I authorize the administration of contrast media per Radiology protocol    Answer:   Yes    Order Specific Question:   Preferred imaging location?    Answer:   [facility]      The total time spent in the appointment was 20-29 minutes.   Dalayah Deahl L Zaynah Chawla, PA-C 04/08/22

## 2022-04-05 ENCOUNTER — Ambulatory Visit
Admission: RE | Admit: 2022-04-05 | Discharge: 2022-04-05 | Disposition: A | Discharge status: Home / Self Care | Payer: Medicare Other | Source: Ambulatory Visit | Attending: Radiation Oncology | Admitting: Radiation Oncology

## 2022-04-05 ENCOUNTER — Other Ambulatory Visit: Payer: Self-pay

## 2022-04-05 DIAGNOSIS — K5900 Constipation, unspecified: Secondary | ICD-10-CM | POA: Diagnosis not present

## 2022-04-05 DIAGNOSIS — R590 Localized enlarged lymph nodes: Secondary | ICD-10-CM | POA: Diagnosis not present

## 2022-04-05 DIAGNOSIS — Z5111 Encounter for antineoplastic chemotherapy: Secondary | ICD-10-CM | POA: Diagnosis not present

## 2022-04-05 DIAGNOSIS — M47812 Spondylosis without myelopathy or radiculopathy, cervical region: Secondary | ICD-10-CM | POA: Diagnosis not present

## 2022-04-05 DIAGNOSIS — C3412 Malignant neoplasm of upper lobe, left bronchus or lung: Secondary | ICD-10-CM | POA: Diagnosis not present

## 2022-04-05 DIAGNOSIS — Z79899 Other long term (current) drug therapy: Secondary | ICD-10-CM | POA: Diagnosis not present

## 2022-04-05 DIAGNOSIS — C778 Secondary and unspecified malignant neoplasm of lymph nodes of multiple regions: Secondary | ICD-10-CM | POA: Diagnosis not present

## 2022-04-05 DIAGNOSIS — Z87891 Personal history of nicotine dependence: Secondary | ICD-10-CM | POA: Diagnosis not present

## 2022-04-05 DIAGNOSIS — Z510 Encounter for antineoplastic radiation therapy: Secondary | ICD-10-CM | POA: Diagnosis not present

## 2022-04-05 LAB — RAD ONC ARIA SESSION SUMMARY
Course Elapsed Days: 31
Plan Fractions Treated to Date: 24
Plan Prescribed Dose Per Fraction: 2 Gy
Plan Total Fractions Prescribed: 33
Plan Total Prescribed Dose: 66 Gy
Reference Point Dosage Given to Date: 48 Gy
Reference Point Session Dosage Given: 2 Gy
Session Number: 24

## 2022-04-05 MED FILL — Dexamethasone Sodium Phosphate Inj 100 MG/10ML: INTRAMUSCULAR | Qty: 1 | Status: AC

## 2022-04-08 ENCOUNTER — Other Ambulatory Visit: Payer: Self-pay

## 2022-04-08 ENCOUNTER — Inpatient Hospital Stay: Payer: Medicare Other

## 2022-04-08 ENCOUNTER — Ambulatory Visit: Payer: Medicare Other

## 2022-04-08 ENCOUNTER — Ambulatory Visit
Admission: RE | Admit: 2022-04-08 | Discharge: 2022-04-08 | Disposition: A | Discharge status: Home / Self Care | Payer: Medicare Other | Source: Ambulatory Visit | Attending: Radiation Oncology | Admitting: Radiation Oncology

## 2022-04-08 ENCOUNTER — Inpatient Hospital Stay (HOSPITAL_BASED_OUTPATIENT_CLINIC_OR_DEPARTMENT_OTHER): Payer: Medicare Other | Admitting: Physician Assistant

## 2022-04-08 VITALS — BP 112/82 | HR 64 | Temp 98.2°F | Resp 15 | Wt 175.3 lb

## 2022-04-08 DIAGNOSIS — Z5111 Encounter for antineoplastic chemotherapy: Secondary | ICD-10-CM | POA: Diagnosis not present

## 2022-04-08 DIAGNOSIS — C3412 Malignant neoplasm of upper lobe, left bronchus or lung: Secondary | ICD-10-CM | POA: Diagnosis not present

## 2022-04-08 DIAGNOSIS — M47812 Spondylosis without myelopathy or radiculopathy, cervical region: Secondary | ICD-10-CM | POA: Diagnosis not present

## 2022-04-08 DIAGNOSIS — C3492 Malignant neoplasm of unspecified part of left bronchus or lung: Secondary | ICD-10-CM

## 2022-04-08 DIAGNOSIS — Z87891 Personal history of nicotine dependence: Secondary | ICD-10-CM | POA: Diagnosis not present

## 2022-04-08 DIAGNOSIS — Z79899 Other long term (current) drug therapy: Secondary | ICD-10-CM | POA: Diagnosis not present

## 2022-04-08 DIAGNOSIS — K5900 Constipation, unspecified: Secondary | ICD-10-CM | POA: Diagnosis not present

## 2022-04-08 DIAGNOSIS — R590 Localized enlarged lymph nodes: Secondary | ICD-10-CM | POA: Diagnosis not present

## 2022-04-08 DIAGNOSIS — Z510 Encounter for antineoplastic radiation therapy: Secondary | ICD-10-CM | POA: Diagnosis not present

## 2022-04-08 DIAGNOSIS — C778 Secondary and unspecified malignant neoplasm of lymph nodes of multiple regions: Secondary | ICD-10-CM | POA: Diagnosis not present

## 2022-04-08 LAB — CMP (CANCER CENTER ONLY)
ALT: 11 U/L (ref 0–44)
AST: 11 U/L — ABNORMAL LOW (ref 15–41)
Albumin: 4.1 g/dL (ref 3.5–5.0)
Alkaline Phosphatase: 58 U/L (ref 38–126)
Anion gap: 5 (ref 5–15)
BUN: 25 mg/dL — ABNORMAL HIGH (ref 8–23)
CO2: 24 mmol/L (ref 22–32)
Calcium: 9.1 mg/dL (ref 8.9–10.3)
Chloride: 109 mmol/L (ref 98–111)
Creatinine: 1.12 mg/dL — ABNORMAL HIGH (ref 0.44–1.00)
GFR, Estimated: 51 mL/min — ABNORMAL LOW (ref >60)
Glucose, Bld: 77 mg/dL (ref 70–99)
Potassium: 4.8 mmol/L (ref 3.5–5.1)
Sodium: 138 mmol/L (ref 135–145)
Total Bilirubin: 0.3 mg/dL (ref 0.3–1.2)
Total Protein: 7.5 g/dL (ref 6.5–8.1)

## 2022-04-08 LAB — RAD ONC ARIA SESSION SUMMARY
Course Elapsed Days: 34
Plan Fractions Treated to Date: 25
Plan Prescribed Dose Per Fraction: 2 Gy
Plan Total Fractions Prescribed: 33
Plan Total Prescribed Dose: 66 Gy
Reference Point Dosage Given to Date: 50 Gy
Reference Point Session Dosage Given: 2 Gy
Session Number: 25

## 2022-04-08 LAB — CBC WITH DIFFERENTIAL (CANCER CENTER ONLY)
Abs Immature Granulocytes: 0.01 10*3/uL (ref 0.00–0.07)
Basophils Absolute: 0 10*3/uL (ref 0.0–0.1)
Basophils Relative: 1 %
Eosinophils Absolute: 0.1 10*3/uL (ref 0.0–0.5)
Eosinophils Relative: 4 %
HCT: 32.1 % — ABNORMAL LOW (ref 36.0–46.0)
Hemoglobin: 10.6 g/dL — ABNORMAL LOW (ref 12.0–15.0)
Immature Granulocytes: 0 %
Lymphocytes Relative: 21 %
Lymphs Abs: 0.5 10*3/uL — ABNORMAL LOW (ref 0.7–4.0)
MCH: 28.7 pg (ref 26.0–34.0)
MCHC: 33 g/dL (ref 30.0–36.0)
MCV: 87 fL (ref 80.0–100.0)
Monocytes Absolute: 0.3 10*3/uL (ref 0.1–1.0)
Monocytes Relative: 11 %
Neutro Abs: 1.5 10*3/uL — ABNORMAL LOW (ref 1.7–7.7)
Neutrophils Relative %: 63 %
Platelet Count: 137 10*3/uL — ABNORMAL LOW (ref 150–400)
RBC: 3.69 MIL/uL — ABNORMAL LOW (ref 3.87–5.11)
RDW: 14.9 % (ref 11.5–15.5)
WBC Count: 2.3 10*3/uL — ABNORMAL LOW (ref 4.0–10.5)
nRBC: 0 % (ref 0.0–0.2)

## 2022-04-08 MED ORDER — PALONOSETRON HCL INJECTION 0.25 MG/5ML
0.2500 mg | Freq: Once | INTRAVENOUS | Status: AC
  Administered 2022-04-08: 0.25 mg via INTRAVENOUS
  Filled 2022-04-08: qty 5

## 2022-04-08 MED ORDER — FAMOTIDINE IN NACL 20-0.9 MG/50ML-% IV SOLN
20.0000 mg | Freq: Once | INTRAVENOUS | Status: AC
  Administered 2022-04-08: 20 mg via INTRAVENOUS
  Filled 2022-04-08: qty 50

## 2022-04-08 MED ORDER — SODIUM CHLORIDE 0.9 % IV SOLN
10.0000 mg | Freq: Once | INTRAVENOUS | Status: AC
  Administered 2022-04-08: 10 mg via INTRAVENOUS
  Filled 2022-04-08: qty 10

## 2022-04-08 MED ORDER — SODIUM CHLORIDE 0.9 % IV SOLN
147.2000 mg | Freq: Once | INTRAVENOUS | Status: AC
  Administered 2022-04-08: 150 mg via INTRAVENOUS
  Filled 2022-04-08: qty 15

## 2022-04-08 MED ORDER — SODIUM CHLORIDE 0.9 % IV SOLN: Freq: Once | INTRAVENOUS | Status: AC

## 2022-04-08 MED ORDER — SODIUM CHLORIDE 0.9 % IV SOLN
45.0000 mg/m2 | Freq: Once | INTRAVENOUS | Status: AC
  Administered 2022-04-08: 84 mg via INTRAVENOUS
  Filled 2022-04-08: qty 14

## 2022-04-08 MED ORDER — CETIRIZINE HCL 10 MG/ML IV SOLN
10.0000 mg | Freq: Once | INTRAVENOUS | Status: AC
  Administered 2022-04-08: 10 mg via INTRAVENOUS
  Filled 2022-04-08: qty 1

## 2022-04-08 NOTE — Patient Instructions (Signed)
[facility]  Discharge Instructions: Thank you for choosing [facility] to provide your oncology and hematology care.   If you have a lab appointment with the [facility], please go directly to the [facility] and check in at the registration area.   Wear comfortable clothing and clothing appropriate for easy access to any Portacath or PICC line.   We strive to give you quality time with your provider. You may need to reschedule your appointment if you arrive late (15 or more minutes).  Arriving late affects you and other patients whose appointments are after yours.  Also, if you miss three or more appointments without notifying the office, you may be dismissed from the clinic at the provider's discretion.      For prescription refill requests, have your pharmacy contact our office and allow 72 hours for refills to be completed.    Today you received the following chemotherapy and/or immunotherapy agents paclitaxel, carboplatin      To help prevent nausea and vomiting after your treatment, we encourage you to take your nausea medication as directed.  BELOW ARE SYMPTOMS THAT SHOULD BE REPORTED IMMEDIATELY: *FEVER GREATER THAN 100.4 F (38 C) OR HIGHER *CHILLS OR SWEATING *NAUSEA AND VOMITING THAT IS NOT CONTROLLED WITH YOUR NAUSEA MEDICATION *UNUSUAL SHORTNESS OF BREATH *UNUSUAL BRUISING OR BLEEDING *URINARY PROBLEMS (pain or burning when urinating, or frequent urination) *BOWEL PROBLEMS (unusual diarrhea, constipation, pain near the anus) TENDERNESS IN MOUTH AND THROAT WITH OR WITHOUT PRESENCE OF ULCERS (sore throat, sores in mouth, or a toothache) UNUSUAL RASH, SWELLING OR PAIN  UNUSUAL VAGINAL DISCHARGE OR ITCHING   Items with * indicate a potential emergency and should be followed up as soon as possible or go to the Emergency Department if any problems should occur.  Please show the CHEMOTHERAPY ALERT CARD or IMMUNOTHERAPY ALERT CARD at  check-in to the Emergency Department and triage nurse.  Should you have questions after your visit or need to cancel or reschedule your appointment, please contact [facility]  Dept: [phone]  and follow the prompts.  Office hours are 8:00 a.m. to 4:30 p.m. Monday - Friday. Please note that voicemails left after 4:00 p.m. may not be returned until the following business day.  We are closed weekends and major holidays. You have access to a nurse at all times for urgent questions. Please call the main number to the clinic Dept: [phone] and follow the prompts.   For any non-urgent questions, you may also contact your provider using MyChart. We now offer e-Visits for anyone 39 and older to request care online for non-urgent symptoms. For details visit [email or web address].   Also download the MyChart app! Go to the app store, search "MyChart", open the app, select [facility], and log in with your MyChart username and password.  Masks are optional in the cancer centers. If you would like for your care team to wear a mask while they are taking care of you, please let them know. You may have one support person who is at least 75 years old accompany you for your appointments.

## 2022-04-09 ENCOUNTER — Other Ambulatory Visit: Payer: Self-pay

## 2022-04-09 ENCOUNTER — Ambulatory Visit
Admission: RE | Admit: 2022-04-09 | Discharge: 2022-04-09 | Disposition: A | Discharge status: Home / Self Care | Payer: Medicare Other | Source: Ambulatory Visit | Attending: Radiation Oncology | Admitting: Radiation Oncology

## 2022-04-09 DIAGNOSIS — Z87891 Personal history of nicotine dependence: Secondary | ICD-10-CM | POA: Diagnosis not present

## 2022-04-09 DIAGNOSIS — R590 Localized enlarged lymph nodes: Secondary | ICD-10-CM | POA: Diagnosis not present

## 2022-04-09 DIAGNOSIS — C778 Secondary and unspecified malignant neoplasm of lymph nodes of multiple regions: Secondary | ICD-10-CM | POA: Diagnosis not present

## 2022-04-09 DIAGNOSIS — C3412 Malignant neoplasm of upper lobe, left bronchus or lung: Secondary | ICD-10-CM | POA: Diagnosis not present

## 2022-04-09 DIAGNOSIS — M47812 Spondylosis without myelopathy or radiculopathy, cervical region: Secondary | ICD-10-CM | POA: Diagnosis not present

## 2022-04-09 DIAGNOSIS — K5900 Constipation, unspecified: Secondary | ICD-10-CM | POA: Diagnosis not present

## 2022-04-09 DIAGNOSIS — Z79899 Other long term (current) drug therapy: Secondary | ICD-10-CM | POA: Diagnosis not present

## 2022-04-09 DIAGNOSIS — Z5111 Encounter for antineoplastic chemotherapy: Secondary | ICD-10-CM | POA: Diagnosis not present

## 2022-04-09 DIAGNOSIS — Z510 Encounter for antineoplastic radiation therapy: Secondary | ICD-10-CM | POA: Diagnosis not present

## 2022-04-09 LAB — RAD ONC ARIA SESSION SUMMARY
Course Elapsed Days: 35
Plan Fractions Treated to Date: 26
Plan Prescribed Dose Per Fraction: 2 Gy
Plan Total Fractions Prescribed: 33
Plan Total Prescribed Dose: 66 Gy
Reference Point Dosage Given to Date: 52 Gy
Reference Point Session Dosage Given: 2 Gy
Session Number: 26

## 2022-04-10 ENCOUNTER — Ambulatory Visit
Admission: RE | Admit: 2022-04-10 | Discharge: 2022-04-10 | Disposition: A | Discharge status: Home / Self Care | Payer: Medicare Other | Source: Ambulatory Visit | Attending: Radiation Oncology | Admitting: Radiation Oncology

## 2022-04-10 ENCOUNTER — Other Ambulatory Visit: Payer: Self-pay

## 2022-04-10 ENCOUNTER — Ambulatory Visit: Payer: Medicare Other

## 2022-04-10 DIAGNOSIS — K5900 Constipation, unspecified: Secondary | ICD-10-CM | POA: Diagnosis not present

## 2022-04-10 DIAGNOSIS — C3412 Malignant neoplasm of upper lobe, left bronchus or lung: Secondary | ICD-10-CM | POA: Diagnosis not present

## 2022-04-10 DIAGNOSIS — C778 Secondary and unspecified malignant neoplasm of lymph nodes of multiple regions: Secondary | ICD-10-CM | POA: Diagnosis not present

## 2022-04-10 DIAGNOSIS — Z510 Encounter for antineoplastic radiation therapy: Secondary | ICD-10-CM | POA: Diagnosis not present

## 2022-04-10 DIAGNOSIS — M47812 Spondylosis without myelopathy or radiculopathy, cervical region: Secondary | ICD-10-CM | POA: Diagnosis not present

## 2022-04-10 DIAGNOSIS — Z5111 Encounter for antineoplastic chemotherapy: Secondary | ICD-10-CM | POA: Diagnosis not present

## 2022-04-10 DIAGNOSIS — R590 Localized enlarged lymph nodes: Secondary | ICD-10-CM | POA: Diagnosis not present

## 2022-04-10 DIAGNOSIS — Z87891 Personal history of nicotine dependence: Secondary | ICD-10-CM | POA: Diagnosis not present

## 2022-04-10 DIAGNOSIS — Z79899 Other long term (current) drug therapy: Secondary | ICD-10-CM | POA: Diagnosis not present

## 2022-04-10 LAB — RAD ONC ARIA SESSION SUMMARY
Course Elapsed Days: 36
Plan Fractions Treated to Date: 27
Plan Prescribed Dose Per Fraction: 2 Gy
Plan Total Fractions Prescribed: 33
Plan Total Prescribed Dose: 66 Gy
Reference Point Dosage Given to Date: 54 Gy
Reference Point Session Dosage Given: 2 Gy
Session Number: 27

## 2022-04-11 ENCOUNTER — Other Ambulatory Visit: Payer: Self-pay

## 2022-04-11 ENCOUNTER — Ambulatory Visit
Admission: RE | Admit: 2022-04-11 | Discharge: 2022-04-11 | Disposition: A | Discharge status: Home / Self Care | Payer: Medicare Other | Source: Ambulatory Visit | Attending: Radiation Oncology | Admitting: Radiation Oncology

## 2022-04-11 DIAGNOSIS — M47812 Spondylosis without myelopathy or radiculopathy, cervical region: Secondary | ICD-10-CM | POA: Diagnosis not present

## 2022-04-11 DIAGNOSIS — K5900 Constipation, unspecified: Secondary | ICD-10-CM | POA: Diagnosis not present

## 2022-04-11 DIAGNOSIS — C778 Secondary and unspecified malignant neoplasm of lymph nodes of multiple regions: Secondary | ICD-10-CM | POA: Diagnosis not present

## 2022-04-11 DIAGNOSIS — Z510 Encounter for antineoplastic radiation therapy: Secondary | ICD-10-CM | POA: Diagnosis not present

## 2022-04-11 DIAGNOSIS — Z5111 Encounter for antineoplastic chemotherapy: Secondary | ICD-10-CM | POA: Diagnosis not present

## 2022-04-11 DIAGNOSIS — Z87891 Personal history of nicotine dependence: Secondary | ICD-10-CM | POA: Diagnosis not present

## 2022-04-11 DIAGNOSIS — R590 Localized enlarged lymph nodes: Secondary | ICD-10-CM | POA: Diagnosis not present

## 2022-04-11 DIAGNOSIS — Z79899 Other long term (current) drug therapy: Secondary | ICD-10-CM | POA: Diagnosis not present

## 2022-04-11 DIAGNOSIS — C3412 Malignant neoplasm of upper lobe, left bronchus or lung: Secondary | ICD-10-CM | POA: Diagnosis not present

## 2022-04-11 LAB — RAD ONC ARIA SESSION SUMMARY
Course Elapsed Days: 37
Plan Fractions Treated to Date: 28
Plan Prescribed Dose Per Fraction: 2 Gy
Plan Total Fractions Prescribed: 33
Plan Total Prescribed Dose: 66 Gy
Reference Point Dosage Given to Date: 56 Gy
Reference Point Session Dosage Given: 2 Gy
Session Number: 28

## 2022-04-12 ENCOUNTER — Other Ambulatory Visit: Payer: Self-pay

## 2022-04-12 ENCOUNTER — Ambulatory Visit
Admission: RE | Admit: 2022-04-12 | Discharge: 2022-04-12 | Disposition: A | Discharge status: Home / Self Care | Payer: Medicare Other | Source: Ambulatory Visit | Attending: Radiation Oncology | Admitting: Radiation Oncology

## 2022-04-12 DIAGNOSIS — Z87891 Personal history of nicotine dependence: Secondary | ICD-10-CM | POA: Diagnosis not present

## 2022-04-12 DIAGNOSIS — C3412 Malignant neoplasm of upper lobe, left bronchus or lung: Secondary | ICD-10-CM | POA: Diagnosis not present

## 2022-04-12 DIAGNOSIS — R590 Localized enlarged lymph nodes: Secondary | ICD-10-CM | POA: Diagnosis not present

## 2022-04-12 DIAGNOSIS — K5900 Constipation, unspecified: Secondary | ICD-10-CM | POA: Diagnosis not present

## 2022-04-12 DIAGNOSIS — Z5111 Encounter for antineoplastic chemotherapy: Secondary | ICD-10-CM | POA: Diagnosis not present

## 2022-04-12 DIAGNOSIS — C778 Secondary and unspecified malignant neoplasm of lymph nodes of multiple regions: Secondary | ICD-10-CM | POA: Diagnosis not present

## 2022-04-12 DIAGNOSIS — M47812 Spondylosis without myelopathy or radiculopathy, cervical region: Secondary | ICD-10-CM | POA: Diagnosis not present

## 2022-04-12 DIAGNOSIS — Z510 Encounter for antineoplastic radiation therapy: Secondary | ICD-10-CM | POA: Diagnosis not present

## 2022-04-12 DIAGNOSIS — Z79899 Other long term (current) drug therapy: Secondary | ICD-10-CM | POA: Diagnosis not present

## 2022-04-12 LAB — RAD ONC ARIA SESSION SUMMARY
Course Elapsed Days: 38
Plan Fractions Treated to Date: 29
Plan Prescribed Dose Per Fraction: 2 Gy
Plan Total Fractions Prescribed: 33
Plan Total Prescribed Dose: 66 Gy
Reference Point Dosage Given to Date: 58 Gy
Reference Point Session Dosage Given: 2 Gy
Session Number: 29

## 2022-04-12 MED FILL — Dexamethasone Sodium Phosphate Inj 100 MG/10ML: INTRAMUSCULAR | Qty: 1 | Status: AC

## 2022-04-13 ENCOUNTER — Other Ambulatory Visit: Payer: Self-pay

## 2022-04-14 ENCOUNTER — Ambulatory Visit
Admission: RE | Admit: 2022-04-14 | Discharge: 2022-04-14 | Disposition: A | Discharge status: Home / Self Care | Payer: Medicare Other | Source: Ambulatory Visit | Attending: Radiation Oncology | Admitting: Radiation Oncology

## 2022-04-14 ENCOUNTER — Other Ambulatory Visit: Payer: Self-pay

## 2022-04-14 DIAGNOSIS — R590 Localized enlarged lymph nodes: Secondary | ICD-10-CM | POA: Diagnosis not present

## 2022-04-14 DIAGNOSIS — Z79899 Other long term (current) drug therapy: Secondary | ICD-10-CM | POA: Diagnosis not present

## 2022-04-14 DIAGNOSIS — Z5111 Encounter for antineoplastic chemotherapy: Secondary | ICD-10-CM | POA: Diagnosis not present

## 2022-04-14 DIAGNOSIS — K5900 Constipation, unspecified: Secondary | ICD-10-CM | POA: Diagnosis not present

## 2022-04-14 DIAGNOSIS — C778 Secondary and unspecified malignant neoplasm of lymph nodes of multiple regions: Secondary | ICD-10-CM | POA: Diagnosis not present

## 2022-04-14 DIAGNOSIS — Z87891 Personal history of nicotine dependence: Secondary | ICD-10-CM | POA: Diagnosis not present

## 2022-04-14 DIAGNOSIS — Z510 Encounter for antineoplastic radiation therapy: Secondary | ICD-10-CM | POA: Diagnosis not present

## 2022-04-14 DIAGNOSIS — C3412 Malignant neoplasm of upper lobe, left bronchus or lung: Secondary | ICD-10-CM | POA: Diagnosis not present

## 2022-04-14 DIAGNOSIS — M47812 Spondylosis without myelopathy or radiculopathy, cervical region: Secondary | ICD-10-CM | POA: Diagnosis not present

## 2022-04-14 LAB — RAD ONC ARIA SESSION SUMMARY
Course Elapsed Days: 40
Plan Fractions Treated to Date: 30
Plan Prescribed Dose Per Fraction: 2 Gy
Plan Total Fractions Prescribed: 33
Plan Total Prescribed Dose: 66 Gy
Reference Point Dosage Given to Date: 60 Gy
Reference Point Session Dosage Given: 2 Gy
Session Number: 30

## 2022-04-15 ENCOUNTER — Other Ambulatory Visit: Payer: Self-pay

## 2022-04-15 ENCOUNTER — Ambulatory Visit: Payer: Medicare Other

## 2022-04-15 ENCOUNTER — Inpatient Hospital Stay: Payer: Medicare Other

## 2022-04-15 ENCOUNTER — Ambulatory Visit
Admission: RE | Admit: 2022-04-15 | Discharge: 2022-04-15 | Disposition: A | Discharge status: Home / Self Care | Payer: Medicare Other | Source: Ambulatory Visit | Attending: Radiation Oncology | Admitting: Radiation Oncology

## 2022-04-15 VITALS — BP 119/76 | HR 70 | Temp 98.0°F | Resp 16 | Wt 174.8 lb

## 2022-04-15 DIAGNOSIS — R590 Localized enlarged lymph nodes: Secondary | ICD-10-CM | POA: Diagnosis not present

## 2022-04-15 DIAGNOSIS — C778 Secondary and unspecified malignant neoplasm of lymph nodes of multiple regions: Secondary | ICD-10-CM | POA: Diagnosis not present

## 2022-04-15 DIAGNOSIS — M47812 Spondylosis without myelopathy or radiculopathy, cervical region: Secondary | ICD-10-CM | POA: Diagnosis not present

## 2022-04-15 DIAGNOSIS — Z5111 Encounter for antineoplastic chemotherapy: Secondary | ICD-10-CM | POA: Diagnosis not present

## 2022-04-15 DIAGNOSIS — Z79899 Other long term (current) drug therapy: Secondary | ICD-10-CM | POA: Diagnosis not present

## 2022-04-15 DIAGNOSIS — Z87891 Personal history of nicotine dependence: Secondary | ICD-10-CM | POA: Diagnosis not present

## 2022-04-15 DIAGNOSIS — K5900 Constipation, unspecified: Secondary | ICD-10-CM | POA: Diagnosis not present

## 2022-04-15 DIAGNOSIS — C3412 Malignant neoplasm of upper lobe, left bronchus or lung: Secondary | ICD-10-CM | POA: Diagnosis not present

## 2022-04-15 DIAGNOSIS — C3492 Malignant neoplasm of unspecified part of left bronchus or lung: Secondary | ICD-10-CM

## 2022-04-15 DIAGNOSIS — Z510 Encounter for antineoplastic radiation therapy: Secondary | ICD-10-CM | POA: Diagnosis not present

## 2022-04-15 LAB — CBC WITH DIFFERENTIAL (CANCER CENTER ONLY)
Abs Immature Granulocytes: 0.01 10*3/uL (ref 0.00–0.07)
Basophils Absolute: 0 10*3/uL (ref 0.0–0.1)
Basophils Relative: 1 %
Eosinophils Absolute: 0.1 10*3/uL (ref 0.0–0.5)
Eosinophils Relative: 3 %
HCT: 31.2 % — ABNORMAL LOW (ref 36.0–46.0)
Hemoglobin: 10.5 g/dL — ABNORMAL LOW (ref 12.0–15.0)
Immature Granulocytes: 1 %
Lymphocytes Relative: 20 %
Lymphs Abs: 0.4 10*3/uL — ABNORMAL LOW (ref 0.7–4.0)
MCH: 29.2 pg (ref 26.0–34.0)
MCHC: 33.7 g/dL (ref 30.0–36.0)
MCV: 86.9 fL (ref 80.0–100.0)
Monocytes Absolute: 0.2 10*3/uL (ref 0.1–1.0)
Monocytes Relative: 10 %
Neutro Abs: 1.4 10*3/uL — ABNORMAL LOW (ref 1.7–7.7)
Neutrophils Relative %: 65 %
Platelet Count: 147 10*3/uL — ABNORMAL LOW (ref 150–400)
RBC: 3.59 MIL/uL — ABNORMAL LOW (ref 3.87–5.11)
RDW: 15.3 % (ref 11.5–15.5)
WBC Count: 2.1 10*3/uL — ABNORMAL LOW (ref 4.0–10.5)
nRBC: 0 % (ref 0.0–0.2)

## 2022-04-15 LAB — RAD ONC ARIA SESSION SUMMARY
Course Elapsed Days: 41
Plan Fractions Treated to Date: 31
Plan Prescribed Dose Per Fraction: 2 Gy
Plan Total Fractions Prescribed: 33
Plan Total Prescribed Dose: 66 Gy
Reference Point Dosage Given to Date: 62 Gy
Reference Point Session Dosage Given: 2 Gy
Session Number: 31

## 2022-04-15 LAB — CMP (CANCER CENTER ONLY)
ALT: 10 U/L (ref 0–44)
AST: 11 U/L — ABNORMAL LOW (ref 15–41)
Albumin: 4.3 g/dL (ref 3.5–5.0)
Alkaline Phosphatase: 56 U/L (ref 38–126)
Anion gap: 6 (ref 5–15)
BUN: 29 mg/dL — ABNORMAL HIGH (ref 8–23)
CO2: 24 mmol/L (ref 22–32)
Calcium: 9.5 mg/dL (ref 8.9–10.3)
Chloride: 108 mmol/L (ref 98–111)
Creatinine: 1.09 mg/dL — ABNORMAL HIGH (ref 0.44–1.00)
GFR, Estimated: 53 mL/min — ABNORMAL LOW (ref >60)
Glucose, Bld: 94 mg/dL (ref 70–99)
Potassium: 4.8 mmol/L (ref 3.5–5.1)
Sodium: 138 mmol/L (ref 135–145)
Total Bilirubin: 0.4 mg/dL (ref 0.3–1.2)
Total Protein: 7.5 g/dL (ref 6.5–8.1)

## 2022-04-15 MED ORDER — FAMOTIDINE IN NACL 20-0.9 MG/50ML-% IV SOLN
20.0000 mg | Freq: Once | INTRAVENOUS | Status: AC
  Administered 2022-04-15: 20 mg via INTRAVENOUS
  Filled 2022-04-15: qty 50

## 2022-04-15 MED ORDER — SODIUM CHLORIDE 0.9 % IV SOLN
10.0000 mg | Freq: Once | INTRAVENOUS | Status: AC
  Administered 2022-04-15: 10 mg via INTRAVENOUS
  Filled 2022-04-15: qty 10

## 2022-04-15 MED ORDER — PALONOSETRON HCL INJECTION 0.25 MG/5ML
0.2500 mg | Freq: Once | INTRAVENOUS | Status: AC
  Administered 2022-04-15: 0.25 mg via INTRAVENOUS
  Filled 2022-04-15: qty 5

## 2022-04-15 MED ORDER — CETIRIZINE HCL 10 MG/ML IV SOLN
10.0000 mg | Freq: Once | INTRAVENOUS | Status: AC
  Administered 2022-04-15: 10 mg via INTRAVENOUS
  Filled 2022-04-15: qty 1

## 2022-04-15 MED ORDER — SODIUM CHLORIDE 0.9 % IV SOLN
147.2000 mg | Freq: Once | INTRAVENOUS | Status: AC
  Administered 2022-04-15: 150 mg via INTRAVENOUS
  Filled 2022-04-15: qty 15

## 2022-04-15 MED ORDER — SODIUM CHLORIDE 0.9 % IV SOLN: Freq: Once | INTRAVENOUS | Status: AC

## 2022-04-15 MED ORDER — SODIUM CHLORIDE 0.9 % IV SOLN
45.0000 mg/m2 | Freq: Once | INTRAVENOUS | Status: AC
  Administered 2022-04-15: 84 mg via INTRAVENOUS
  Filled 2022-04-15: qty 14

## 2022-04-15 NOTE — Patient Instructions (Signed)
[facility]  Discharge Instructions: Thank you for choosing [facility] to provide your oncology and hematology care.   If you have a lab appointment with the [facility], please go directly to the [facility] and check in at the registration area.   Wear comfortable clothing and clothing appropriate for easy access to any Portacath or PICC line.   We strive to give you quality time with your provider. You may need to reschedule your appointment if you arrive late (15 or more minutes).  Arriving late affects you and other patients whose appointments are after yours.  Also, if you miss three or more appointments without notifying the office, you may be dismissed from the clinic at the provider's discretion.      For prescription refill requests, have your pharmacy contact our office and allow 72 hours for refills to be completed.    Today you received the following chemotherapy and/or immunotherapy agents paclitaxel, carboplatin      To help prevent nausea and vomiting after your treatment, we encourage you to take your nausea medication as directed.  BELOW ARE SYMPTOMS THAT SHOULD BE REPORTED IMMEDIATELY: *FEVER GREATER THAN 100.4 F (38 C) OR HIGHER *CHILLS OR SWEATING *NAUSEA AND VOMITING THAT IS NOT CONTROLLED WITH YOUR NAUSEA MEDICATION *UNUSUAL SHORTNESS OF BREATH *UNUSUAL BRUISING OR BLEEDING *URINARY PROBLEMS (pain or burning when urinating, or frequent urination) *BOWEL PROBLEMS (unusual diarrhea, constipation, pain near the anus) TENDERNESS IN MOUTH AND THROAT WITH OR WITHOUT PRESENCE OF ULCERS (sore throat, sores in mouth, or a toothache) UNUSUAL RASH, SWELLING OR PAIN  UNUSUAL VAGINAL DISCHARGE OR ITCHING   Items with * indicate a potential emergency and should be followed up as soon as possible or go to the Emergency Department if any problems should occur.  Please show the CHEMOTHERAPY ALERT CARD or IMMUNOTHERAPY ALERT CARD at  check-in to the Emergency Department and triage nurse.  Should you have questions after your visit or need to cancel or reschedule your appointment, please contact [facility]  Dept: [phone]  and follow the prompts.  Office hours are 8:00 a.m. to 4:30 p.m. Monday - Friday. Please note that voicemails left after 4:00 p.m. may not be returned until the following business day.  We are closed weekends and major holidays. You have access to a nurse at all times for urgent questions. Please call the main number to the clinic Dept: [phone] and follow the prompts.   For any non-urgent questions, you may also contact your provider using MyChart. We now offer e-Visits for anyone 57 and older to request care online for non-urgent symptoms. For details visit [email or web address].   Also download the MyChart app! Go to the app store, search "MyChart", open the app, select [facility], and log in with your MyChart username and password.  Masks are optional in the cancer centers. If you would like for your care team to wear a mask while they are taking care of you, please let them know. You may have one support person who is at least 75 years old accompany you for your appointments.

## 2022-04-15 NOTE — Progress Notes (Signed)
Per Dr. Julien Nordmann, ok to treat with low ANC

## 2022-04-16 ENCOUNTER — Other Ambulatory Visit: Payer: Self-pay

## 2022-04-16 ENCOUNTER — Ambulatory Visit
Admission: RE | Admit: 2022-04-16 | Discharge: 2022-04-16 | Disposition: A | Discharge status: Home / Self Care | Payer: Medicare Other | Source: Ambulatory Visit | Attending: Radiation Oncology | Admitting: Radiation Oncology

## 2022-04-16 DIAGNOSIS — R590 Localized enlarged lymph nodes: Secondary | ICD-10-CM | POA: Diagnosis not present

## 2022-04-16 DIAGNOSIS — Z5111 Encounter for antineoplastic chemotherapy: Secondary | ICD-10-CM | POA: Diagnosis not present

## 2022-04-16 DIAGNOSIS — M47812 Spondylosis without myelopathy or radiculopathy, cervical region: Secondary | ICD-10-CM | POA: Diagnosis not present

## 2022-04-16 DIAGNOSIS — Z510 Encounter for antineoplastic radiation therapy: Secondary | ICD-10-CM | POA: Diagnosis not present

## 2022-04-16 DIAGNOSIS — C3412 Malignant neoplasm of upper lobe, left bronchus or lung: Secondary | ICD-10-CM | POA: Diagnosis not present

## 2022-04-16 DIAGNOSIS — Z79899 Other long term (current) drug therapy: Secondary | ICD-10-CM | POA: Diagnosis not present

## 2022-04-16 DIAGNOSIS — K5900 Constipation, unspecified: Secondary | ICD-10-CM | POA: Diagnosis not present

## 2022-04-16 DIAGNOSIS — Z87891 Personal history of nicotine dependence: Secondary | ICD-10-CM | POA: Diagnosis not present

## 2022-04-16 DIAGNOSIS — C778 Secondary and unspecified malignant neoplasm of lymph nodes of multiple regions: Secondary | ICD-10-CM | POA: Diagnosis not present

## 2022-04-16 LAB — RAD ONC ARIA SESSION SUMMARY
Course Elapsed Days: 42
Plan Fractions Treated to Date: 32
Plan Prescribed Dose Per Fraction: 2 Gy
Plan Total Fractions Prescribed: 33
Plan Total Prescribed Dose: 66 Gy
Reference Point Dosage Given to Date: 64 Gy
Reference Point Session Dosage Given: 2 Gy
Session Number: 32

## 2022-04-17 ENCOUNTER — Ambulatory Visit
Admission: RE | Admit: 2022-04-17 | Discharge: 2022-04-17 | Disposition: A | Discharge status: Home / Self Care | Payer: Medicare Other | Source: Ambulatory Visit | Attending: Radiation Oncology | Admitting: Radiation Oncology

## 2022-04-17 ENCOUNTER — Other Ambulatory Visit: Payer: Self-pay

## 2022-04-17 ENCOUNTER — Encounter: Payer: Self-pay | Admitting: Radiation Oncology

## 2022-04-17 ENCOUNTER — Ambulatory Visit: Payer: Medicare Other

## 2022-04-17 DIAGNOSIS — C778 Secondary and unspecified malignant neoplasm of lymph nodes of multiple regions: Secondary | ICD-10-CM | POA: Diagnosis not present

## 2022-04-17 DIAGNOSIS — R590 Localized enlarged lymph nodes: Secondary | ICD-10-CM | POA: Diagnosis not present

## 2022-04-17 DIAGNOSIS — Z510 Encounter for antineoplastic radiation therapy: Secondary | ICD-10-CM | POA: Diagnosis not present

## 2022-04-17 DIAGNOSIS — C3412 Malignant neoplasm of upper lobe, left bronchus or lung: Secondary | ICD-10-CM | POA: Diagnosis not present

## 2022-04-17 DIAGNOSIS — K5900 Constipation, unspecified: Secondary | ICD-10-CM | POA: Diagnosis not present

## 2022-04-17 DIAGNOSIS — Z79899 Other long term (current) drug therapy: Secondary | ICD-10-CM | POA: Diagnosis not present

## 2022-04-17 DIAGNOSIS — M47812 Spondylosis without myelopathy or radiculopathy, cervical region: Secondary | ICD-10-CM | POA: Diagnosis not present

## 2022-04-17 DIAGNOSIS — Z5111 Encounter for antineoplastic chemotherapy: Secondary | ICD-10-CM | POA: Diagnosis not present

## 2022-04-17 DIAGNOSIS — Z87891 Personal history of nicotine dependence: Secondary | ICD-10-CM | POA: Diagnosis not present

## 2022-04-17 LAB — RAD ONC ARIA SESSION SUMMARY
Course Elapsed Days: 43
Plan Fractions Treated to Date: 33
Plan Prescribed Dose Per Fraction: 2 Gy
Plan Total Fractions Prescribed: 33
Plan Total Prescribed Dose: 66 Gy
Reference Point Dosage Given to Date: 66 Gy
Reference Point Session Dosage Given: 2 Gy
Session Number: 33

## 2022-04-19 ENCOUNTER — Ambulatory Visit (HOSPITAL_COMMUNITY)
Admission: RE | Admit: 2022-04-19 | Discharge: 2022-04-19 | Disposition: A | Discharge status: Home / Self Care | Payer: Medicare Other | Source: Ambulatory Visit | Attending: Physician Assistant | Admitting: Physician Assistant

## 2022-04-19 ENCOUNTER — Telehealth: Payer: Self-pay | Admitting: Hematology and Oncology

## 2022-04-19 ENCOUNTER — Other Ambulatory Visit: Payer: Self-pay | Admitting: Hematology and Oncology

## 2022-04-19 DIAGNOSIS — C3412 Malignant neoplasm of upper lobe, left bronchus or lung: Secondary | ICD-10-CM | POA: Insufficient documentation

## 2022-04-19 DIAGNOSIS — R911 Solitary pulmonary nodule: Secondary | ICD-10-CM | POA: Diagnosis not present

## 2022-04-19 DIAGNOSIS — C3490 Malignant neoplasm of unspecified part of unspecified bronchus or lung: Secondary | ICD-10-CM | POA: Diagnosis not present

## 2022-04-19 MED ORDER — APIXABAN (ELIQUIS) VTE STARTER PACK (10MG AND 5MG): ORAL_TABLET | ORAL | 0 refills | Status: DC

## 2022-04-19 MED ORDER — IOHEXOL 300 MG/ML  SOLN
75.0000 mL | Freq: Once | INTRAMUSCULAR | Status: AC | PRN
  Administered 2022-04-19: 75 mL via INTRAVENOUS

## 2022-04-19 NOTE — Telephone Encounter (Signed)
Called Ms. Kunin to discuss results of her CT scan.  Unfortunately she was noted to have a subsegmental/segmental pulmonary embolism.  Will need to start on anticoagulation therapy.  Called in Eliquis starter pack to her [employer].  Left message requesting callback to discuss results.  Ledell Peoples, MD Department of Hematology/Oncology [facility] Phone: [phone] Pager: [phone] Email: [email or web address]

## 2022-04-22 ENCOUNTER — Telehealth: Payer: Self-pay

## 2022-04-22 ENCOUNTER — Ambulatory Visit: Payer: Medicare Other

## 2022-04-22 NOTE — Telephone Encounter (Signed)
This nurse reached out to patient and made her aware that her CT scan shows a Pulmonary Embolism.  Advised that the provider has called in an Eliquis starter pack.  Also advised patient she got her scan and it showed a small PE. Advised that medication was called in on 11/24 and she needs to start right away. Patient knows once she get close to completing this prescription, she needs to call us back for a refill of the maintenance pack. This will likely be an ongoing medication given her blood clot in the setting of cancer. Advised not take any NSAIDs such as ibuprofen, advil, or Alleve while taking a blood thinner. If she develops any concerning bleeding then she needs to let us know. Her PE was small, but if she develops any significant shortness of breath, chest pain, lightheadedness, etc, she would need to seek emergency evaluation.  Patient acknowledged understanding.  No further questions or concerns at this time.

## 2022-04-23 ENCOUNTER — Encounter: Payer: Self-pay | Admitting: Internal Medicine

## 2022-04-23 ENCOUNTER — Telehealth: Payer: Self-pay | Admitting: Medical Oncology

## 2022-04-23 NOTE — Telephone Encounter (Signed)
Pt requesting results of CT. Next appt is dec 18.

## 2022-04-23 NOTE — Telephone Encounter (Signed)
I called Yolanda Robertson and she wanted the results of her CT scan. Her appt with Julien Nordmann is dec 16th. I sent schedule message to move up her appt to next Thursday.

## 2022-04-25 DIAGNOSIS — E7849 Other hyperlipidemia: Secondary | ICD-10-CM | POA: Diagnosis not present

## 2022-04-25 DIAGNOSIS — I10 Essential (primary) hypertension: Secondary | ICD-10-CM | POA: Diagnosis not present

## 2022-04-30 ENCOUNTER — Telehealth: Payer: Self-pay | Admitting: Internal Medicine

## 2022-04-30 NOTE — Telephone Encounter (Signed)
Called patient regarding upcoming December appointments, patient is notified. 

## 2022-05-02 ENCOUNTER — Other Ambulatory Visit: Payer: Self-pay

## 2022-05-02 ENCOUNTER — Inpatient Hospital Stay: Payer: Medicare Other | Attending: Internal Medicine | Admitting: Internal Medicine

## 2022-05-02 VITALS — BP 126/72 | HR 76 | Temp 98.2°F | Resp 16 | Wt 176.3 lb

## 2022-05-02 DIAGNOSIS — Z5111 Encounter for antineoplastic chemotherapy: Secondary | ICD-10-CM | POA: Insufficient documentation

## 2022-05-02 DIAGNOSIS — Z86711 Personal history of pulmonary embolism: Secondary | ICD-10-CM | POA: Insufficient documentation

## 2022-05-02 DIAGNOSIS — I700 Atherosclerosis of aorta: Secondary | ICD-10-CM | POA: Diagnosis not present

## 2022-05-02 DIAGNOSIS — Z7901 Long term (current) use of anticoagulants: Secondary | ICD-10-CM | POA: Diagnosis not present

## 2022-05-02 DIAGNOSIS — J984 Other disorders of lung: Secondary | ICD-10-CM | POA: Diagnosis not present

## 2022-05-02 DIAGNOSIS — Z510 Encounter for antineoplastic radiation therapy: Secondary | ICD-10-CM | POA: Diagnosis present

## 2022-05-02 DIAGNOSIS — I2693 Single subsegmental pulmonary embolism without acute cor pulmonale: Secondary | ICD-10-CM | POA: Insufficient documentation

## 2022-05-02 DIAGNOSIS — R590 Localized enlarged lymph nodes: Secondary | ICD-10-CM | POA: Insufficient documentation

## 2022-05-02 DIAGNOSIS — R5383 Other fatigue: Secondary | ICD-10-CM | POA: Diagnosis not present

## 2022-05-02 DIAGNOSIS — C3412 Malignant neoplasm of upper lobe, left bronchus or lung: Secondary | ICD-10-CM | POA: Insufficient documentation

## 2022-05-02 DIAGNOSIS — C3492 Malignant neoplasm of unspecified part of left bronchus or lung: Secondary | ICD-10-CM | POA: Diagnosis not present

## 2022-05-02 DIAGNOSIS — Z95828 Presence of other vascular implants and grafts: Secondary | ICD-10-CM

## 2022-05-02 NOTE — Progress Notes (Signed)
[facility] [phone]   [phone]  OFFICE PROGRESS NOTE  [name], [address] [place] [address]  DIAGNOSIS:  1) Stage IIIb (T1a, N3, M0) non-small cell lung cancer, adenosquamous carcinoma presented with left upper lobe pulmonary nodule in addition to left hilar, mediastinal and left supraclavicular and cervical lymphadenopathy diagnosed in September 2023. 2) incidental pulmonary embolism of segmental to subsegmental nonocclusive pulmonary embolism in the peripheral right lower lobe diagnosed in November 2023.  Detected Alteration(s) / Biomarker(s) Associated FDA-approved therapies Clinical Trial Availability % cfDNA or Amplification  TP53 [id number] None Yes 1.6%  NFE2L2 D29V None Yes 1.2%  TP53 V216M None Yes 0.4%  TMB 18.02 mut/Mb  PDL1 Expression: 80%  PRIOR THERAPY: A course of concurrent chemoradiation with weekly carboplatin for AUC of 2 and paclitaxel 45 Mg/M2.  First dose March 11, 2022.  Status post 7 cycles of treatment last dose was given in April 15, 2022.   CURRENT THERAPY:  1) Consolidation treatment with immunotherapy with Imfinzi 1500 mg IV every 4 weeks.  First dose May 09, 2022. 2) Eliquis starter kit with 10 mg p.o. twice daily and then 5 mg p.o. twice daily after that.  INTERVAL HISTORY: Yolanda Robertson 76 y.o. female returns to the clinic today for follow-up visit accompanied by her son.  The patient is feeling fine today with no concerning complaints except for fatigue.  She was found recently on CT scan of the chest to have incidental small pulmonary embolism and she started treatment with Eliquis.  The patient denied having any current chest pain, shortness of breath, cough or hemoptysis.  She has no nausea, vomiting, diarrhea or constipation.  She has no headache or visual changes.  She denied having any weight loss or night sweats.  She tolerated the previous course of concurrent  chemoradiation fairly well.  She is here today for evaluation with repeat CT scan of the chest for restaging of her disease. [name]  MEDICAL HISTORY: Past Medical History:  Diagnosis Date   Hyperlipidemia    Hypertension    Lung mass    left upper lobe    ALLERGIES:  has no allergies on file.  MEDICATIONS:  Current Outpatient Medications  Medication Sig Dispense Refill   APIXABAN (ELIQUIS) VTE STARTER PACK (10MG AND 5MG) Take as directed on package: start with two-23m tablets twice daily for 7 days. On day 8, switch to one-510mtablet twice daily. 1 each 0   benzonatate (TESSALON) 200 MG capsule Take 1 capsule (200 mg total) by mouth 3 (three) times daily as needed for cough. 30 capsule 1   ibuprofen (ADVIL) 200 MG tablet Take 200 mg by mouth every 8 (eight) hours as needed (pain.).     indapamide (LOZOL) 1.25 MG tablet Take 1.25 mg by mouth in the morning.     lidocaine (XYLOCAINE) 2 % solution Patient: Mix 1part 2% viscous lidocaine, 1part H20. Swallow 1022mf diluted mixture, 53m43mefore meals and at bedtime, up to QID prn soreness 200 mL 3   Polyvinyl Alcohol-Povidone PF (REFRESH) 1.4-0.6 % SOLN Place 1-2 drops into both eyes 3 (three) times daily as needed (dry/irritated eyes.).     prochlorperazine (COMPAZINE) 10 MG tablet Take 1 tablet (10 mg total) by mouth every 6 (six) hours as needed for nausea or vomiting. 30 tablet 0   rosuvastatin (CRESTOR) 10 MG tablet Take 10 mg by mouth in the morning.  No current facility-administered medications for this visit.    SURGICAL HISTORY:  Past Surgical History:  Procedure Laterality Date   FINE NEEDLE ASPIRATION  02/12/2022   Procedure: FINE NEEDLE ASPIRATION (FNA) LINEAR;  Surgeon: Garner Nash, DO;  Location: [facility];  Service: Pulmonary;;   VAGINAL HYSTERECTOMY     VIDEO BRONCHOSCOPY WITH ENDOBRONCHIAL ULTRASOUND Left 02/12/2022   Procedure: VIDEO BRONCHOSCOPY WITH ENDOBRONCHIAL ULTRASOUND;  Surgeon: Garner Nash, DO;   Location: [facility];  Service: Pulmonary;  Laterality: Left;    REVIEW OF SYSTEMS:  Constitutional: positive for fatigue Eyes: negative Ears, nose, mouth, throat, and face: negative Respiratory: negative Cardiovascular: negative Gastrointestinal: negative Genitourinary:negative Integument/breast: negative Hematologic/lymphatic: negative Musculoskeletal:negative Neurological: negative Behavioral/Psych: negative Endocrine: negative Allergic/Immunologic: negative   PHYSICAL EXAMINATION: General appearance: alert, cooperative, and no distress Head: Normocephalic, without obvious abnormality, atraumatic Neck: no adenopathy, no JVD, supple, symmetrical, trachea midline, and thyroid not enlarged, symmetric, no tenderness/mass/nodules Lymph nodes: Cervical, supraclavicular, and axillary nodes normal. Resp: clear to auscultation bilaterally Back: symmetric, no curvature. ROM normal. No CVA tenderness. Cardio: regular rate and rhythm, S1, S2 normal, no murmur, click, rub or gallop GI: soft, non-tender; bowel sounds normal; no masses,  no organomegaly Extremities: extremities normal, atraumatic, no cyanosis or edema Neurologic: Alert and oriented X 3, normal strength and tone. Normal symmetric reflexes. Normal coordination and gait  ECOG PERFORMANCE STATUS: 1 - Symptomatic but completely ambulatory  Blood pressure 126/72, pulse 76, temperature 98.2 F (36.8 C), temperature source Oral, resp. rate 16, weight 176 lb 5 oz (80 kg), SpO2 100 %.  LABORATORY DATA: Lab Results  Component Value Date   WBC 2.1 (L) 04/15/2022   HGB 10.5 (L) 04/15/2022   HCT 31.2 (L) 04/15/2022   MCV 86.9 04/15/2022   PLT 147 (L) 04/15/2022      Chemistry      Component Value Date/Time   NA 138 04/15/2022 1023   K 4.8 04/15/2022 1023   CL 108 04/15/2022 1023   CO2 24 04/15/2022 1023   BUN 29 (H) 04/15/2022 1023   CREATININE 1.09 (H) 04/15/2022 1023      Component Value Date/Time   CALCIUM 9.5  04/15/2022 1023   ALKPHOS 56 04/15/2022 1023   AST 11 (L) 04/15/2022 1023   ALT 10 04/15/2022 1023   BILITOT 0.4 04/15/2022 1023       RADIOGRAPHIC STUDIES: CT Chest W Contrast  Addendum Date: 04/19/2022   ADDENDUM REPORT: 04/19/2022 16:09 ADDENDUM: Critical finding of incidental pulmonary embolism was reported to triage nurse Kris Hartmann RN after page to on-call provider was not returned. Electronically Signed   By: Delanna Ahmadi M.D.   On: 04/19/2022 16:09   Result Date: 04/19/2022 CLINICAL DATA:  Metastatic non-small cell lung cancer, assess treatment response * Tracking Code: BO * EXAM: CT CHEST WITH CONTRAST TECHNIQUE: Multidetector CT imaging of the chest was performed during intravenous contrast administration. RADIATION DOSE REDUCTION: This exam was performed according to the departmental dose-optimization program which includes automated exposure control, adjustment of the mA and/or kV according to patient size and/or use of iterative reconstruction technique. CONTRAST:  6m OMNIPAQUE IOHEXOL 300 MG/ML  SOLN COMPARISON:  PET-CT, 02/01/2022 FINDINGS: Cardiovascular: Aortic atherosclerosis. Normal heart size. No pericardial effusion. Incidental note of segmental to subsegmental, nonocclusive pulmonary embolus in the peripheral right lower lobe (series 2, image 93). Mediastinum/Nodes: Significant interval decrease in size of previously seen large, FDG avid AP window lymph node, measuring 2.3 x 1.7 cm, previously 4.5 x 2.6 cm (  series 2, image 55) and a left hilar lymph node measuring 1.7 x 1.4 cm, previously 3.6 x 3.4 cm (series 2, image 70) thyroid gland, trachea, and esophagus demonstrate no significant findings. Lungs/Pleura: Spiculated, previously FDG avid nodule of the medial anterior left upper lobe is almost completely resolved, an irregular residual in this vicinity measuring no greater than 0.6 cm, previously 1.0 x 1.0 cm (series 5, image 57). Bland appearing, bandlike scarring of  the lungs. No pleural effusion or pneumothorax. Upper Abdomen: No acute abnormality. Unchanged, macroscopic fat containing, not previously FDG avid definitively benign right adrenal adenoma measuring 3.1 x 2.4 cm (series 2, image 140). Musculoskeletal: No chest wall abnormality. No acute osseous findings. IMPRESSION: 1. Spiculated, previously FDG avid nodule of the medial anterior left upper lobe is almost completely resolved, only an irregular residual remaining in this vicinity. 2. Significant interval decrease in size of previously seen large, FDG avid AP window lymph node and a left hilar lymph node. 3. Findings are consistent with treatment response of primary lung malignancy and nodal metastatic disease. 4. Incidental note of segmental to subsegmental, nonocclusive pulmonary embolus in the peripheral right lower lobe. Stat call report request was placed at the time of interpretation. Report will be addended with communication information when critical results are communicated. Aortic Atherosclerosis (ICD10-I70.0). Electronically Signed: By: Delanna Ahmadi M.D. On: 04/19/2022 15:37    ASSESSMENT AND PLAN: This is a very pleasant 76 years old African-American female with Stage IIIb (T1a, N3, M0) non-small cell lung cancer, adenosquamous carcinoma presented with left upper lobe pulmonary nodule in addition to left hilar, mediastinal and left supraclavicular and cervical lymphadenopathy diagnosed in September 2023. Molecular studies showed no actionable mutations and PD-L1 expression is 80%. The patient also had bronchoscopy by Dr. Valeta Harms and the final pathology was consistent with adenosquamous carcinoma from the left upper lobe lung nodule. 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 The patient underwent a course of concurrent chemoradiation with weekly carboplatin for AUC of 2 and paclitaxel 45 Mg/M2.  First  dose March 11, 2022.  Status post 7 cycles the last dose was given April 15, 2022. The patient had a repeat CT scan of the chest performed recently.  I personally and independently reviewed the scan and discussed the results with the patient and her son today. Her scan showed decrease and almost resolution of the hypermetabolic nodule of the left upper lobe and also improvement in the mediastinal lymphadenopathy. I discussed with the patient the option of treatment with consolidation immunotherapy with Imfinzi 1500 Mg IV every 4 weeks for 1 year.  I discussed with the patient the benefit of the consolidation treatment versus observation and close monitoring. She is interested in proceeding with the treatment.  I reminded her of the adverse effect of the immunotherapy including but not limited to skin rash, diarrhea, inflammation of the lung, kidney, liver, thyroid or other endocrine dysfunction including type 1 diabetes mellitus. The patient would like to proceed with the treatment as planned and she is expected to start the first dose of this treatment on May 09, 2022. For the recently diagnosed pulmonary embolism, she will continue her current treatment with Eliquis for at least 6-12 months. The patient will come back for follow-up visit in 5 weeks for evaluation before starting cycle #2. She was advised to call immediately if she has any other concerning symptoms in the interval. The patient  voices understanding of current disease status and treatment options and is in agreement with the current care plan.  All questions were answered. The patient knows to call the clinic with any problems, questions or concerns. We can certainly see the patient much sooner if necessary.  The total time spent in the appointment was 30 minutes.  Disclaimer: This note was dictated with voice recognition software. Similar sounding words can inadvertently be transcribed and may not be corrected upon  review.

## 2022-05-02 NOTE — Progress Notes (Signed)
DISCONTINUE ON PATHWAY REGIMEN - Non-Small Cell Lung     A cycle is every 7 days, concurrent with RT:     Paclitaxel      Carboplatin   **Always confirm dose/schedule in your pharmacy ordering system**  REASON: Continuation Of Treatment PRIOR TREATMENT: [id number]: Carboplatin AUC=2 + Paclitaxel 45 mg/m2 Weekly During Radiation TREATMENT RESPONSE: Partial Response (PR)  START ON PATHWAY REGIMEN - Non-Small Cell Lung     A cycle is every 28 days:     Durvalumab   **Always confirm dose/schedule in your pharmacy ordering system**  Patient Characteristics: Preoperative or Nonsurgical Candidate (Clinical Staging), Stage III - Nonsurgical Candidate (Nonsquamous and Squamous), PS = 0, 1 Therapeutic Status: Preoperative or Nonsurgical Candidate (Clinical Staging) AJCC T Category: cT1a AJCC N Category: cN3 AJCC M Category: cM0 AJCC 8 Stage Grouping: IIIB ECOG Performance Status: 1 Intent of Therapy: Curative Intent, Discussed with Patient

## 2022-05-05 ENCOUNTER — Other Ambulatory Visit: Payer: Self-pay

## 2022-05-06 ENCOUNTER — Other Ambulatory Visit: Payer: Self-pay

## 2022-05-06 NOTE — Progress Notes (Deleted)
[facility] OFFICE PROGRESS NOTE  [name], [address] [place] [address]  DIAGNOSIS: ***  PRIOR THERAPY:  CURRENT THERAPY:  INTERVAL HISTORY: Yolanda Robertson 76 y.o. female returns for *** regular *** visit for followup of ***   MEDICAL HISTORY: Past Medical History:  Diagnosis Date   Hyperlipidemia    Hypertension    Lung mass    left upper lobe    ALLERGIES:  has no allergies on file.  MEDICATIONS:  Current Outpatient Medications  Medication Sig Dispense Refill   APIXABAN (ELIQUIS) VTE STARTER PACK (10MG  AND 5MG ) Take as directed on package: start with two-5mg  tablets twice daily for 7 days. On day 8, switch to one-5mg  tablet twice daily. 1 each 0   benzonatate (TESSALON) 200 MG capsule Take 1 capsule (200 mg total) by mouth 3 (three) times daily as needed for cough. 30 capsule 1   ibuprofen (ADVIL) 200 MG tablet Take 200 mg by mouth every 8 (eight) hours as needed (pain.).     indapamide (LOZOL) 1.25 MG tablet Take 1.25 mg by mouth in the morning.     lidocaine (XYLOCAINE) 2 % solution Patient: Mix 1part 2% viscous lidocaine, 1part H20. Swallow 54mL of diluted mixture, 46min before meals and at bedtime, up to QID prn soreness 200 mL 3   Polyvinyl Alcohol-Povidone PF (REFRESH) 1.4-0.6 % SOLN Place 1-2 drops into both eyes 3 (three) times daily as needed (dry/irritated eyes.).     prochlorperazine (COMPAZINE) 10 MG tablet Take 1 tablet (10 mg total) by mouth every 6 (six) hours as needed for nausea or vomiting. 30 tablet 0   rosuvastatin (CRESTOR) 10 MG tablet Take 10 mg by mouth in the morning.     No current facility-administered medications for this visit.    SURGICAL HISTORY:  Past Surgical History:  Procedure Laterality Date   FINE NEEDLE ASPIRATION  02/12/2022   Procedure: FINE NEEDLE ASPIRATION (FNA) LINEAR;  Surgeon: Garner Nash, DO;  Location: [facility];  Service: Pulmonary;;   VAGINAL HYSTERECTOMY     VIDEO  BRONCHOSCOPY WITH ENDOBRONCHIAL ULTRASOUND Left 02/12/2022   Procedure: VIDEO BRONCHOSCOPY WITH ENDOBRONCHIAL ULTRASOUND;  Surgeon: Garner Nash, DO;  Location: [facility];  Service: Pulmonary;  Laterality: Left;    REVIEW OF SYSTEMS:   Review of Systems  Constitutional: Negative for appetite change, chills, fatigue, fever and unexpected weight change.  HENT:   Negative for mouth sores, nosebleeds, sore throat and trouble swallowing.   Eyes: Negative for eye problems and icterus.  Respiratory: Negative for cough, hemoptysis, shortness of breath and wheezing.   Cardiovascular: Negative for chest pain and leg swelling.  Gastrointestinal: Negative for abdominal pain, constipation, diarrhea, nausea and vomiting.  Genitourinary: Negative for bladder incontinence, difficulty urinating, dysuria, frequency and hematuria.   Musculoskeletal: Negative for back pain, gait problem, neck pain and neck stiffness.  Skin: Negative for itching and rash.  Neurological: Negative for dizziness, extremity weakness, gait problem, headaches, light-headedness and seizures.  Hematological: Negative for adenopathy. Does not bruise/bleed easily.  Psychiatric/Behavioral: Negative for confusion, depression and sleep disturbance. The patient is not nervous/anxious.     PHYSICAL EXAMINATION:  There were no vitals taken for this visit.  ECOG PERFORMANCE STATUS: {CHL ONC ECOG [id number]  Physical Exam  Constitutional: Oriented to person, place, and time and well-developed, well-nourished, and in no distress. No distress.  HENT:  Head: Normocephalic and atraumatic.  Mouth/Throat: Oropharynx is clear and moist. No oropharyngeal exudate.  Eyes: Conjunctivae are normal. Right eye exhibits no discharge. Left eye exhibits no discharge. No scleral icterus.  Neck: Normal range of motion. Neck supple.  Cardiovascular: Normal rate, regular rhythm, normal heart sounds and intact distal pulses.   Pulmonary/Chest: Effort  normal and breath sounds normal. No respiratory distress. No wheezes. No rales.  Abdominal: Soft. Bowel sounds are normal. Exhibits no distension and no mass. There is no tenderness.  Musculoskeletal: Normal range of motion. Exhibits no edema.  Lymphadenopathy:    No cervical adenopathy.  Neurological: Alert and oriented to person, place, and time. Exhibits normal muscle tone. Gait normal. Coordination normal.  Skin: Skin is warm and dry. No rash noted. Not diaphoretic. No erythema. No pallor.  Psychiatric: Mood, memory and judgment normal.  Vitals reviewed.  LABORATORY DATA: Lab Results  Component Value Date   WBC 2.1 (L) 04/15/2022   HGB 10.5 (L) 04/15/2022   HCT 31.2 (L) 04/15/2022   MCV 86.9 04/15/2022   PLT 147 (L) 04/15/2022      Chemistry      Component Value Date/Time   NA 138 04/15/2022 1023   K 4.8 04/15/2022 1023   CL 108 04/15/2022 1023   CO2 24 04/15/2022 1023   BUN 29 (H) 04/15/2022 1023   CREATININE 1.09 (H) 04/15/2022 1023      Component Value Date/Time   CALCIUM 9.5 04/15/2022 1023   ALKPHOS 56 04/15/2022 1023   AST 11 (L) 04/15/2022 1023   ALT 10 04/15/2022 1023   BILITOT 0.4 04/15/2022 1023       RADIOGRAPHIC STUDIES:  CT Chest W Contrast  Addendum Date: 04/19/2022   ADDENDUM REPORT: 04/19/2022 16:09 ADDENDUM: Critical finding of incidental pulmonary embolism was reported to triage nurse Lanelle Bal RN after page to on-call provider was not returned. Electronically Signed   By: Jearld Lesch M.D.   On: 04/19/2022 16:09   Result Date: 04/19/2022 CLINICAL DATA:  Metastatic non-small cell lung cancer, assess treatment response * Tracking Code: BO * EXAM: CT CHEST WITH CONTRAST TECHNIQUE: Multidetector CT imaging of the chest was performed during intravenous contrast administration. RADIATION DOSE REDUCTION: This exam was performed according to the departmental dose-optimization program which includes automated exposure control, adjustment of the mA  and/or kV according to patient size and/or use of iterative reconstruction technique. CONTRAST:  39mL OMNIPAQUE IOHEXOL 300 MG/ML  SOLN COMPARISON:  PET-CT, 02/01/2022 FINDINGS: Cardiovascular: Aortic atherosclerosis. Normal heart size. No pericardial effusion. Incidental note of segmental to subsegmental, nonocclusive pulmonary embolus in the peripheral right lower lobe (series 2, image 93). Mediastinum/Nodes: Significant interval decrease in size of previously seen large, FDG avid AP window lymph node, measuring 2.3 x 1.7 cm, previously 4.5 x 2.6 cm (series 2, image 55) and a left hilar lymph node measuring 1.7 x 1.4 cm, previously 3.6 x 3.4 cm (series 2, image 70) thyroid gland, trachea, and esophagus demonstrate no significant findings. Lungs/Pleura: Spiculated, previously FDG avid nodule of the medial anterior left upper lobe is almost completely resolved, an irregular residual in this vicinity measuring no greater than 0.6 cm, previously 1.0 x 1.0 cm (series 5, image 57). Bland appearing, bandlike scarring of the lungs. No pleural effusion or pneumothorax. Upper Abdomen: No acute abnormality. Unchanged, macroscopic fat containing, not previously FDG avid definitively benign right adrenal adenoma measuring 3.1 x 2.4 cm (series 2, image 140). Musculoskeletal: No chest wall abnormality. No acute osseous findings. IMPRESSION: 1. Spiculated, previously FDG avid nodule of the medial anterior left upper lobe is almost completely  resolved, only an irregular residual remaining in this vicinity. 2. Significant interval decrease in size of previously seen large, FDG avid AP window lymph node and a left hilar lymph node. 3. Findings are consistent with treatment response of primary lung malignancy and nodal metastatic disease. 4. Incidental note of segmental to subsegmental, nonocclusive pulmonary embolus in the peripheral right lower lobe. Stat call report request was placed at the time of interpretation. Report will be  addended with communication information when critical results are communicated. Aortic Atherosclerosis (ICD10-I70.0). Electronically Signed: By: Jearld Lesch M.D. On: 04/19/2022 15:37     ASSESSMENT/PLAN:  No problem-specific Assessment & Plan notes found for this encounter.   No orders of the defined types were placed in this encounter.    I spent {CHL ONC TIME VISIT - IRWER:[id number]} counseling the patient face to face. The total time spent in the appointment was {CHL ONC TIME VISIT - PJKDT:[id number]}.  Amman Bartel L Artina Minella, PA-C 05/06/22

## 2022-05-08 ENCOUNTER — Inpatient Hospital Stay: Payer: Medicare Other | Admitting: Physician Assistant

## 2022-05-08 ENCOUNTER — Other Ambulatory Visit: Payer: Medicare Other

## 2022-05-08 ENCOUNTER — Encounter: Payer: Self-pay | Admitting: Internal Medicine

## 2022-05-08 ENCOUNTER — Inpatient Hospital Stay: Payer: Medicare Other

## 2022-05-08 ENCOUNTER — Other Ambulatory Visit: Payer: Self-pay | Admitting: Physician Assistant

## 2022-05-08 VITALS — BP 102/73 | HR 68 | Temp 98.2°F | Resp 17 | Wt 178.2 lb

## 2022-05-08 DIAGNOSIS — Z5111 Encounter for antineoplastic chemotherapy: Secondary | ICD-10-CM | POA: Diagnosis not present

## 2022-05-08 DIAGNOSIS — I2693 Single subsegmental pulmonary embolism without acute cor pulmonale: Secondary | ICD-10-CM | POA: Diagnosis not present

## 2022-05-08 DIAGNOSIS — C3492 Malignant neoplasm of unspecified part of left bronchus or lung: Secondary | ICD-10-CM

## 2022-05-08 DIAGNOSIS — R590 Localized enlarged lymph nodes: Secondary | ICD-10-CM | POA: Diagnosis not present

## 2022-05-08 DIAGNOSIS — Z86711 Personal history of pulmonary embolism: Secondary | ICD-10-CM | POA: Diagnosis not present

## 2022-05-08 DIAGNOSIS — Z7901 Long term (current) use of anticoagulants: Secondary | ICD-10-CM | POA: Diagnosis not present

## 2022-05-08 DIAGNOSIS — R5383 Other fatigue: Secondary | ICD-10-CM | POA: Diagnosis not present

## 2022-05-08 DIAGNOSIS — Z510 Encounter for antineoplastic radiation therapy: Secondary | ICD-10-CM | POA: Diagnosis present

## 2022-05-08 DIAGNOSIS — I700 Atherosclerosis of aorta: Secondary | ICD-10-CM | POA: Diagnosis not present

## 2022-05-08 DIAGNOSIS — C3412 Malignant neoplasm of upper lobe, left bronchus or lung: Secondary | ICD-10-CM | POA: Diagnosis not present

## 2022-05-08 DIAGNOSIS — J984 Other disorders of lung: Secondary | ICD-10-CM | POA: Diagnosis not present

## 2022-05-08 LAB — CBC WITH DIFFERENTIAL (CANCER CENTER ONLY)
Abs Immature Granulocytes: 0.03 10*3/uL (ref 0.00–0.07)
Basophils Absolute: 0 10*3/uL (ref 0.0–0.1)
Basophils Relative: 1 %
Eosinophils Absolute: 0.1 10*3/uL (ref 0.0–0.5)
Eosinophils Relative: 4 %
HCT: 30.2 % — ABNORMAL LOW (ref 36.0–46.0)
Hemoglobin: 10.2 g/dL — ABNORMAL LOW (ref 12.0–15.0)
Immature Granulocytes: 1 %
Lymphocytes Relative: 25 %
Lymphs Abs: 0.8 10*3/uL (ref 0.7–4.0)
MCH: 29.9 pg (ref 26.0–34.0)
MCHC: 33.8 g/dL (ref 30.0–36.0)
MCV: 88.6 fL (ref 80.0–100.0)
Monocytes Absolute: 0.4 10*3/uL (ref 0.1–1.0)
Monocytes Relative: 13 %
Neutro Abs: 1.8 10*3/uL (ref 1.7–7.7)
Neutrophils Relative %: 56 %
Platelet Count: 198 10*3/uL (ref 150–400)
RBC: 3.41 MIL/uL — ABNORMAL LOW (ref 3.87–5.11)
RDW: 16.6 % — ABNORMAL HIGH (ref 11.5–15.5)
WBC Count: 3.3 10*3/uL — ABNORMAL LOW (ref 4.0–10.5)
nRBC: 0 % (ref 0.0–0.2)

## 2022-05-08 LAB — CMP (CANCER CENTER ONLY)
ALT: 11 U/L (ref 0–44)
AST: 13 U/L — ABNORMAL LOW (ref 15–41)
Albumin: 3.9 g/dL (ref 3.5–5.0)
Alkaline Phosphatase: 61 U/L (ref 38–126)
Anion gap: 7 (ref 5–15)
BUN: 21 mg/dL (ref 8–23)
CO2: 23 mmol/L (ref 22–32)
Calcium: 9.5 mg/dL (ref 8.9–10.3)
Chloride: 109 mmol/L (ref 98–111)
Creatinine: 1.11 mg/dL — ABNORMAL HIGH (ref 0.44–1.00)
GFR, Estimated: 52 mL/min — ABNORMAL LOW (ref >60)
Glucose, Bld: 90 mg/dL (ref 70–99)
Potassium: 4.8 mmol/L (ref 3.5–5.1)
Sodium: 139 mmol/L (ref 135–145)
Total Bilirubin: 0.3 mg/dL (ref 0.3–1.2)
Total Protein: 7 g/dL (ref 6.5–8.1)

## 2022-05-08 LAB — TSH: TSH: 1.694 u[IU]/mL (ref 0.350–4.500)

## 2022-05-08 MED ORDER — SODIUM CHLORIDE 0.9 % IV SOLN
1500.0000 mg | Freq: Once | INTRAVENOUS | Status: AC
  Administered 2022-05-08: 1500 mg via INTRAVENOUS
  Filled 2022-05-08: qty 30

## 2022-05-08 MED ORDER — SODIUM CHLORIDE 0.9 % IV SOLN: Freq: Once | INTRAVENOUS | Status: AC

## 2022-05-08 MED ORDER — LIDOCAINE-PRILOCAINE 2.5-2.5 % EX CREA: 1.0000 | TOPICAL_CREAM | CUTANEOUS | 2 refills | Status: AC | PRN

## 2022-05-08 NOTE — Progress Notes (Signed)
Yolanda Robertson is here following up for completion of radiation treatment for lung cancer. She completed treatment on 04-17-22.   PAIN: Back pain from getting leaves  up  RESPIRATORY: Shortness of Breath with moderate exertion and Coughing  Dry. Pt is on room air. Now on blood thinner for blood clot that was discovered after treatment.   SWALLOWING/DIET: Pt reports now problems eating or drinking.   OTHER: Pt complains of fatigue takes naps when tired.   There were no vitals taken for this visit.   Wt Readings from Last 3 Encounters:  05/02/22 176 lb 5 oz (80 kg)  04/15/22 174 lb 12 oz (79.3 kg)  04/08/22 175 lb 4.8 oz (79.5 kg)   Vitals:   05/17/22 1034  BP: 110/71  Pulse: 84  Resp: 18  Temp: 98 F (36.7 C)  SpO2: 98%   Port placed to be placed on the 2nd of January.

## 2022-05-08 NOTE — Progress Notes (Signed)
                                                                                                                                                             Patient Name: Yolanda Robertson MRN: [id number] DOB: 1945/11/28 Referring Physician: [name] (Profile Not Attached) Date of Service: 04/17/2022 [facility], [place]                                                        End Of Treatment Note  Diagnoses: C34.12-Malignant neoplasm of upper lobe, left bronchus or lung  Cancer Staging:  Cancer Staging  Non-small cell carcinoma of left lung, stage 3 (HCC) Staging form: Lung, AJCC 8th Edition - Clinical: Stage IIIB (cT1a, cN3, cM0) - Signed by Curt Bears, MD on 02/21/2022  Primary cancer of left upper lobe of lung [facility]) Staging form: Lung, AJCC 8th Edition - Clinical stage from 02/26/2022: Stage IIIA (cT1a, cN2, cM0) - Signed by Eppie Gibson, MD on 02/26/2022 Stage prefix: Initial diagnosis  Intent: Curative  Radiation Treatment Dates: 03/05/2022 through 04/17/2022 Site Technique Total Dose (Gy) Dose per Fx (Gy) Completed Fx Beam Energies  Thorax: Chest_LUL 3D 66/66 2 33/33 6X, 10X   Narrative: The patient tolerated radiation therapy relatively well.   Plan: The patient will follow-up with radiation oncology in 68mo. -----------------------------------  Eppie Gibson, MD

## 2022-05-08 NOTE — Patient Instructions (Signed)
[facility]  Discharge Instructions: Thank you for choosing [facility] to provide your oncology and hematology care.   If you have a lab appointment with the [facility], please go directly to the [facility] and check in at the registration area.   Wear comfortable clothing and clothing appropriate for easy access to any Portacath or PICC line.   We strive to give you quality time with your provider. You may need to reschedule your appointment if you arrive late (15 or more minutes).  Arriving late affects you and other patients whose appointments are after yours.  Also, if you miss three or more appointments without notifying the office, you may be dismissed from the clinic at the provider's discretion.      For prescription refill requests, have your pharmacy contact our office and allow 72 hours for refills to be completed.    Today you received the following chemotherapy and/or immunotherapy agents Imfinzi      To help prevent nausea and vomiting after your treatment, we encourage you to take your nausea medication as directed.  BELOW ARE SYMPTOMS THAT SHOULD BE REPORTED IMMEDIATELY: *FEVER GREATER THAN 100.4 F (38 C) OR HIGHER *CHILLS OR SWEATING *NAUSEA AND VOMITING THAT IS NOT CONTROLLED WITH YOUR NAUSEA MEDICATION *UNUSUAL SHORTNESS OF BREATH *UNUSUAL BRUISING OR BLEEDING *URINARY PROBLEMS (pain or burning when urinating, or frequent urination) *BOWEL PROBLEMS (unusual diarrhea, constipation, pain near the anus) TENDERNESS IN MOUTH AND THROAT WITH OR WITHOUT PRESENCE OF ULCERS (sore throat, sores in mouth, or a toothache) UNUSUAL RASH, SWELLING OR PAIN  UNUSUAL VAGINAL DISCHARGE OR ITCHING   Items with * indicate a potential emergency and should be followed up as soon as possible or go to the Emergency Department if any problems should occur.  Please show the CHEMOTHERAPY ALERT CARD or IMMUNOTHERAPY ALERT CARD at check-in to the  Emergency Department and triage nurse.  Should you have questions after your visit or need to cancel or reschedule your appointment, please contact [facility]  Dept: [phone]  and follow the prompts.  Office hours are 8:00 a.m. to 4:30 p.m. Monday - Friday. Please note that voicemails left after 4:00 p.m. may not be returned until the following business day.  We are closed weekends and major holidays. You have access to a nurse at all times for urgent questions. Please call the main number to the clinic Dept: [phone] and follow the prompts.   For any non-urgent questions, you may also contact your provider using MyChart. We now offer e-Visits for anyone 72 and older to request care online for non-urgent symptoms. For details visit [email or web address].   Also download the MyChart app! Go to the app store, search "MyChart", open the app, select [facility], and log in with your MyChart username and password.  Masks are optional in the cancer centers. If you would like for your care team to wear a mask while they are taking care of you, please let them know. You may have one support person who is at least 75 years old accompany you for your appointments. Durvalumab Injection What is this medication? DURVALUMAB (dur VAL ue mab) treats some types of cancer. It works by helping your immune system slow or stop the spread of cancer cells. It is a monoclonal antibody. This medicine may be used for other purposes; ask your health care provider or pharmacist if you have questions. COMMON BRAND NAME(S): IMFINZI What should I  tell my care team before I take this medication? They need to know if you have any of these conditions: Allogeneic stem cell transplant (uses someone else's stem cells) Autoimmune diseases, such as Crohn disease, ulcerative colitis, lupus History of chest radiation Nervous system problems, such as Guillain-Barre syndrome, myasthenia  gravis Organ transplant An unusual or allergic reaction to durvalumab, other medications, foods, dyes, or preservatives Pregnant or trying to get pregnant Breast-feeding How should I use this medication? This medication is infused into a vein. It is given by your care team in a hospital or clinic setting. A special MedGuide will be given to you before each treatment. Be sure to read this information carefully each time. Talk to your care team about the use of this medication in children. Special care may be needed. Overdosage: If you think you have taken too much of this medicine contact a poison control center or emergency room at once. NOTE: This medicine is only for you. Do not share this medicine with others. What if I miss a dose? Keep appointments for follow-up doses. It is important not to miss your dose. Call your care team if you are unable to keep an appointment. What may interact with this medication? Interactions have not been studied. This list may not describe all possible interactions. Give your health care provider a list of all the medicines, herbs, non-prescription drugs, or dietary supplements you use. Also tell them if you smoke, drink alcohol, or use illegal drugs. Some items may interact with your medicine. What should I watch for while using this medication? Your condition will be monitored carefully while you are receiving this medication. You may need blood work while taking this medication. This medication may cause serious skin reactions. They can happen weeks to months after starting the medication. Contact your care team right away if you notice fevers or flu-like symptoms with a rash. The rash may be red or purple and then turn into blisters or peeling of the skin. You may also notice a red rash with swelling of the face, lips, or lymph nodes in your neck or under your arms. Tell your care team right away if you have any change in your eyesight. Talk to your care  team if you may be pregnant. Serious birth defects can occur if you take this medication during pregnancy and for 3 months after the last dose. You will need a negative pregnancy test before starting this medication. Contraception is recommended while taking this medication and for 3 months after the last dose. Your care team can help you find the option that works for you. Do not breastfeed while taking this medication and for 3 months after the last dose. What side effects may I notice from receiving this medication? Side effects that you should report to your care team as soon as possible: Allergic reactions--skin rash, itching, hives, swelling of the face, lips, tongue, or throat Dry cough, shortness of breath or trouble breathing Eye pain, redness, irritation, or discharge with blurry or decreased vision Heart muscle inflammation--unusual weakness or fatigue, shortness of breath, chest pain, fast or irregular heartbeat, dizziness, swelling of the ankles, feet, or hands Hormone gland problems--headache, sensitivity to light, unusual weakness or fatigue, dizziness, fast or irregular heartbeat, increased sensitivity to cold or heat, excessive sweating, constipation, hair loss, increased thirst or amount of urine, tremors or shaking, irritability Infusion reactions--chest pain, shortness of breath or trouble breathing, feeling faint or lightheaded Kidney injury (glomerulonephritis)--decrease in the amount of urine,  red or dark brown urine, foamy or bubbly urine, swelling of the ankles, hands, or feet Liver injury--right upper belly pain, loss of appetite, nausea, light-colored stool, dark yellow or brown urine, yellowing skin or eyes, unusual weakness or fatigue Pain, tingling, or numbness in the hands or feet, muscle weakness, change in vision, confusion or trouble speaking, loss of balance or coordination, trouble walking, seizures Rash, fever, and swollen lymph nodes Redness, blistering, peeling,  or loosening of the skin, including inside the mouth Sudden or severe stomach pain, bloody diarrhea, fever, nausea, vomiting Side effects that usually do not require medical attention (report these to your care team if they continue or are bothersome): Bone, joint, or muscle pain Diarrhea Fatigue Loss of appetite Nausea Skin rash This list may not describe all possible side effects. Call your doctor for medical advice about side effects. You may report side effects to FDA at 1-800-FDA-1088. Where should I keep my medication? This medication is given in a hospital or clinic. It will not be stored at home. NOTE: This sheet is a summary. It may not cover all possible information. If you have questions about this medicine, talk to your doctor, pharmacist, or health care provider.  2023 Elsevier/Gold Standard (2021-09-03 00:00:00)

## 2022-05-10 ENCOUNTER — Other Ambulatory Visit: Payer: Self-pay

## 2022-05-10 LAB — T4: T4, Total: 6.7 ug/dL (ref 4.5–12.0)

## 2022-05-13 ENCOUNTER — Ambulatory Visit: Payer: Medicare Other | Admitting: Internal Medicine

## 2022-05-15 ENCOUNTER — Other Ambulatory Visit: Payer: Self-pay | Admitting: Medical Oncology

## 2022-05-15 DIAGNOSIS — I2699 Other pulmonary embolism without acute cor pulmonale: Secondary | ICD-10-CM

## 2022-05-15 MED ORDER — APIXABAN 5 MG PO TABS: 5.0000 mg | ORAL_TABLET | Freq: Two times a day (BID) | ORAL | 1 refills | Status: DC

## 2022-05-16 ENCOUNTER — Other Ambulatory Visit: Payer: Self-pay

## 2022-05-17 ENCOUNTER — Ambulatory Visit
Admission: RE | Admit: 2022-05-17 | Discharge: 2022-05-17 | Disposition: A | Discharge status: Home / Self Care | Payer: Medicare Other | Source: Ambulatory Visit | Attending: Radiation Oncology | Admitting: Radiation Oncology

## 2022-05-17 ENCOUNTER — Encounter: Payer: Self-pay | Admitting: Radiation Oncology

## 2022-05-17 VITALS — BP 110/71 | HR 84 | Temp 98.0°F | Resp 18 | Ht 64.0 in | Wt 175.4 lb

## 2022-05-17 DIAGNOSIS — Z923 Personal history of irradiation: Secondary | ICD-10-CM | POA: Diagnosis not present

## 2022-05-17 DIAGNOSIS — Z7901 Long term (current) use of anticoagulants: Secondary | ICD-10-CM | POA: Diagnosis not present

## 2022-05-17 DIAGNOSIS — I700 Atherosclerosis of aorta: Secondary | ICD-10-CM | POA: Insufficient documentation

## 2022-05-17 DIAGNOSIS — C3412 Malignant neoplasm of upper lobe, left bronchus or lung: Secondary | ICD-10-CM | POA: Insufficient documentation

## 2022-05-17 DIAGNOSIS — I2693 Single subsegmental pulmonary embolism without acute cor pulmonale: Secondary | ICD-10-CM | POA: Diagnosis not present

## 2022-05-17 NOTE — Progress Notes (Signed)
Radiation Oncology         [phone] ________________________________  Name: Yolanda Robertson MRN: [id number]  Date: 05/17/2022  DOB: Sep 12, 1945  Follow-Up Visit Note  Outpatient  CC: [name], MD  [name], MD  Diagnosis and Prior Radiotherapy:    ICD-10-CM   1. Primary cancer of left upper lobe of lung (HCC)  C34.12      Cancer Staging  Non-small cell carcinoma of left lung, stage 3 (HCC) Staging form: Lung, AJCC 8th Edition - Clinical: Stage IIIB (cT1a, cN3, cM0) - Signed by Curt Bears, MD on 02/21/2022  Primary cancer of left upper lobe of lung [facility]) Staging form: Lung, AJCC 8th Edition - Clinical stage from 02/26/2022: Stage IIIA (cT1a, cN2, cM0) - Signed by Eppie Gibson, MD on 02/26/2022 Stage prefix: Initial diagnosis    CHIEF COMPLAINT: Here for follow-up and surveillance of lung cancer  Narrative:  The patient returns today for routine follow-up.  She is doing well overall.  She is getting treated for a pulmonary embolus with blood thinners now.  She was diagnosed with this about a month ago.  No persistent sequela from radiation therapy other than a little fatigue, overall she feels good                              ALLERGIES:  has no allergies on file.  Meds: Current Outpatient Medications  Medication Sig Dispense Refill   apixaban (ELIQUIS) 5 MG TABS tablet Take 1 tablet (5 mg total) by mouth 2 (two) times daily. 60 tablet 1   APIXABAN (ELIQUIS) VTE STARTER PACK (10MG  AND 5MG ) Take as directed on package: start with two-5mg  tablets twice daily for 7 days. On day 8, switch to one-5mg  tablet twice daily. 1 each 0   benzonatate (TESSALON) 200 MG capsule Take 1 capsule (200 mg total) by mouth 3 (three) times daily as needed for cough. 30 capsule 1   ibuprofen (ADVIL) 200 MG tablet Take 200 mg by mouth every 8 (eight) hours as needed (pain.).     lidocaine-prilocaine (EMLA) cream Apply 1 Application topically as needed. 30 g 2   Polyvinyl  Alcohol-Povidone PF (REFRESH) 1.4-0.6 % SOLN Place 1-2 drops into both eyes 3 (three) times daily as needed (dry/irritated eyes.).     rosuvastatin (CRESTOR) 10 MG tablet Take 10 mg by mouth in the morning.     indapamide (LOZOL) 1.25 MG tablet Take 1.25 mg by mouth in the morning. (Patient not taking: Reported on 05/17/2022)     lidocaine (XYLOCAINE) 2 % solution Patient: Mix 1part 2% viscous lidocaine, 1part H20. Swallow 34mL of diluted mixture, 19min before meals and at bedtime, up to QID prn soreness (Patient not taking: Reported on 05/17/2022) 200 mL 3   prochlorperazine (COMPAZINE) 10 MG tablet Take 1 tablet (10 mg total) by mouth every 6 (six) hours as needed for nausea or vomiting. (Patient not taking: Reported on 05/17/2022) 30 tablet 0   No current facility-administered medications for this encounter.    Physical Findings: The patient is in no acute distress. Patient is alert and oriented.  height is 5\' 4"  (1.626 m) and weight is 175 lb 6.4 oz (79.6 kg). Her oral temperature is 98 F (36.7 C). Her blood pressure is 110/71 and her pulse is 84. Her respiration is 18 and oxygen saturation is 98%. .    Heart regular in rate and rhythm Chest clear to auscultation bilaterally MSK :  Ambulatory  Lab Findings: Lab Results  Component Value Date   WBC 3.3 (L) 05/08/2022   HGB 10.2 (L) 05/08/2022   HCT 30.2 (L) 05/08/2022   MCV 88.6 05/08/2022   PLT 198 05/08/2022    Radiographic Findings: CT Chest W Contrast  Addendum Date: 04/19/2022   ADDENDUM REPORT: 04/19/2022 16:09 ADDENDUM: Critical finding of incidental pulmonary embolism was reported to triage nurse Kris Hartmann RN after page to on-call provider was not returned. Electronically Signed   By: Delanna Ahmadi M.D.   On: 04/19/2022 16:09   Result Date: 04/19/2022 CLINICAL DATA:  Metastatic non-small cell lung cancer, assess treatment response * Tracking Code: BO * EXAM: CT CHEST WITH CONTRAST TECHNIQUE: Multidetector CT imaging of  the chest was performed during intravenous contrast administration. RADIATION DOSE REDUCTION: This exam was performed according to the departmental dose-optimization program which includes automated exposure control, adjustment of the mA and/or kV according to patient size and/or use of iterative reconstruction technique. CONTRAST:  50mL OMNIPAQUE IOHEXOL 300 MG/ML  SOLN COMPARISON:  PET-CT, 02/01/2022 FINDINGS: Cardiovascular: Aortic atherosclerosis. Normal heart size. No pericardial effusion. Incidental note of segmental to subsegmental, nonocclusive pulmonary embolus in the peripheral right lower lobe (series 2, image 93). Mediastinum/Nodes: Significant interval decrease in size of previously seen large, FDG avid AP window lymph node, measuring 2.3 x 1.7 cm, previously 4.5 x 2.6 cm (series 2, image 55) and a left hilar lymph node measuring 1.7 x 1.4 cm, previously 3.6 x 3.4 cm (series 2, image 70) thyroid gland, trachea, and esophagus demonstrate no significant findings. Lungs/Pleura: Spiculated, previously FDG avid nodule of the medial anterior left upper lobe is almost completely resolved, an irregular residual in this vicinity measuring no greater than 0.6 cm, previously 1.0 x 1.0 cm (series 5, image 57). Bland appearing, bandlike scarring of the lungs. No pleural effusion or pneumothorax. Upper Abdomen: No acute abnormality. Unchanged, macroscopic fat containing, not previously FDG avid definitively benign right adrenal adenoma measuring 3.1 x 2.4 cm (series 2, image 140). Musculoskeletal: No chest wall abnormality. No acute osseous findings. IMPRESSION: 1. Spiculated, previously FDG avid nodule of the medial anterior left upper lobe is almost completely resolved, only an irregular residual remaining in this vicinity. 2. Significant interval decrease in size of previously seen large, FDG avid AP window lymph node and a left hilar lymph node. 3. Findings are consistent with treatment response of primary lung  malignancy and nodal metastatic disease. 4. Incidental note of segmental to subsegmental, nonocclusive pulmonary embolus in the peripheral right lower lobe. Stat call report request was placed at the time of interpretation. Report will be addended with communication information when critical results are communicated. Aortic Atherosclerosis (ICD10-I70.0). Electronically Signed: By: Delanna Ahmadi M.D. On: 04/19/2022 15:37    Impression/Plan: She is doing well after radiation therapy and will continue to follow closely with medical oncology.  They will order her restaging scans.  I will see her back as needed.  She is pleased with this plan.  On date of service, in total, I spent 15 minutes on this encounter. Patient was seen in person.  _____________________________________   Eppie Gibson, MD

## 2022-05-24 ENCOUNTER — Other Ambulatory Visit: Payer: Self-pay | Admitting: Radiology

## 2022-05-24 ENCOUNTER — Telehealth: Payer: Self-pay | Admitting: Internal Medicine

## 2022-05-24 NOTE — Telephone Encounter (Signed)
Called patient regarding all upcoming appointments, patient has been called and notified. 

## 2022-05-27 NOTE — H&P (Signed)
Chief Complaint: Patient was seen in consultation today for [facility] placement at the request of Heilingoetter,Cassandra L  Referring Physician(s): Heilingoetter,Cassandra L  Supervising Physician: Corrie Mckusick  Patient Status: [facility] - Out-pt  History of Present Illness: Yolanda Robertson is a 77 y.o. female with PMHs of HTN, HLD, LUL lung CA who presents for [facility] placement.   Patient is known to IR service for left cervical lymph node biopsy on 02/20/22, performed by Dr. Pascal Lux.   Patient underwent lung CA screening CT in July 2023 which showed LUL pulmonary nodule  and mediastinal and left infrahilar adenopathy highly suspicious for malignancy with metastasis. PET on 02/02/22 showed hypermetabolic left cervical, mediastinal, and left hilar nodal disease compatible with metastases or lymphoproliferative disorder.   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   Patient was referred to med and radiation oncology, concurrent chemoradiation therapy was recommended to the patient. Patient underwent CT chest with contrast on 04/19/22 to eval response to treatment. The CT showed that the patient had good response to the treatment; however, it also showed incidental note of segmental to subsegmental, nonocclusive pulmonary embolus in the peripheral right lower lobe. Patient was started on Eliquis.   IR was requested for image guided port-a-catheter placement.  ***   Past Medical History:  Diagnosis Date   Hyperlipidemia    Hypertension    Lung mass    left upper lobe    Past Surgical History:  Procedure Laterality Date   FINE NEEDLE ASPIRATION  02/12/2022   Procedure: FINE NEEDLE ASPIRATION (FNA) LINEAR;  Surgeon: Garner Nash, DO;  Location: [facility];  Service: Pulmonary;;   VAGINAL HYSTERECTOMY     VIDEO BRONCHOSCOPY WITH  ENDOBRONCHIAL ULTRASOUND Left 02/12/2022   Procedure: VIDEO BRONCHOSCOPY WITH ENDOBRONCHIAL ULTRASOUND;  Surgeon: Garner Nash, DO;  Location: [facility];  Service: Pulmonary;  Laterality: Left;    Allergies: Patient has no allergy information on record.  Medications: Prior to Admission medications   Medication Sig Start Date End Date Taking? Authorizing Provider  apixaban (ELIQUIS) 5 MG TABS tablet Take 1 tablet (5 mg total) by mouth 2 (two) times daily. 05/15/22   Curt Bears, MD  APIXABAN Arne Cleveland) VTE STARTER PACK (10MG  AND 5MG ) Take as directed on package: start with two-5mg  tablets twice daily for 7 days. On day 8, switch to one-5mg  tablet twice daily. 04/19/22   Orson Slick, MD  benzonatate (TESSALON) 200 MG capsule Take 1 capsule (200 mg total) by mouth 3 (three) times daily as needed for cough. 02/19/22   Martyn Ehrich, NP  ibuprofen (ADVIL) 200 MG tablet Take 200 mg by mouth every 8 (eight) hours as needed (pain.).    [provider]  indapamide (LOZOL) 1.25 MG tablet Take 1.25 mg by mouth in the morning. Patient not taking: Reported on 05/17/2022 09/28/19   [provider]  lidocaine (XYLOCAINE) 2 % solution Patient: Mix 1part 2% viscous lidocaine, 1part H20. Swallow 43mL of diluted mixture, 12min before meals and at bedtime, up to QID prn soreness Patient not taking: Reported on 05/17/2022 04/01/22   Eppie Gibson, MD  lidocaine-prilocaine (EMLA) cream Apply 1 Application topically as needed. 05/08/22   Heilingoetter, Cassandra L, PA-C  Polyvinyl Alcohol-Povidone PF (REFRESH) 1.4-0.6 % SOLN Place 1-2 drops into both eyes 3 (three) times daily as needed (dry/irritated eyes.).    [provider]  prochlorperazine (COMPAZINE) 10 MG tablet Take 1 tablet (10 mg total) by mouth every 6 (six) hours as needed for nausea or vomiting. Patient not taking: Reported on 05/17/2022 02/21/22   Curt Bears, MD  rosuvastatin (CRESTOR) 10 MG tablet Take  10 mg by mouth in the morning. 07/13/19   [provider]     No family history on file.  Social History   Socioeconomic History   Marital status: Married    Spouse name: Meda Coffee   Number of children: 4   Years of education: Not on file   Highest education level: Not on file  Occupational History   Not on file  Tobacco Use   Smoking status: Former    Packs/day: 1.00    Types: Cigarettes    Quit date: 12/23/2021    Years since quitting: 0.4    Passive exposure: Past   Smokeless tobacco: Never  Vaping Use   Vaping Use: Never used  Substance and Sexual Activity   Alcohol use: Never   Drug use: Never   Sexual activity: Yes  Other Topics Concern   Not on file  Social History Narrative   Not on file   Social Determinants of Health   Financial Resource Strain: Not on file  Food Insecurity: Not on file  Transportation Needs: Not on file  Physical Activity: Not on file  Stress: Not on file  Social Connections: Not on file     Review of Systems: A 12 point ROS discussed and pertinent positives are indicated in the HPI above.  All other systems are negative.  Vital Signs: There were no vitals taken for this visit.  *** Physical Exam     Imaging: No results found.  Labs:  CBC: Recent Labs    04/01/22 1126 04/08/22 0826 04/15/22 1023 05/08/22 0935  WBC 2.6* 2.3* 2.1* 3.3*  HGB 11.2* 10.6* 10.5* 10.2*  HCT 33.7* 32.1* 31.2* 30.2*  PLT 147* 137* 147* 198    COAGS: Recent Labs    02/20/22 1101  INR 1.0    BMP: Recent Labs    04/01/22 1126 04/08/22 0826 04/15/22 1023 05/08/22 0935  NA 136 138 138 139  K 4.7 4.8 4.8 4.8  CL 106 109 108 109  CO2 24 24 24 23   GLUCOSE 83 77 94 90  BUN 23 25* 29* 21  CALCIUM 9.4 9.1 9.5 9.5  CREATININE 1.13* 1.12* 1.09* 1.11*  GFRNONAA 51* 51* 53* 52*    LIVER FUNCTION TESTS: Recent Labs    04/01/22 1126 04/08/22 0826 04/15/22 1023 05/08/22 0935  BILITOT 0.4 0.3 0.4 0.3  AST 12* 11* 11* 13*   ALT 11 11 10 11   ALKPHOS 63 58 56 61  PROT 7.7 7.5 7.5 7.0  ALBUMIN 4.2 4.1 4.3 3.9    TUMOR MARKERS: No results for input(s): "AFPTM", "CEA", "CA199", "CHROMGRNA" in the last 8760 hours.  Assessment and Plan: 77 y.o. female with LUL lung CA who is in need of long term CVC for chemotherapy.   NPO since MN VSS On Eliquis 5 mg BID, no need for d/c.   Risks and benefits of image guided port-a-catheter placement was discussed with the patient including, but not limited to bleeding, infection, pneumothorax, or fibrin sheath development and need for additional procedures.  All of the patient's questions were answered, patient is agreeable to proceed. Consent signed and in chart.    Thank you for this interesting consult.  I greatly enjoyed meeting Yolanda Robertson and look forward to participating in their care.  A copy of this report was sent to the requesting provider on this date.  Electronically Signed: Tera Mater, PA-C 05/27/2022, 8:17 AM   I spent a total of {New FYTW:[id number]} {New Out-Pt:[id number]}  {Established Out-Pt:[id number]} in face to face in clinical consultation, greater than 50% of which was counseling/coordinating care for ***  This chart was dictated using voice recognition software.  Despite best efforts to proofread,  errors can occur which can change the documentation meaning.

## 2022-05-28 ENCOUNTER — Encounter (HOSPITAL_COMMUNITY): Payer: Self-pay | Admitting: *Deleted

## 2022-05-28 ENCOUNTER — Ambulatory Visit (HOSPITAL_COMMUNITY)
Admission: RE | Admit: 2022-05-28 | Discharge: 2022-05-28 | Disposition: A | Discharge status: Home / Self Care | Payer: Medicare Other | Source: Ambulatory Visit | Attending: Physician Assistant | Admitting: Physician Assistant

## 2022-05-28 DIAGNOSIS — E785 Hyperlipidemia, unspecified: Secondary | ICD-10-CM | POA: Diagnosis not present

## 2022-05-28 DIAGNOSIS — C3492 Malignant neoplasm of unspecified part of left bronchus or lung: Secondary | ICD-10-CM | POA: Insufficient documentation

## 2022-05-28 DIAGNOSIS — I10 Essential (primary) hypertension: Secondary | ICD-10-CM | POA: Diagnosis not present

## 2022-05-28 DIAGNOSIS — Z452 Encounter for adjustment and management of vascular access device: Secondary | ICD-10-CM | POA: Diagnosis not present

## 2022-05-28 DIAGNOSIS — Z87891 Personal history of nicotine dependence: Secondary | ICD-10-CM | POA: Insufficient documentation

## 2022-05-28 DIAGNOSIS — C3412 Malignant neoplasm of upper lobe, left bronchus or lung: Secondary | ICD-10-CM | POA: Diagnosis not present

## 2022-05-28 HISTORY — PX: IR IMAGING GUIDED PORT INSERTION: IMG5740

## 2022-05-28 MED ORDER — FENTANYL CITRATE (PF) 100 MCG/2ML IJ SOLN
INTRAMUSCULAR | Status: AC | PRN
  Administered 2022-05-28 (×2): 50 ug via INTRAVENOUS

## 2022-05-28 MED ORDER — FENTANYL CITRATE (PF) 100 MCG/2ML IJ SOLN
INTRAMUSCULAR | Status: AC
  Filled 2022-05-28: qty 2

## 2022-05-28 MED ORDER — HEPARIN SOD (PORK) LOCK FLUSH 100 UNIT/ML IV SOLN
INTRAVENOUS | Status: AC
  Administered 2022-05-28: 500 [IU] via INTRAVENOUS
  Filled 2022-05-28: qty 5

## 2022-05-28 MED ORDER — LIDOCAINE HCL 1 % IJ SOLN
INTRAMUSCULAR | Status: AC
  Administered 2022-05-28: 15 mL via INTRADERMAL
  Filled 2022-05-28: qty 20

## 2022-05-28 MED ORDER — MIDAZOLAM HCL 2 MG/2ML IJ SOLN
INTRAMUSCULAR | Status: AC
  Filled 2022-05-28: qty 4

## 2022-05-28 MED ORDER — LIDOCAINE HCL 1 % IJ SOLN
INTRAMUSCULAR | Status: AC | PRN
  Administered 2022-05-28: 20 mL

## 2022-05-28 MED ORDER — SODIUM CHLORIDE 0.9 % IV SOLN: INTRAVENOUS | Status: DC

## 2022-05-28 MED ORDER — MIDAZOLAM HCL 2 MG/2ML IJ SOLN
INTRAMUSCULAR | Status: AC | PRN
  Administered 2022-05-28 (×2): 1 mg via INTRAVENOUS

## 2022-05-28 NOTE — Procedures (Signed)
Interventional Radiology Procedure Note  Procedure: Placement of a right IJ approach single lumen PowerPort.  Tip is positioned at the superior cavoatrial junction and catheter is ready for immediate use.  Complications: None Recommendations:  - Ok to shower tomorrow - Do not submerge for 7 days - Routine line care   Signed,  Maurene Hollin S. Breyana Follansbee, DO   

## 2022-05-29 ENCOUNTER — Other Ambulatory Visit: Payer: Self-pay | Admitting: Family Medicine

## 2022-05-29 DIAGNOSIS — Z1231 Encounter for screening mammogram for malignant neoplasm of breast: Secondary | ICD-10-CM

## 2022-05-30 ENCOUNTER — Other Ambulatory Visit: Payer: Self-pay

## 2022-05-31 DIAGNOSIS — J019 Acute sinusitis, unspecified: Secondary | ICD-10-CM | POA: Diagnosis not present

## 2022-05-31 DIAGNOSIS — R059 Cough, unspecified: Secondary | ICD-10-CM | POA: Diagnosis not present

## 2022-06-04 ENCOUNTER — Ambulatory Visit: Payer: Self-pay | Admitting: Radiation Oncology

## 2022-06-04 ENCOUNTER — Other Ambulatory Visit: Payer: Self-pay

## 2022-06-06 ENCOUNTER — Other Ambulatory Visit: Payer: Medicare Other

## 2022-06-06 ENCOUNTER — Other Ambulatory Visit: Payer: Self-pay

## 2022-06-06 ENCOUNTER — Inpatient Hospital Stay: Payer: Medicare Other

## 2022-06-06 ENCOUNTER — Inpatient Hospital Stay: Payer: Medicare Other | Attending: Internal Medicine | Admitting: Internal Medicine

## 2022-06-06 ENCOUNTER — Encounter: Payer: Self-pay | Admitting: Internal Medicine

## 2022-06-06 VITALS — BP 92/60 | HR 79 | Temp 98.4°F | Resp 16

## 2022-06-06 DIAGNOSIS — Z90710 Acquired absence of both cervix and uterus: Secondary | ICD-10-CM | POA: Diagnosis not present

## 2022-06-06 DIAGNOSIS — Z510 Encounter for antineoplastic radiation therapy: Secondary | ICD-10-CM | POA: Diagnosis present

## 2022-06-06 DIAGNOSIS — R630 Anorexia: Secondary | ICD-10-CM | POA: Diagnosis not present

## 2022-06-06 DIAGNOSIS — Z95828 Presence of other vascular implants and grafts: Secondary | ICD-10-CM

## 2022-06-06 DIAGNOSIS — C3492 Malignant neoplasm of unspecified part of left bronchus or lung: Secondary | ICD-10-CM

## 2022-06-06 DIAGNOSIS — Z7901 Long term (current) use of anticoagulants: Secondary | ICD-10-CM | POA: Insufficient documentation

## 2022-06-06 DIAGNOSIS — D649 Anemia, unspecified: Secondary | ICD-10-CM | POA: Diagnosis not present

## 2022-06-06 DIAGNOSIS — C3412 Malignant neoplasm of upper lobe, left bronchus or lung: Secondary | ICD-10-CM | POA: Diagnosis not present

## 2022-06-06 DIAGNOSIS — Z5111 Encounter for antineoplastic chemotherapy: Secondary | ICD-10-CM | POA: Insufficient documentation

## 2022-06-06 DIAGNOSIS — Z86711 Personal history of pulmonary embolism: Secondary | ICD-10-CM | POA: Insufficient documentation

## 2022-06-06 LAB — CBC WITH DIFFERENTIAL (CANCER CENTER ONLY)
Abs Immature Granulocytes: 0.02 10*3/uL (ref 0.00–0.07)
Basophils Absolute: 0 10*3/uL (ref 0.0–0.1)
Basophils Relative: 1 %
Eosinophils Absolute: 0.2 10*3/uL (ref 0.0–0.5)
Eosinophils Relative: 4 %
HCT: 28 % — ABNORMAL LOW (ref 36.0–46.0)
Hemoglobin: 9.4 g/dL — ABNORMAL LOW (ref 12.0–15.0)
Immature Granulocytes: 0 %
Lymphocytes Relative: 14 %
Lymphs Abs: 0.8 10*3/uL (ref 0.7–4.0)
MCH: 29.5 pg (ref 26.0–34.0)
MCHC: 33.6 g/dL (ref 30.0–36.0)
MCV: 87.8 fL (ref 80.0–100.0)
Monocytes Absolute: 0.4 10*3/uL (ref 0.1–1.0)
Monocytes Relative: 8 %
Neutro Abs: 4 10*3/uL (ref 1.7–7.7)
Neutrophils Relative %: 73 %
Platelet Count: 324 10*3/uL (ref 150–400)
RBC: 3.19 MIL/uL — ABNORMAL LOW (ref 3.87–5.11)
RDW: 14.8 % (ref 11.5–15.5)
WBC Count: 5.5 10*3/uL (ref 4.0–10.5)
nRBC: 0 % (ref 0.0–0.2)

## 2022-06-06 LAB — CMP (CANCER CENTER ONLY)
ALT: 8 U/L (ref 0–44)
AST: 10 U/L — ABNORMAL LOW (ref 15–41)
Albumin: 3.6 g/dL (ref 3.5–5.0)
Alkaline Phosphatase: 55 U/L (ref 38–126)
Anion gap: 7 (ref 5–15)
BUN: 23 mg/dL (ref 8–23)
CO2: 27 mmol/L (ref 22–32)
Calcium: 9.6 mg/dL (ref 8.9–10.3)
Chloride: 103 mmol/L (ref 98–111)
Creatinine: 1.26 mg/dL — ABNORMAL HIGH (ref 0.44–1.00)
GFR, Estimated: 45 mL/min — ABNORMAL LOW (ref >60)
Glucose, Bld: 85 mg/dL (ref 70–99)
Potassium: 4.4 mmol/L (ref 3.5–5.1)
Sodium: 137 mmol/L (ref 135–145)
Total Bilirubin: 0.4 mg/dL (ref 0.3–1.2)
Total Protein: 7.6 g/dL (ref 6.5–8.1)

## 2022-06-06 MED ORDER — SODIUM CHLORIDE 0.9 % IV SOLN: Freq: Once | INTRAVENOUS | Status: AC

## 2022-06-06 MED ORDER — SODIUM CHLORIDE 0.9 % IV SOLN
1500.0000 mg | Freq: Once | INTRAVENOUS | Status: AC
  Administered 2022-06-06: 1500 mg via INTRAVENOUS
  Filled 2022-06-06: qty 30

## 2022-06-06 MED ORDER — SODIUM CHLORIDE 0.9% FLUSH
10.0000 mL | INTRAVENOUS | Status: DC | PRN
  Administered 2022-06-06: 10 mL

## 2022-06-06 MED ORDER — SODIUM CHLORIDE 0.9% FLUSH
10.0000 mL | INTRAVENOUS | Status: AC | PRN
  Administered 2022-06-06: 10 mL

## 2022-06-06 MED ORDER — HEPARIN SOD (PORK) LOCK FLUSH 100 UNIT/ML IV SOLN
500.0000 [IU] | Freq: Once | INTRAVENOUS | Status: AC | PRN
  Administered 2022-06-06: 500 [IU]

## 2022-06-06 NOTE — Progress Notes (Signed)
[place] [phone]   [phone]  OFFICE PROGRESS NOTE  [name], [address] [place] [address]  DIAGNOSIS:  1) Stage IIIb (T1a, N3, M0) non-small cell lung cancer, adenosquamous carcinoma presented with left upper lobe pulmonary nodule in addition to left hilar, mediastinal and left supraclavicular and cervical lymphadenopathy diagnosed in September 2023. 2) incidental pulmonary embolism of segmental to subsegmental nonocclusive pulmonary embolism in the peripheral right lower lobe diagnosed in November 2023.  Detected Alteration(s) / Biomarker(s) Associated FDA-approved therapies Clinical Trial Availability % cfDNA or Amplification  TP53 R282W None Yes 1.6%  NFE2L2 D29V None Yes 1.2%  TP53 V216M None Yes 0.4%  TMB 18.02 mut/Mb  PDL1 Expression: 80%  PRIOR THERAPY: A course of concurrent chemoradiation with weekly carboplatin for AUC of 2 and paclitaxel 45 Mg/M2.  First dose March 11, 2022.  Status post 7 cycles of treatment last dose was given in April 15, 2022.   CURRENT THERAPY:  1) Consolidation treatment with immunotherapy with Imfinzi 1500 mg IV every 4 weeks.  First dose May 09, 2022.  Status post 1 cycle. 2) Eliquis starter kit with 10 mg p.o. twice daily and then 5 mg p.o. twice daily after that.  INTERVAL HISTORY: Yolanda Robertson 77 y.o. female returns to the clinic today for follow-up visit accompanied by her husband.  The patient is feeling fine today with no concerning complaints except for fatigue and feeling cold most of the time.  She is currently on Eliquis and tolerating it fairly well with no bleeding issues.  She also has lack of appetite and cough productive of whitish sputum.  She has no nausea, vomiting, diarrhea or constipation.  She has no headache or visual changes.  She she lost several pounds recently because of lack of appetite.  She tolerated the first dose of her consolidation  treatment with immunotherapy fairly well.  She is here today for evaluation before starting cycle #2.  MEDICAL HISTORY: Past Medical History:  Diagnosis Date   Hyperlipidemia    Hypertension    Lung mass    left upper lobe    ALLERGIES:  is allergic to no known allergies.  MEDICATIONS:  Current Outpatient Medications  Medication Sig Dispense Refill   apixaban (ELIQUIS) 5 MG TABS tablet Take 1 tablet (5 mg total) by mouth 2 (two) times daily. 60 tablet 1   APIXABAN (ELIQUIS) VTE STARTER PACK (10MG  AND 5MG ) Take as directed on package: start with two-5mg  tablets twice daily for 7 days. On day 8, switch to one-5mg  tablet twice daily. 1 each 0   benzonatate (TESSALON) 200 MG capsule Take 1 capsule (200 mg total) by mouth 3 (three) times daily as needed for cough. 30 capsule 1   ibuprofen (ADVIL) 200 MG tablet Take 200 mg by mouth every 8 (eight) hours as needed (pain.).     indapamide (LOZOL) 1.25 MG tablet Take 1.25 mg by mouth in the morning. (Patient not taking: Reported on 05/17/2022)     lidocaine (XYLOCAINE) 2 % solution Patient: Mix 1part 2% viscous lidocaine, 1part H20. Swallow 47mL of diluted mixture, 21min before meals and at bedtime, up to QID prn soreness (Patient not taking: Reported on 05/17/2022) 200 mL 3   lidocaine-prilocaine (EMLA) cream Apply 1 Application topically as needed. 30 g 2   Polyvinyl Alcohol-Povidone PF (REFRESH) 1.4-0.6 % SOLN Place 1-2 drops into both eyes 3 (three) times daily as needed (dry/irritated eyes.).  prochlorperazine (COMPAZINE) 10 MG tablet Take 1 tablet (10 mg total) by mouth every 6 (six) hours as needed for nausea or vomiting. (Patient not taking: Reported on 05/17/2022) 30 tablet 0   rosuvastatin (CRESTOR) 10 MG tablet Take 10 mg by mouth in the morning.     No current facility-administered medications for this visit.    SURGICAL HISTORY:  Past Surgical History:  Procedure Laterality Date   FINE NEEDLE ASPIRATION  02/12/2022    Procedure: FINE NEEDLE ASPIRATION (FNA) LINEAR;  Surgeon: Garner Nash, DO;  Location: [facility];  Service: Pulmonary;;   IR IMAGING GUIDED PORT INSERTION  05/28/2022   VAGINAL HYSTERECTOMY     VIDEO BRONCHOSCOPY WITH ENDOBRONCHIAL ULTRASOUND Left 02/12/2022   Procedure: VIDEO BRONCHOSCOPY WITH ENDOBRONCHIAL ULTRASOUND;  Surgeon: Garner Nash, DO;  Location: [facility];  Service: Pulmonary;  Laterality: Left;    REVIEW OF SYSTEMS:  A comprehensive review of systems was negative except for: Constitutional: positive for anorexia, fatigue, and weight loss Respiratory: positive for cough   PHYSICAL EXAMINATION: General appearance: alert, cooperative, fatigued, and no distress Head: Normocephalic, without obvious abnormality, atraumatic Neck: no adenopathy, no JVD, supple, symmetrical, trachea midline, and thyroid not enlarged, symmetric, no tenderness/mass/nodules Lymph nodes: Cervical, supraclavicular, and axillary nodes normal. Resp: clear to auscultation bilaterally Back: symmetric, no curvature. ROM normal. No CVA tenderness. Cardio: regular rate and rhythm, S1, S2 normal, no murmur, click, rub or gallop GI: soft, non-tender; bowel sounds normal; no masses,  no organomegaly Extremities: extremities normal, atraumatic, no cyanosis or edema  ECOG PERFORMANCE STATUS: 1 - Symptomatic but completely ambulatory  Blood pressure 108/69, pulse 82, temperature 98 F (36.7 C), temperature source Oral, resp. rate 15, weight 170 lb 14.4 oz (77.5 kg), SpO2 94 %.  LABORATORY DATA: Lab Results  Component Value Date   WBC 5.5 06/06/2022   HGB 9.4 (L) 06/06/2022   HCT 28.0 (L) 06/06/2022   MCV 87.8 06/06/2022   PLT 324 06/06/2022      Chemistry      Component Value Date/Time   NA 137 06/06/2022 0943   K 4.4 06/06/2022 0943   CL 103 06/06/2022 0943   CO2 27 06/06/2022 0943   BUN 23 06/06/2022 0943   CREATININE 1.26 (H) 06/06/2022 0943      Component Value Date/Time   CALCIUM 9.6  06/06/2022 0943   ALKPHOS 55 06/06/2022 0943   AST 10 (L) 06/06/2022 0943   ALT 8 06/06/2022 0943   BILITOT 0.4 06/06/2022 0943       RADIOGRAPHIC STUDIES: IR IMAGING GUIDED PORT INSERTION  Result Date: 05/28/2022 INDICATION: 77 year old female referred for port catheter EXAM: IMAGE GUIDED PORT CATHETER MEDICATIONS: None ANESTHESIA/SEDATION: Moderate (conscious) sedation was employed during this procedure. A total of Versed 2.0 mg and Fentanyl 100 mcg was administered intravenously. Moderate Sedation Time: 26 minutes. The patient's level of consciousness and vital signs were monitored continuously by radiology nursing throughout the procedure under my direct supervision. FLUOROSCOPY TIME:  Fluoroscopy Time: 0 minutes 12 seconds (1 mGy). COMPLICATIONS: None PROCEDURE: Informed written consent was obtained from the patient after a thorough discussion of the procedural risks, benefits and alternatives. All questions were addressed. Maximal Sterile Barrier Technique was utilized including caps, mask, sterile gowns, sterile gloves, sterile drape, hand hygiene and skin antiseptic. A timeout was performed prior to the initiation of the procedure. Ultrasound survey was performed with images stored and sent to PACs. Right IJ vein documented to be patent. The right neck and chest was  prepped with chlorhexidine, and draped in the usual sterile fashion using maximum barrier technique (cap and mask, sterile gown, sterile gloves, large sterile sheet, hand hygiene and cutaneous antiseptic). Local anesthesia was attained by infiltration with 1% lidocaine without epinephrine. Ultrasound demonstrated patency of the right internal jugular vein, and this was documented with an image. Under real-time ultrasound guidance, this vein was accessed with a 21 gauge micropuncture needle and image documentation was performed. A small dermatotomy was made at the access site with an 11 scalpel. A 0.018" wire was advanced into the SVC  and used to estimate the length of the internal catheter. The access needle exchanged for a 64F micropuncture vascular sheath. The 0.018" wire was then removed and a 0.035" wire advanced into the IVC. An appropriate location for the subcutaneous reservoir was selected below the clavicle and an incision was made through the skin and underlying soft tissues. The subcutaneous tissues were then dissected using a combination of blunt and sharp surgical technique and a pocket was formed. A single lumen power injectable portacatheter was then tunneled through the subcutaneous tissues from the pocket to the dermatotomy and the port reservoir placed within the subcutaneous pocket. The venous access site was then serially dilated and a peel away vascular sheath placed over the wire. The wire was removed and the port catheter advanced into position under fluoroscopic guidance. The catheter tip is positioned in the cavoatrial junction. This was documented with a spot image. The portacatheter was then tested and found to flush and aspirate well. The port was flushed with saline followed by 100 units/mL heparinized saline. The pocket was then closed in two layers using first subdermal inverted interrupted absorbable sutures followed by a running subcuticular suture. The epidermis was then sealed with Dermabond. The dermatotomy at the venous access site was also seal with Dermabond. Patient tolerated the procedure well and remained hemodynamically stable throughout. No complications encountered and no significant blood loss encountered IMPRESSION: Status post right IJ port catheter. Signed, Dulcy Fanny. Nadene Rubins, RPVI [facility] Electronically Signed   By: Corrie Mckusick D.O.   On: 05/28/2022 14:48    ASSESSMENT AND PLAN: This is a very pleasant 77 years old African-American female with Stage IIIb (T1a, N3, M0) non-small cell lung cancer, adenosquamous carcinoma  presented with left upper lobe pulmonary nodule in addition to left hilar, mediastinal and left supraclavicular and cervical lymphadenopathy diagnosed in September 2023. Molecular studies showed no actionable mutations and PD-L1 expression is 80%. The patient also had bronchoscopy by Dr. Valeta Harms and the final pathology was consistent with adenosquamous carcinoma from the left upper lobe lung nodule. 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 The patient underwent a course of concurrent chemoradiation with weekly carboplatin for AUC of 2 and paclitaxel 45 Mg/M2.  First dose March 11, 2022.  Status post 7 cycles the last dose was given April 15, 2022. The patient is currently undergoing consolidation treatment with immunotherapy with Imfinzi 1500 Mg IV every 4 weeks status post 1 cycle. She tolerated the first cycle of her treatment fairly well with no concerning adverse effects. I recommended for her to proceed with cycle #2 today as planned. For the anemia, I advised her to start taking over-the-counter oral ferrous sulfate once daily with vitamin C. For the history of pulmonary embolism, she will continue her current treatment with Eliquis. The patient will come  back for follow-up visit in 4 weeks for evaluation before the next cycle of her treatment. She was advised to call immediately if she has any other concerning symptoms in the interval.  The patient voices understanding of current disease status and treatment options and is in agreement with the current care plan.  All questions were answered. The patient knows to call the clinic with any problems, questions or concerns. We can certainly see the patient much sooner if necessary.  The total time spent in the appointment was 20 minutes.  Disclaimer: This note was dictated with voice recognition software. Similar sounding words can  inadvertently be transcribed and may not be corrected upon review.

## 2022-06-06 NOTE — Patient Instructions (Signed)
[facility]  Discharge Instructions: Thank you for choosing [facility] to provide your oncology and hematology care.   If you have a lab appointment with the [facility], please go directly to the [facility] and check in at the registration area.   Wear comfortable clothing and clothing appropriate for easy access to any Portacath or PICC line.   We strive to give you quality time with your provider. You may need to reschedule your appointment if you arrive late (15 or more minutes).  Arriving late affects you and other patients whose appointments are after yours.  Also, if you miss three or more appointments without notifying the office, you may be dismissed from the clinic at the provider's discretion.      For prescription refill requests, have your pharmacy contact our office and allow 72 hours for refills to be completed.    Today you received the following chemotherapy and/or immunotherapy agents durvalumab.      To help prevent nausea and vomiting after your treatment, we encourage you to take your nausea medication as directed.  BELOW ARE SYMPTOMS THAT SHOULD BE REPORTED IMMEDIATELY: *FEVER GREATER THAN 100.4 F (38 C) OR HIGHER *CHILLS OR SWEATING *NAUSEA AND VOMITING THAT IS NOT CONTROLLED WITH YOUR NAUSEA MEDICATION *UNUSUAL SHORTNESS OF BREATH *UNUSUAL BRUISING OR BLEEDING *URINARY PROBLEMS (pain or burning when urinating, or frequent urination) *BOWEL PROBLEMS (unusual diarrhea, constipation, pain near the anus) TENDERNESS IN MOUTH AND THROAT WITH OR WITHOUT PRESENCE OF ULCERS (sore throat, sores in mouth, or a toothache) UNUSUAL RASH, SWELLING OR PAIN  UNUSUAL VAGINAL DISCHARGE OR ITCHING   Items with * indicate a potential emergency and should be followed up as soon as possible or go to the Emergency Department if any problems should occur.  Please show the CHEMOTHERAPY ALERT CARD or IMMUNOTHERAPY ALERT CARD at check-in to  the Emergency Department and triage nurse.  Should you have questions after your visit or need to cancel or reschedule your appointment, please contact [facility]  Dept: [phone]  and follow the prompts.  Office hours are 8:00 a.m. to 4:30 p.m. Monday - Friday. Please note that voicemails left after 4:00 p.m. may not be returned until the following business day.  We are closed weekends and major holidays. You have access to a nurse at all times for urgent questions. Please call the main number to the clinic Dept: [phone] and follow the prompts.   For any non-urgent questions, you may also contact your provider using MyChart. We now offer e-Visits for anyone 34 and older to request care online for non-urgent symptoms. For details visit [email or web address].   Also download the MyChart app! Go to the app store, search "MyChart", open the app, select [facility], and log in with your MyChart username and password.

## 2022-06-18 ENCOUNTER — Other Ambulatory Visit: Payer: Self-pay | Admitting: Medical Oncology

## 2022-06-18 DIAGNOSIS — I2699 Other pulmonary embolism without acute cor pulmonale: Secondary | ICD-10-CM

## 2022-06-18 MED ORDER — APIXABAN 5 MG PO TABS: 5.0000 mg | ORAL_TABLET | Freq: Two times a day (BID) | ORAL | 1 refills | Status: DC

## 2022-06-24 ENCOUNTER — Ambulatory Visit (HOSPITAL_COMMUNITY)
Admission: RE | Admit: 2022-06-24 | Discharge: 2022-06-24 | Disposition: A | Discharge status: Home / Self Care | Payer: Medicare Other | Source: Ambulatory Visit | Attending: Pulmonary Disease | Admitting: Pulmonary Disease

## 2022-06-24 ENCOUNTER — Encounter: Payer: Self-pay | Admitting: Pulmonary Disease

## 2022-06-24 ENCOUNTER — Ambulatory Visit: Payer: Medicare Other | Admitting: Pulmonary Disease

## 2022-06-24 VITALS — BP 110/70 | HR 76 | Ht 64.0 in | Wt 170.6 lb

## 2022-06-24 DIAGNOSIS — Z2989 Encounter for other specified prophylactic measures: Secondary | ICD-10-CM | POA: Diagnosis not present

## 2022-06-24 DIAGNOSIS — R0602 Shortness of breath: Secondary | ICD-10-CM | POA: Diagnosis not present

## 2022-06-24 DIAGNOSIS — R918 Other nonspecific abnormal finding of lung field: Secondary | ICD-10-CM | POA: Diagnosis not present

## 2022-06-24 DIAGNOSIS — J479 Bronchiectasis, uncomplicated: Secondary | ICD-10-CM | POA: Diagnosis not present

## 2022-06-24 DIAGNOSIS — R052 Subacute cough: Secondary | ICD-10-CM | POA: Diagnosis not present

## 2022-06-24 DIAGNOSIS — I700 Atherosclerosis of aorta: Secondary | ICD-10-CM | POA: Diagnosis not present

## 2022-06-24 DIAGNOSIS — C3492 Malignant neoplasm of unspecified part of left bronchus or lung: Secondary | ICD-10-CM

## 2022-06-24 MED ORDER — BENZONATATE 200 MG PO CAPS: 200.0000 mg | ORAL_CAPSULE | Freq: Three times a day (TID) | ORAL | 2 refills | Status: DC | PRN

## 2022-06-24 NOTE — Addendum Note (Signed)
Addended by: Alvin Critchley on: 06/24/2022 02:49 PM   Modules accepted: Orders

## 2022-06-24 NOTE — Progress Notes (Signed)
Synopsis: Referred in August 2023 for lung nodule by [name], MD  Subjective:   PATIENT ID: Yolanda Robertson: female DOB: May 14, 1946, MRN: [id number]  Chief Complaint  Patient presents with   Follow-up    F/up    This is a 77 year old female past medical history of hypertension, hyperlipidemia.Patient had a lung cancer screening CT completed on 12/17/2021 which was read as a lung RADS 4X.  Patient was found to have a irregular nodule within the left upper lobe at 11.5 mm.  Was also found to have a AP window lymph node measuring 2.1 cm.  Patient also complaining today of enlarged lymph node in her left neck.  OV 02/06/2022: Here today for follow-up.  Patient had a recent PET CT which revealed hypermetabolic nodes in the cervical chain as well as hypermetabolic lesions within the chest.  We reviewed her PET scan imaging today.  OV 06/24/2022: Here today for follow-up.  She is currently undergoing treatments for non-small cell lung cancer with Dr. Earlie Server.  She completed her radiation and chemotherapy.  She started on Imfinzi.  She has had 2 cycles of Imfinzi however for the past 6 weeks she has had increased cough and shortness of breath.  Is been progressive.  And she feels like it has been nonproductive.  Not coughing up any blood.  She has not had any other sick symptoms but her shortness of breath is worse.    Past Medical History:  Diagnosis Date   Hyperlipidemia    Hypertension    Lung mass    left upper lobe     No family history on file.   Past Surgical History:  Procedure Laterality Date   FINE NEEDLE ASPIRATION  02/12/2022   Procedure: FINE NEEDLE ASPIRATION (FNA) LINEAR;  Surgeon: Garner Nash, DO;  Location: [facility];  Service: Pulmonary;;   IR IMAGING GUIDED PORT INSERTION  05/28/2022   VAGINAL HYSTERECTOMY     VIDEO BRONCHOSCOPY WITH ENDOBRONCHIAL ULTRASOUND Left 02/12/2022   Procedure: VIDEO BRONCHOSCOPY WITH ENDOBRONCHIAL ULTRASOUND;  Surgeon:  Garner Nash, DO;  Location: [facility];  Service: Pulmonary;  Laterality: Left;    Social History   Socioeconomic History   Marital status: Married    Spouse name: Meda Coffee   Number of children: 4   Years of education: Not on file   Highest education level: Not on file  Occupational History   Not on file  Tobacco Use   Smoking status: Former    Packs/day: 1.00    Types: Cigarettes    Quit date: 12/23/2021    Years since quitting: 0.5    Passive exposure: Past   Smokeless tobacco: Never  Vaping Use   Vaping Use: Never used  Substance and Sexual Activity   Alcohol use: Never   Drug use: Never   Sexual activity: Yes  Other Topics Concern   Not on file  Social History Narrative   Not on file   Social Determinants of Health   Financial Resource Strain: Not on file  Food Insecurity: Not on file  Transportation Needs: Not on file  Physical Activity: Not on file  Stress: Not on file  Social Connections: Not on file  Intimate Partner Violence: Not on file     Allergies  Allergen Reactions   No Known Allergies      Outpatient Medications Prior to Visit  Medication Sig Dispense Refill   apixaban (ELIQUIS) 5 MG TABS tablet Take 1 tablet (5 mg total) by  mouth 2 (two) times daily. 60 tablet 1   benzonatate (TESSALON) 200 MG capsule Take 1 capsule (200 mg total) by mouth 3 (three) times daily as needed for cough. (Patient not taking: Reported on 06/24/2022) 30 capsule 1   ibuprofen (ADVIL) 200 MG tablet Take 200 mg by mouth every 8 (eight) hours as needed (pain.). (Patient not taking: Reported on 06/06/2022)     indapamide (LOZOL) 1.25 MG tablet Take 1.25 mg by mouth in the morning.     lidocaine (XYLOCAINE) 2 % solution Patient: Mix 1part 2% viscous lidocaine, 1part H20. Swallow 7mL of diluted mixture, 65min before meals and at bedtime, up to QID prn soreness (Patient not taking: Reported on 05/17/2022) 200 mL 3   lidocaine-prilocaine (EMLA) cream Apply 1 Application  topically as needed. 30 g 2   Polyvinyl Alcohol-Povidone PF (REFRESH) 1.4-0.6 % SOLN Place 1-2 drops into both eyes 3 (three) times daily as needed (dry/irritated eyes.).     prochlorperazine (COMPAZINE) 10 MG tablet Take 1 tablet (10 mg total) by mouth every 6 (six) hours as needed for nausea or vomiting. (Patient not taking: Reported on 05/17/2022) 30 tablet 0   rosuvastatin (CRESTOR) 10 MG tablet Take 10 mg by mouth in the morning.     No facility-administered medications prior to visit.    Review of Systems  Constitutional:  Negative for chills, fever, malaise/fatigue and weight loss.  HENT:  Negative for hearing loss, sore throat and tinnitus.   Eyes:  Negative for blurred vision and double vision.  Respiratory:  Positive for cough and shortness of breath. Negative for hemoptysis, sputum production, wheezing and stridor.   Cardiovascular:  Negative for chest pain, palpitations, orthopnea, leg swelling and PND.  Gastrointestinal:  Negative for abdominal pain, constipation, diarrhea, heartburn, nausea and vomiting.  Genitourinary:  Negative for dysuria, hematuria and urgency.  Musculoskeletal:  Negative for joint pain and myalgias.  Skin:  Negative for itching and rash.  Neurological:  Negative for dizziness, tingling, weakness and headaches.  Endo/Heme/Allergies:  Negative for environmental allergies. Does not bruise/bleed easily.  Psychiatric/Behavioral:  Negative for depression. The patient is not nervous/anxious and does not have insomnia.   All other systems reviewed and are negative.    Objective:  Physical Exam Vitals reviewed.  Constitutional:      General: She is not in acute distress.    Appearance: She is well-developed.  HENT:     Head: Normocephalic and atraumatic.  Eyes:     General: No scleral icterus.    Conjunctiva/sclera: Conjunctivae normal.     Pupils: Pupils are equal, round, and reactive to light.  Neck:     Vascular: No JVD.     Trachea: No tracheal  deviation.  Cardiovascular:     Rate and Rhythm: Normal rate and regular rhythm.     Heart sounds: Normal heart sounds. No murmur heard. Pulmonary:     Effort: Pulmonary effort is normal. No tachypnea, accessory muscle usage or respiratory distress.     Breath sounds: No stridor. Rhonchi present. No wheezing or rales.  Abdominal:     General: There is no distension.     Palpations: Abdomen is soft.     Tenderness: There is no abdominal tenderness.  Musculoskeletal:        General: No tenderness.     Cervical back: Neck supple.  Lymphadenopathy:     Cervical: Cervical adenopathy present.  Skin:    General: Skin is warm and dry.     Capillary Refill:  Capillary refill takes less than 2 seconds.     Findings: No rash.  Neurological:     Mental Status: She is alert and oriented to person, place, and time.  Psychiatric:        Behavior: Behavior normal.      Vitals:   06/24/22 1405  BP: 110/70  Pulse: 76  SpO2: 98%  Weight: 170 lb 9.6 oz (77.4 kg)  Height: 5\' 4"  (1.626 m)   98% on RA BMI Readings from Last 3 Encounters:  06/24/22 29.28 kg/m  06/06/22 29.33 kg/m  05/17/22 30.11 kg/m   Wt Readings from Last 3 Encounters:  06/24/22 170 lb 9.6 oz (77.4 kg)  06/06/22 170 lb 14.4 oz (77.5 kg)  05/17/22 175 lb 6.4 oz (79.6 kg)     CBC    Component Value Date/Time   WBC 5.5 06/06/2022 0943   WBC 5.3 02/20/2022 1101   RBC 3.19 (L) 06/06/2022 0943   HGB 9.4 (L) 06/06/2022 0943   HCT 28.0 (L) 06/06/2022 0943   PLT 324 06/06/2022 0943   MCV 87.8 06/06/2022 0943   MCH 29.5 06/06/2022 0943   MCHC 33.6 06/06/2022 0943   RDW 14.8 06/06/2022 0943   LYMPHSABS 0.8 06/06/2022 0943   MONOABS 0.4 06/06/2022 0943   EOSABS 0.2 06/06/2022 0943   BASOSABS 0.0 06/06/2022 0943    Chest Imaging:  July 2023 lung cancer screening CT: AP window adenopathy, lung nodule in the left upper lobe Evidence of emphysema  Nuclear medicine pet imaging: Hypermetabolic cervical lymph node  as well as hypermetabolic lesions within the chest. The patient's images have been independently reviewed by me.    Pulmonary Functions Testing Results:     No data to display          FeNO:   Pathology:   Echocardiogram:   Heart Catheterization:     Assessment & Plan:     ICD-10-CM   1. Subacute cough  R05.2 CT CHEST HIGH RESOLUTION    2. SOB (shortness of breath)  R06.02 CT CHEST HIGH RESOLUTION    3. Immunotherapy  Z29.89     4. Non-small cell carcinoma of left lung, stage 3 (HCC)  C34.92       Discussion:  This is a 77 year old female, left upper lobe mass enlarged AP window node, enlarged cervical left node diagnosed with a stage III non-small cell lung cancer.  Plan: Currently undergoing chemotherapy radiation which she has completed 2 doses of immune therapy. She has had increasing cough and shortness of breath for the past 6 weeks. We will order a stat/ASAP HRCT chest to look for any drug-induced ILD.  Hopefully we get this CT done within the next 24 to 48 hours.  I would like to see her back in clinic on Thursday to come up with a plan for next steps. If she has evidence of ILD from the [place] we would start immune suppressant with prednisone. I let her oncologist know about my concern.  RTC on Thursday of this week.    Current Outpatient Medications:    apixaban (ELIQUIS) 5 MG TABS tablet, Take 1 tablet (5 mg total) by mouth 2 (two) times daily., Disp: 60 tablet, Rfl: 1   benzonatate (TESSALON) 200 MG capsule, Take 1 capsule (200 mg total) by mouth 3 (three) times daily as needed for cough. (Patient not taking: Reported on 06/24/2022), Disp: 30 capsule, Rfl: 1   ibuprofen (ADVIL) 200 MG tablet, Take 200 mg by mouth every 8 (eight)  hours as needed (pain.). (Patient not taking: Reported on 06/06/2022), Disp: , Rfl:    indapamide (LOZOL) 1.25 MG tablet, Take 1.25 mg by mouth in the morning., Disp: , Rfl:    lidocaine (XYLOCAINE) 2 % solution, Patient: Mix  1part 2% viscous lidocaine, 1part H20. Swallow 85mL of diluted mixture, 4min before meals and at bedtime, up to QID prn soreness (Patient not taking: Reported on 05/17/2022), Disp: 200 mL, Rfl: 3   lidocaine-prilocaine (EMLA) cream, Apply 1 Application topically as needed., Disp: 30 g, Rfl: 2   Polyvinyl Alcohol-Povidone PF (REFRESH) 1.4-0.6 % SOLN, Place 1-2 drops into both eyes 3 (three) times daily as needed (dry/irritated eyes.)., Disp: , Rfl:    prochlorperazine (COMPAZINE) 10 MG tablet, Take 1 tablet (10 mg total) by mouth every 6 (six) hours as needed for nausea or vomiting. (Patient not taking: Reported on 05/17/2022), Disp: 30 tablet, Rfl: 0   rosuvastatin (CRESTOR) 10 MG tablet, Take 10 mg by mouth in the morning., Disp: , Rfl:    I spent 43 minutes dedicated to the care of this patient on the date of this encounter to include pre-visit review of records, face-to-face time with the patient discussing conditions above, post visit ordering of testing, clinical documentation with the electronic health record, making appropriate referrals as documented, and communicating necessary findings to members of the patients care team.    Garner Nash, DO [facility] 06/24/2022 2:24 PM

## 2022-06-24 NOTE — Patient Instructions (Signed)
Thank you for visiting Dr. Valeta Harms at [facility]. Today we recommend the following:  Orders Placed This Encounter  Procedures   CT CHEST HIGH RESOLUTION   Please get her HRCT complete within the next 48 hours. I will see her back in clinic on Thursday to come up with next plan. I have communicated my concern to the oncology services.  Return in about 3 days (around 06/27/2022) for w/ Arilyn Brierley .    Please do your part to reduce the spread of COVID-19.

## 2022-06-25 DIAGNOSIS — E7849 Other hyperlipidemia: Secondary | ICD-10-CM | POA: Diagnosis not present

## 2022-06-25 DIAGNOSIS — I10 Essential (primary) hypertension: Secondary | ICD-10-CM | POA: Diagnosis not present

## 2022-06-27 ENCOUNTER — Telehealth: Payer: Self-pay | Admitting: Pulmonary Disease

## 2022-06-27 ENCOUNTER — Ambulatory Visit: Payer: Medicare Other | Admitting: Pulmonary Disease

## 2022-06-27 ENCOUNTER — Encounter: Payer: Self-pay | Admitting: Pulmonary Disease

## 2022-06-27 VITALS — BP 100/70 | HR 88 | Ht 64.0 in | Wt 170.0 lb

## 2022-06-27 DIAGNOSIS — R052 Subacute cough: Secondary | ICD-10-CM | POA: Diagnosis not present

## 2022-06-27 DIAGNOSIS — J700 Acute pulmonary manifestations due to radiation: Secondary | ICD-10-CM | POA: Diagnosis not present

## 2022-06-27 DIAGNOSIS — C3492 Malignant neoplasm of unspecified part of left bronchus or lung: Secondary | ICD-10-CM

## 2022-06-27 DIAGNOSIS — Z2989 Encounter for other specified prophylactic measures: Secondary | ICD-10-CM | POA: Diagnosis not present

## 2022-06-27 DIAGNOSIS — R0602 Shortness of breath: Secondary | ICD-10-CM | POA: Diagnosis not present

## 2022-06-27 MED ORDER — PREDNISONE 20 MG PO TABS: 60.0000 mg | ORAL_TABLET | Freq: Every day | ORAL | 0 refills | Status: AC

## 2022-06-27 MED ORDER — PREDNISONE 20 MG PO TABS: 60.0000 mg | ORAL_TABLET | Freq: Every day | ORAL | 0 refills | Status: DC

## 2022-06-27 NOTE — Telephone Encounter (Signed)
Rx for pt's prednisone sent to [facility] at pt's request. Called and spoke with pt letting her know this had been done and she verbalized understanding. Nothing further needed.

## 2022-06-27 NOTE — Patient Instructions (Signed)
Thank you for visiting Dr. Valeta Harms at [facility]. Today we recommend the following:  Meds ordered this encounter  Medications   predniSONE (DELTASONE) 20 MG tablet    Sig: Take 3 tablets (60 mg total) by mouth daily with breakfast.    Dispense:  180 tablet    Refill:  0   Return in about 3 months (around 09/25/2022) for with APP or Dr. Valeta Harms.    Please do your part to reduce the spread of COVID-19.

## 2022-06-27 NOTE — Progress Notes (Signed)
Synopsis: Referred in August 2023 for lung nodule by [name], MD  Subjective:   PATIENT ID: Yolanda Robertson: female DOB: June 06, 1945, MRN: [id number]  Chief Complaint  Patient presents with   Follow-up    F/up CT    This is a 77 year old female past medical history of hypertension, hyperlipidemia.Patient had a lung cancer screening CT completed on 12/17/2021 which was read as a lung RADS 4X.  Patient was found to have a irregular nodule within the left upper lobe at 11.5 mm.  Was also found to have a AP window lymph node measuring 2.1 cm.  Patient also complaining today of enlarged lymph node in her left neck.  OV 02/06/2022: Here today for follow-up.  Patient had a recent PET CT which revealed hypermetabolic nodes in the cervical chain as well as hypermetabolic lesions within the chest.  We reviewed her PET scan imaging today.  OV 06/24/2022: Here today for follow-up.  She is currently undergoing treatments for non-small cell lung cancer with Dr. Earlie Server.  She completed her radiation and chemotherapy.  She started on Imfinzi.  She has had 2 cycles of Imfinzi however for the past 6 weeks she has had increased cough and shortness of breath.  Is been progressive.  And she feels like it has been nonproductive.  Not coughing up any blood.  She has not had any other sick symptoms but her shortness of breath is worse.  OV 06/27/2022: Here today for follow-up after being seen in the office with increased shortness of breath.  Was sent for imaging evaluation.  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  She also has irregular interstitial opacities throughout the lung consistent with inflammation.    Past Medical History:  Diagnosis Date   Hyperlipidemia    Hypertension    Lung mass    left upper lobe      No family history on file.   Past Surgical History:  Procedure Laterality Date   FINE NEEDLE ASPIRATION  02/12/2022   Procedure: FINE NEEDLE ASPIRATION (FNA) LINEAR;  Surgeon: Garner Nash, DO;  Location: [facility];  Service: Pulmonary;;   IR IMAGING GUIDED PORT INSERTION  05/28/2022   VAGINAL HYSTERECTOMY     VIDEO BRONCHOSCOPY WITH ENDOBRONCHIAL ULTRASOUND Left 02/12/2022   Procedure: VIDEO BRONCHOSCOPY WITH ENDOBRONCHIAL ULTRASOUND;  Surgeon: Garner Nash, DO;  Location: [facility];  Service: Pulmonary;  Laterality: Left;    Social History   Socioeconomic History   Marital status: Married    Spouse name: Meda Coffee   Number of children: 4   Years of education: Not on file   Highest education level: Not on file  Occupational History   Not on file  Tobacco Use   Smoking status: Former    Packs/day: 1.00    Types: Cigarettes    Quit date: 12/23/2021    Years since quitting: 0.5    Passive exposure: Past   Smokeless tobacco: Never  Vaping Use   Vaping Use: Never used  Substance and Sexual Activity   Alcohol use: Never   Drug use: Never   Sexual activity: Yes  Other Topics Concern   Not on file  Social History Narrative   Not on file   Social Determinants of Health   Financial Resource Strain: Not on file  Food Insecurity: Not on file  Transportation Needs: Not on file  Physical Activity: Not on file  Stress: Not on file  Social Connections: Not on file  Intimate Partner Violence: Not on file     Allergies  Allergen Reactions   No Known Allergies      Outpatient Medications Prior to Visit  Medication Sig Dispense Refill   benzonatate (TESSALON) 200 MG capsule Take 1 capsule (200 mg total) by mouth 3 (three) times daily as needed for cough. 90 capsule 2   apixaban (ELIQUIS) 5 MG TABS tablet Take 1 tablet (5 mg total) by mouth 2 (two) times daily. 60 tablet 1   ibuprofen (ADVIL) 200 MG tablet Take 200 mg by mouth every 8 (eight) hours as needed  (pain.). (Patient not taking: Reported on 06/06/2022)     indapamide (LOZOL) 1.25 MG tablet Take 1.25 mg by mouth in the morning.     lidocaine (XYLOCAINE) 2 % solution Patient: Mix 1part 2% viscous lidocaine, 1part H20. Swallow 55mL of diluted mixture, 84min before meals and at bedtime, up to QID prn soreness (Patient not taking: Reported on 05/17/2022) 200 mL 3   lidocaine-prilocaine (EMLA) cream Apply 1 Application topically as needed. 30 g 2   Polyvinyl Alcohol-Povidone PF (REFRESH) 1.4-0.6 % SOLN Place 1-2 drops into both eyes 3 (three) times daily as needed (dry/irritated eyes.).     prochlorperazine (COMPAZINE) 10 MG tablet Take 1 tablet (10 mg total) by mouth every 6 (six) hours as needed for nausea or vomiting. (Patient not taking: Reported on 05/17/2022) 30 tablet 0   rosuvastatin (CRESTOR) 10 MG tablet Take 10 mg by mouth in the morning.     No facility-administered medications prior to visit.    Review of Systems  Constitutional:  Negative for chills, fever, malaise/fatigue and weight loss.  HENT:  Negative for hearing loss, sore throat and tinnitus.   Eyes:  Negative for blurred vision and double vision.  Respiratory:  Positive for cough, sputum production and shortness of breath. Negative for hemoptysis, wheezing and stridor.   Cardiovascular:  Negative for chest pain, palpitations, orthopnea, leg swelling and PND.  Gastrointestinal:  Negative for abdominal pain, constipation, diarrhea, heartburn, nausea and vomiting.  Genitourinary:  Negative for dysuria, hematuria and urgency.  Musculoskeletal:  Negative for joint pain and myalgias.  Skin:  Negative for itching and rash.  Neurological:  Negative for dizziness, tingling, weakness and headaches.  Endo/Heme/Allergies:  Negative for environmental allergies. Does not bruise/bleed easily.  Psychiatric/Behavioral:  Negative for depression. The patient is not nervous/anxious and does not have insomnia.   All other systems reviewed and  are negative.    Objective:  Physical Exam Vitals reviewed.  Constitutional:      General: She is not in acute distress.    Appearance: She is well-developed.  HENT:     Head: Normocephalic and atraumatic.  Eyes:     General: No scleral icterus.    Conjunctiva/sclera: Conjunctivae normal.     Pupils: Pupils are equal, round, and reactive to light.  Neck:     Vascular: No JVD.     Trachea: No tracheal deviation.  Cardiovascular:     Rate and Rhythm: Normal rate and regular rhythm.     Heart sounds: Normal heart sounds. No murmur heard. Pulmonary:     Effort: Pulmonary effort is normal. No tachypnea, accessory muscle usage or respiratory distress.     Breath sounds: No stridor. Wheezing and rhonchi present. No rales.  Abdominal:     General: There is no distension.     Palpations:  Abdomen is soft.     Tenderness: There is no abdominal tenderness.  Musculoskeletal:        General: No tenderness.     Cervical back: Neck supple.  Lymphadenopathy:     Cervical: No cervical adenopathy.  Skin:    General: Skin is warm and dry.     Capillary Refill: Capillary refill takes less than 2 seconds.     Findings: No rash.  Neurological:     Mental Status: She is alert and oriented to person, place, and time.  Psychiatric:        Behavior: Behavior normal.      Vitals:   06/27/22 1355  BP: 100/70  Pulse: 88  SpO2: 96%  Weight: 170 lb (77.1 kg)  Height: 5\' 4"  (1.626 m)   96% on RA BMI Readings from Last 3 Encounters:  06/27/22 29.18 kg/m  06/24/22 29.28 kg/m  06/06/22 29.33 kg/m   Wt Readings from Last 3 Encounters:  06/27/22 170 lb (77.1 kg)  06/24/22 170 lb 9.6 oz (77.4 kg)  06/06/22 170 lb 14.4 oz (77.5 kg)     CBC    Component Value Date/Time   WBC 5.5 06/06/2022 0943   WBC 5.3 02/20/2022 1101   RBC 3.19 (L) 06/06/2022 0943   HGB 9.4 (L) 06/06/2022 0943   HCT 28.0 (L) 06/06/2022 0943   PLT 324 06/06/2022 0943   MCV 87.8 06/06/2022 0943   MCH 29.5  06/06/2022 0943   MCHC 33.6 06/06/2022 0943   RDW 14.8 06/06/2022 0943   LYMPHSABS 0.8 06/06/2022 0943   MONOABS 0.4 06/06/2022 0943   EOSABS 0.2 06/06/2022 0943   BASOSABS 0.0 06/06/2022 0943    Chest Imaging:  July 2023 lung cancer screening CT: AP window adenopathy, lung nodule in the left upper lobe Evidence of emphysema  Nuclear medicine pet imaging: Hypermetabolic cervical lymph node as well as hypermetabolic lesions within the chest. The patient's images have been independently reviewed by me.    HRCT imaging 06/24/2022: Extensive heterogenous and consolidative airspace disease within the perihilar left lung.  Suggestive of radiation pneumonitis bilateral interstitial thickening The patient's images have been independently reviewed by me.    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 Non-small cell carcinoma of left lung, stage 3 (HCC)  C34.92     3. Immunotherapy  Z29.89     4. SOB (shortness of breath)  R06.02     5. Subacute cough  R05.2       Discussion:  77 year old female initially seen for left upper lobe mass enlarged AP window node enlarged cervical node diagnosed with a stage III non-small cell lung cancer.  CT imaging recently completed with concern for radiation pneumonitis.  Plan: Will start 60 mg prednisone daily. Case discussed with medical oncology. They are going to follow-up with them next week and start her tapering and manage that from now on. She can return to see [place] in a couple months.  RTC 3 months or as needed.   Current Outpatient Medications:    benzonatate (TESSALON) 200 MG capsule, Take 1 capsule (200 mg total) by mouth 3 (three) times daily as needed for cough., Disp: 90 capsule, Rfl: 2   predniSONE (  DELTASONE) 20 MG tablet, Take 3 tablets (60  mg total) by mouth daily with breakfast., Disp: 180 tablet, Rfl: 0   apixaban (ELIQUIS) 5 MG TABS tablet, Take 1 tablet (5 mg total) by mouth 2 (two) times daily., Disp: 60 tablet, Rfl: 1   ibuprofen (ADVIL) 200 MG tablet, Take 200 mg by mouth every 8 (eight) hours as needed (pain.). (Patient not taking: Reported on 06/06/2022), Disp: , Rfl:    indapamide (LOZOL) 1.25 MG tablet, Take 1.25 mg by mouth in the morning., Disp: , Rfl:    lidocaine (XYLOCAINE) 2 % solution, Patient: Mix 1part 2% viscous lidocaine, 1part H20. Swallow 72mL of diluted mixture, 33min before meals and at bedtime, up to QID prn soreness (Patient not taking: Reported on 05/17/2022), Disp: 200 mL, Rfl: 3   lidocaine-prilocaine (EMLA) cream, Apply 1 Application topically as needed., Disp: 30 g, Rfl: 2   Polyvinyl Alcohol-Povidone PF (REFRESH) 1.4-0.6 % SOLN, Place 1-2 drops into both eyes 3 (three) times daily as needed (dry/irritated eyes.)., Disp: , Rfl:    prochlorperazine (COMPAZINE) 10 MG tablet, Take 1 tablet (10 mg total) by mouth every 6 (six) hours as needed for nausea or vomiting. (Patient not taking: Reported on 05/17/2022), Disp: 30 tablet, Rfl: 0   rosuvastatin (CRESTOR) 10 MG tablet, Take 10 mg by mouth in the morning., Disp: , Rfl:     Garner Nash, DO [facility] 06/27/2022 2:41 PM

## 2022-06-30 ENCOUNTER — Other Ambulatory Visit: Payer: Self-pay

## 2022-07-03 NOTE — Progress Notes (Unsigned)
[facility] OFFICE PROGRESS NOTE  [name], [address] [place] [address]  DIAGNOSIS: 1) Stage IIIb (T1a, N3, M0) non-small cell lung cancer, adenosquamous carcinoma presented with left upper lobe pulmonary nodule in addition to left hilar, mediastinal and left supraclavicular and cervical lymphadenopathy diagnosed in September 2023. 2) incidental pulmonary embolism of segmental to subsegmental nonocclusive pulmonary embolism in the peripheral right lower lobe diagnosed in November 2023.   Detected Alteration(s) / Biomarker(s)          Associated FDA-approved therapies  Clinical Trial Availability          % cfDNA or Amplification   TP53 R282W None Yes         1.6%   NFE2L2 D29V None Yes        1.2%   TP53 V216M None Yes          0.4%   TMB 18.02 mut/Mb   PDL1 Expression: 80%  PRIOR THERAPY: A course of concurrent chemoradiation with weekly carboplatin for AUC of 2 and paclitaxel 45 Mg/M2. First dose March 11, 2022. Status post 7 cycles of treatment last dose was given in April 15, 2022.   CURRENT THERAPY: 1) Consolidation treatment with immunotherapy with Imfinzi 1500 mg IV every 4 weeks.  First dose May 09, 2022.  Status post 2 cycles. Currently on hold due to suspicious pneumonitis 2) Eliquis   INTERVAL HISTORY: Yolanda Robertson 77 y.o. female returns clinic today for follow-up visit.  The patient last saw Dr. Julien Nordmann on 06/06/2022.  The interval since last being seen, the patient had a follow-up visit with Dr. Valeta Harms on 06/24/2022.  The patient was endorsing 6 weeks of progressive cough and shortness of breath.  Therefore Dr. Valeta Harms arrange for a CT scan which showed suspicious findings for possible pneumonitis which could be radiation related but could also be secondary to drug toxicity from immunotherapy.  She is currently on a high-dose prednisone taper with 60 mg.   The patient was undergoing treatment with Imfinzi IV every 4 weeks.  This  will currently be on hold due to the possible pneumonitis.  Since starting her high-dose prednisone, her breathing is ***.  Cough?  She denies any fever, chills, night sweats, or unexplained weight loss??  Complained of fatigue and cold intolerance last time?  And lack of appetite.  She denies any nausea, vomiting, diarrhea, or constipation.  Denies any chest pain or hemoptysis.  Denies any headaches or visual changes.  Denies any rashes or skin changes.  She denies any abnormal bleeding or bruising and is tolerating her Eliquis well.  She is here today for evaluation and repeat blood work.     MEDICAL HISTORY: Past Medical History:  Diagnosis Date   Hyperlipidemia    Hypertension    Lung mass    left upper lobe    ALLERGIES:  is allergic to no known allergies.  MEDICATIONS:  Current Outpatient Medications  Medication Sig Dispense Refill   apixaban (ELIQUIS) 5 MG TABS tablet Take 1 tablet (5 mg total) by mouth 2 (two) times daily. 60 tablet 1   benzonatate (TESSALON) 200 MG capsule Take 1 capsule (200 mg total) by mouth 3 (three) times daily as needed for cough. 90 capsule 2   ibuprofen (ADVIL) 200 MG tablet Take 200 mg by mouth every 8 (eight) hours as needed (pain.). (Patient not taking: Reported on 06/06/2022)     indapamide (LOZOL) 1.25 MG tablet Take 1.25 mg  by mouth in the morning.     lidocaine (XYLOCAINE) 2 % solution Patient: Mix 1part 2% viscous lidocaine, 1part H20. Swallow 54mL of diluted mixture, 74min before meals and at bedtime, up to QID prn soreness (Patient not taking: Reported on 05/17/2022) 200 mL 3   lidocaine-prilocaine (EMLA) cream Apply 1 Application topically as needed. 30 g 2   Polyvinyl Alcohol-Povidone PF (REFRESH) 1.4-0.6 % SOLN Place 1-2 drops into both eyes 3 (three) times daily as needed (dry/irritated eyes.).     predniSONE (DELTASONE) 20 MG tablet Take 3 tablets (60 mg total) by mouth daily with breakfast. 180 tablet 0   prochlorperazine (COMPAZINE) 10 MG  tablet Take 1 tablet (10 mg total) by mouth every 6 (six) hours as needed for nausea or vomiting. (Patient not taking: Reported on 05/17/2022) 30 tablet 0   rosuvastatin (CRESTOR) 10 MG tablet Take 10 mg by mouth in the morning.     No current facility-administered medications for this visit.    SURGICAL HISTORY:  Past Surgical History:  Procedure Laterality Date   FINE NEEDLE ASPIRATION  02/12/2022   Procedure: FINE NEEDLE ASPIRATION (FNA) LINEAR;  Surgeon: Garner Nash, DO;  Location: [facility];  Service: Pulmonary;;   IR IMAGING GUIDED PORT INSERTION  05/28/2022   VAGINAL HYSTERECTOMY     VIDEO BRONCHOSCOPY WITH ENDOBRONCHIAL ULTRASOUND Left 02/12/2022   Procedure: VIDEO BRONCHOSCOPY WITH ENDOBRONCHIAL ULTRASOUND;  Surgeon: Garner Nash, DO;  Location: [facility];  Service: Pulmonary;  Laterality: Left;    REVIEW OF SYSTEMS:   Review of Systems  Constitutional: Negative for appetite change, chills, fatigue, fever and unexpected weight change.  HENT:   Negative for mouth sores, nosebleeds, sore throat and trouble swallowing.   Eyes: Negative for eye problems and icterus.  Respiratory: Negative for cough, hemoptysis, shortness of breath and wheezing.   Cardiovascular: Negative for chest pain and leg swelling.  Gastrointestinal: Negative for abdominal pain, constipation, diarrhea, nausea and vomiting.  Genitourinary: Negative for bladder incontinence, difficulty urinating, dysuria, frequency and hematuria.   Musculoskeletal: Negative for back pain, gait problem, neck pain and neck stiffness.  Skin: Negative for itching and rash.  Neurological: Negative for dizziness, extremity weakness, gait problem, headaches, light-headedness and seizures.  Hematological: Negative for adenopathy. Does not bruise/bleed easily.  Psychiatric/Behavioral: Negative for confusion, depression and sleep disturbance. The patient is not nervous/anxious.     PHYSICAL EXAMINATION:  There were no  vitals taken for this visit.  ECOG PERFORMANCE STATUS: {CHL ONC ECOG [id number]  Physical Exam  Constitutional: Oriented to person, place, and time and well-developed, well-nourished, and in no distress. No distress.  HENT:  Head: Normocephalic and atraumatic.  Mouth/Throat: Oropharynx is clear and moist. No oropharyngeal exudate.  Eyes: Conjunctivae are normal. Right eye exhibits no discharge. Left eye exhibits no discharge. No scleral icterus.  Neck: Normal range of motion. Neck supple.  Cardiovascular: Normal rate, regular rhythm, normal heart sounds and intact distal pulses.   Pulmonary/Chest: Effort normal and breath sounds normal. No respiratory distress. No wheezes. No rales.  Abdominal: Soft. Bowel sounds are normal. Exhibits no distension and no mass. There is no tenderness.  Musculoskeletal: Normal range of motion. Exhibits no edema.  Lymphadenopathy:    No cervical adenopathy.  Neurological: Alert and oriented to person, place, and time. Exhibits normal muscle tone. Gait normal. Coordination normal.  Skin: Skin is warm and dry. No rash noted. Not diaphoretic. No erythema. No pallor.  Psychiatric: Mood, memory and judgment normal.  Vitals reviewed.  LABORATORY DATA: Lab Results  Component Value Date   WBC 5.5 06/06/2022   HGB 9.4 (L) 06/06/2022   HCT 28.0 (L) 06/06/2022   MCV 87.8 06/06/2022   PLT 324 06/06/2022      Chemistry      Component Value Date/Time   NA 137 06/06/2022 0943   K 4.4 06/06/2022 0943   CL 103 06/06/2022 0943   CO2 27 06/06/2022 0943   BUN 23 06/06/2022 0943   CREATININE 1.26 (H) 06/06/2022 0943      Component Value Date/Time   CALCIUM 9.6 06/06/2022 0943   ALKPHOS 55 06/06/2022 0943   AST 10 (L) 06/06/2022 0943   ALT 8 06/06/2022 0943   BILITOT 0.4 06/06/2022 0943       RADIOGRAPHIC STUDIES:  CT CHEST HIGH RESOLUTION  Result Date: 06/25/2022 CLINICAL DATA:  Dyspnea on exertion, cough, possible drug-induced interstitial lung  disease history of lung cancer, status post XRT and chemotherapy, now on immunotherapy * Tracking Code: BO * EXAM: CT CHEST WITHOUT CONTRAST TECHNIQUE: Multidetector CT imaging of the chest was performed following the standard protocol without intravenous contrast. High resolution imaging of the lungs, as well as inspiratory and expiratory imaging, was performed. RADIATION DOSE REDUCTION: This exam was performed according to the departmental dose-optimization program which includes automated exposure control, adjustment of the mA and/or kV according to patient size and/or use of iterative reconstruction technique. COMPARISON:  04/19/2022 FINDINGS: Cardiovascular: Right chest port catheter. Aortic atherosclerosis. Normal heart size. Right coronary artery calcifications. No pericardial effusion. Mediastinum/Nodes: Interval decrease in size of an enlarged AP window lymph node, measuring 1.7 x 1.2 cm, previously 2.3 x 1.7 cm (series 2, image 52). Similar appearance of additional prominent lymph nodes throughout the mediastinum and hila. Thyroid gland, trachea, and esophagus demonstrate no significant findings. Lungs/Pleura: New, very extensive heterogeneous and consolidative airspace opacity throughout the perihilar left lung, with substantial associated bronchiectasis (series 4, image 104). Irregular interstitial opacity and septal thickening throughout the lungs, particularly in the bilateral lung bases although also seen in the right upper lobe, new compared to prior examination (series 4, image 134, 214). No significant air trapping on expiratory phase imaging. Upper Abdomen: No acute abnormality. Unchanged benign macroscopic fat containing right adrenal adenoma, not previously FDG avid, for which no further follow-up or characterization is required (series 2, image 138). Musculoskeletal: No chest wall abnormality. No acute osseous findings. Disc degenerative disease and bridging osteophytosis throughout the  thoracic spine in keeping with DISH. IMPRESSION: 1. New, very extensive heterogeneous and consolidative airspace opacity throughout the perihilar left lung, with substantial associated bronchiectasis. 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 2. Irregular interstitial opacity and septal thickening throughout the lungs, particularly in the bilateral lung bases although also seen in the right upper lobe, new compared to prior examination. This may reflect some component of drug-induced interstitial lung disease if clinically suspected, however the development of overt fibrosis in the short interval to prior examination dated 04/19/2022 would be unlikely. 3. Interval decrease in size of an enlarged and previously FDG avid AP window lymph node, consistent with continued treatment response. Similar appearance of additional prominent lymph nodes throughout the mediastinum and hila. 4. Coronary artery disease. Aortic Atherosclerosis (ICD10-I70.0). Electronically Signed   By: Delanna Ahmadi M.D.   On: 06/25/2022 11:35     ASSESSMENT/PLAN:  This is a very pleasant 77 year old African-American female with stage IIIb (T1a, N3, M0) non-small cell lung cancer, adenosquamous carcinoma.  The patient presented with a left upper lobe pulmonary nodule in addition to left hilar, mediastinal, left supraclavicular and cervical lymphadenopathy.  She was diagnosed in September 2023.   The patient does not have any actual mutations.  Her PD-L1 expression is 80%.   The patient underwent a course of concurrent chemoradiation with weekly carboplatin for an AUC 2 and paclitaxel 45 mg/m.  First dose was on 03/11/2022.  She is status post 7 cycles and the last dose was given on 04/15/2022.  She was undergoing consolidation immunotherapy with Imfinzi 1500 mg IV every 4 weeks.  She is status post 2 cycles.  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  The patient was seen with Dr. Julien Nordmann today.  The patient is currently on a high-dose prednisone taper.  She is currently taking 60 mg p.o. daily.  We will work on tapering her off.  Starting tomorrow she will take 40 mg p.o. daily for 1 week followed by 20 mg p.o. daily for 1 week followed by 10 mg p.o. daily for 1 week then she will stop.  We will continue to hold her immunotherapy.  We will see her back for labs and follow-up visit in ***  Her breathing is?  Appetite?  Follow-up  Anemia?  She will continue taking Eliquis due to her history of pulmonary embolism.  The patient was advised to call immediately if she has any concerning symptoms in the interval. The patient voices understanding of current disease status and treatment options and is in agreement with the current care plan. All questions were answered. The patient knows to call the clinic with any problems, questions or concerns. We can certainly see the patient much sooner if necessary   No orders of the defined types were placed in this encounter.    I spent {CHL ONC TIME VISIT - CZYSA:[id number]} counseling the patient face to face. The total time spent in the appointment was {CHL ONC TIME VISIT - ATFTD:[id number]}.  Wirt Hemmerich L Evadna Donaghy, PA-C 07/03/22

## 2022-07-04 ENCOUNTER — Inpatient Hospital Stay: Payer: Medicare Other

## 2022-07-04 ENCOUNTER — Other Ambulatory Visit: Payer: Medicare Other

## 2022-07-04 ENCOUNTER — Other Ambulatory Visit: Payer: Self-pay

## 2022-07-04 ENCOUNTER — Encounter: Payer: Self-pay | Admitting: Internal Medicine

## 2022-07-04 ENCOUNTER — Inpatient Hospital Stay: Payer: Medicare Other | Attending: Internal Medicine | Admitting: Physician Assistant

## 2022-07-04 DIAGNOSIS — C778 Secondary and unspecified malignant neoplasm of lymph nodes of multiple regions: Secondary | ICD-10-CM | POA: Insufficient documentation

## 2022-07-04 DIAGNOSIS — R0609 Other forms of dyspnea: Secondary | ICD-10-CM | POA: Diagnosis not present

## 2022-07-04 DIAGNOSIS — Z86711 Personal history of pulmonary embolism: Secondary | ICD-10-CM | POA: Insufficient documentation

## 2022-07-04 DIAGNOSIS — Z79899 Other long term (current) drug therapy: Secondary | ICD-10-CM | POA: Diagnosis not present

## 2022-07-04 DIAGNOSIS — Z7901 Long term (current) use of anticoagulants: Secondary | ICD-10-CM | POA: Insufficient documentation

## 2022-07-04 DIAGNOSIS — Z86018 Personal history of other benign neoplasm: Secondary | ICD-10-CM | POA: Diagnosis not present

## 2022-07-04 DIAGNOSIS — Z7952 Long term (current) use of systemic steroids: Secondary | ICD-10-CM | POA: Insufficient documentation

## 2022-07-04 DIAGNOSIS — D649 Anemia, unspecified: Secondary | ICD-10-CM | POA: Diagnosis not present

## 2022-07-04 DIAGNOSIS — J479 Bronchiectasis, uncomplicated: Secondary | ICD-10-CM | POA: Diagnosis not present

## 2022-07-04 DIAGNOSIS — C3492 Malignant neoplasm of unspecified part of left bronchus or lung: Secondary | ICD-10-CM | POA: Diagnosis not present

## 2022-07-04 DIAGNOSIS — I2510 Atherosclerotic heart disease of native coronary artery without angina pectoris: Secondary | ICD-10-CM | POA: Diagnosis not present

## 2022-07-04 DIAGNOSIS — I700 Atherosclerosis of aorta: Secondary | ICD-10-CM | POA: Diagnosis not present

## 2022-07-04 DIAGNOSIS — C3412 Malignant neoplasm of upper lobe, left bronchus or lung: Secondary | ICD-10-CM | POA: Diagnosis not present

## 2022-07-04 DIAGNOSIS — Z90710 Acquired absence of both cervix and uterus: Secondary | ICD-10-CM | POA: Insufficient documentation

## 2022-07-04 DIAGNOSIS — Z95828 Presence of other vascular implants and grafts: Secondary | ICD-10-CM | POA: Insufficient documentation

## 2022-07-04 LAB — CBC WITH DIFFERENTIAL (CANCER CENTER ONLY)
Abs Immature Granulocytes: 0.08 10*3/uL — ABNORMAL HIGH (ref 0.00–0.07)
Basophils Absolute: 0 10*3/uL (ref 0.0–0.1)
Basophils Relative: 0 %
Eosinophils Absolute: 0 10*3/uL (ref 0.0–0.5)
Eosinophils Relative: 0 %
HCT: 29.4 % — ABNORMAL LOW (ref 36.0–46.0)
Hemoglobin: 9.7 g/dL — ABNORMAL LOW (ref 12.0–15.0)
Immature Granulocytes: 1 %
Lymphocytes Relative: 16 %
Lymphs Abs: 1.2 10*3/uL (ref 0.7–4.0)
MCH: 29.1 pg (ref 26.0–34.0)
MCHC: 33 g/dL (ref 30.0–36.0)
MCV: 88.3 fL (ref 80.0–100.0)
Monocytes Absolute: 0.7 10*3/uL (ref 0.1–1.0)
Monocytes Relative: 9 %
Neutro Abs: 5.9 10*3/uL (ref 1.7–7.7)
Neutrophils Relative %: 74 %
Platelet Count: 264 10*3/uL (ref 150–400)
RBC: 3.33 MIL/uL — ABNORMAL LOW (ref 3.87–5.11)
RDW: 15.3 % (ref 11.5–15.5)
WBC Count: 7.9 10*3/uL (ref 4.0–10.5)
nRBC: 0 % (ref 0.0–0.2)

## 2022-07-04 LAB — CMP (CANCER CENTER ONLY)
ALT: 24 U/L (ref 0–44)
AST: 14 U/L — ABNORMAL LOW (ref 15–41)
Albumin: 3.5 g/dL (ref 3.5–5.0)
Alkaline Phosphatase: 57 U/L (ref 38–126)
Anion gap: 6 (ref 5–15)
BUN: 35 mg/dL — ABNORMAL HIGH (ref 8–23)
CO2: 26 mmol/L (ref 22–32)
Calcium: 9.2 mg/dL (ref 8.9–10.3)
Chloride: 104 mmol/L (ref 98–111)
Creatinine: 1.23 mg/dL — ABNORMAL HIGH (ref 0.44–1.00)
GFR, Estimated: 46 mL/min — ABNORMAL LOW (ref >60)
Glucose, Bld: 73 mg/dL (ref 70–99)
Potassium: 4.4 mmol/L (ref 3.5–5.1)
Sodium: 136 mmol/L (ref 135–145)
Total Bilirubin: 0.3 mg/dL (ref 0.3–1.2)
Total Protein: 6.7 g/dL (ref 6.5–8.1)

## 2022-07-04 LAB — TSH: TSH: 0.948 u[IU]/mL (ref 0.350–4.500)

## 2022-07-04 MED ORDER — SODIUM CHLORIDE 0.9% FLUSH
10.0000 mL | Freq: Once | INTRAVENOUS | Status: AC
  Administered 2022-07-04: 10 mL

## 2022-07-04 MED ORDER — SODIUM CHLORIDE 0.9% FLUSH
10.0000 mL | INTRAVENOUS | Status: DC | PRN
  Administered 2022-07-04: 10 mL via INTRAVENOUS

## 2022-07-04 MED ORDER — HEPARIN SOD (PORK) LOCK FLUSH 100 UNIT/ML IV SOLN
500.0000 [IU] | Freq: Once | INTRAVENOUS | Status: AC
  Administered 2022-07-04: 500 [IU] via INTRAVENOUS

## 2022-07-04 NOTE — Progress Notes (Signed)
This nurse verified that patient does not have an allergy to Heparin. Port flushed and locked with Heparin.  Patient tolerated well.  No discomfort noted.  Stable and ambulatory at discharge.

## 2022-07-05 LAB — T4: T4, Total: 6.2 ug/dL (ref 4.5–12.0)

## 2022-07-16 ENCOUNTER — Other Ambulatory Visit: Payer: Self-pay

## 2022-07-16 DIAGNOSIS — I2699 Other pulmonary embolism without acute cor pulmonale: Secondary | ICD-10-CM

## 2022-07-16 MED ORDER — APIXABAN 5 MG PO TABS: 5.0000 mg | ORAL_TABLET | Freq: Two times a day (BID) | ORAL | 1 refills | Status: DC

## 2022-07-17 ENCOUNTER — Ambulatory Visit
Admission: RE | Admit: 2022-07-17 | Discharge: 2022-07-17 | Disposition: A | Discharge status: Home / Self Care | Payer: Medicare Other | Source: Ambulatory Visit | Attending: Family Medicine | Admitting: Family Medicine

## 2022-07-17 ENCOUNTER — Encounter: Payer: Self-pay | Admitting: Internal Medicine

## 2022-07-17 DIAGNOSIS — Z1231 Encounter for screening mammogram for malignant neoplasm of breast: Secondary | ICD-10-CM

## 2022-07-23 DIAGNOSIS — E1169 Type 2 diabetes mellitus with other specified complication: Secondary | ICD-10-CM | POA: Diagnosis not present

## 2022-07-23 DIAGNOSIS — N189 Chronic kidney disease, unspecified: Secondary | ICD-10-CM | POA: Diagnosis not present

## 2022-07-23 DIAGNOSIS — I10 Essential (primary) hypertension: Secondary | ICD-10-CM | POA: Diagnosis not present

## 2022-08-01 ENCOUNTER — Ambulatory Visit: Payer: Medicare Other | Admitting: Internal Medicine

## 2022-08-01 ENCOUNTER — Other Ambulatory Visit: Payer: Medicare Other

## 2022-08-01 ENCOUNTER — Ambulatory Visit: Payer: Medicare Other

## 2022-08-14 ENCOUNTER — Other Ambulatory Visit: Payer: Self-pay | Admitting: Medical Oncology

## 2022-08-14 NOTE — Progress Notes (Signed)
Labs ordered.

## 2022-08-15 ENCOUNTER — Other Ambulatory Visit: Payer: Self-pay

## 2022-08-15 ENCOUNTER — Inpatient Hospital Stay: Payer: Medicare Other | Attending: Internal Medicine

## 2022-08-15 ENCOUNTER — Ambulatory Visit (HOSPITAL_COMMUNITY)
Admission: RE | Admit: 2022-08-15 | Discharge: 2022-08-15 | Disposition: A | Discharge status: Home / Self Care | Payer: Medicare Other | Source: Ambulatory Visit | Attending: Physician Assistant | Admitting: Physician Assistant

## 2022-08-15 DIAGNOSIS — I700 Atherosclerosis of aorta: Secondary | ICD-10-CM | POA: Diagnosis not present

## 2022-08-15 DIAGNOSIS — C3492 Malignant neoplasm of unspecified part of left bronchus or lung: Secondary | ICD-10-CM | POA: Insufficient documentation

## 2022-08-15 DIAGNOSIS — Z95828 Presence of other vascular implants and grafts: Secondary | ICD-10-CM

## 2022-08-15 DIAGNOSIS — R590 Localized enlarged lymph nodes: Secondary | ICD-10-CM | POA: Insufficient documentation

## 2022-08-15 DIAGNOSIS — Z79899 Other long term (current) drug therapy: Secondary | ICD-10-CM | POA: Insufficient documentation

## 2022-08-15 DIAGNOSIS — C3412 Malignant neoplasm of upper lobe, left bronchus or lung: Secondary | ICD-10-CM | POA: Insufficient documentation

## 2022-08-15 DIAGNOSIS — R918 Other nonspecific abnormal finding of lung field: Secondary | ICD-10-CM | POA: Diagnosis not present

## 2022-08-15 DIAGNOSIS — C3490 Malignant neoplasm of unspecified part of unspecified bronchus or lung: Secondary | ICD-10-CM | POA: Diagnosis not present

## 2022-08-15 LAB — CBC WITH DIFFERENTIAL (CANCER CENTER ONLY)
Abs Immature Granulocytes: 0.04 10*3/uL (ref 0.00–0.07)
Basophils Absolute: 0 10*3/uL (ref 0.0–0.1)
Basophils Relative: 1 %
Eosinophils Absolute: 0.2 10*3/uL (ref 0.0–0.5)
Eosinophils Relative: 5 %
HCT: 33 % — ABNORMAL LOW (ref 36.0–46.0)
Hemoglobin: 10.8 g/dL — ABNORMAL LOW (ref 12.0–15.0)
Immature Granulocytes: 1 %
Lymphocytes Relative: 23 %
Lymphs Abs: 1.1 10*3/uL (ref 0.7–4.0)
MCH: 28.3 pg (ref 26.0–34.0)
MCHC: 32.7 g/dL (ref 30.0–36.0)
MCV: 86.6 fL (ref 80.0–100.0)
Monocytes Absolute: 0.6 10*3/uL (ref 0.1–1.0)
Monocytes Relative: 13 %
Neutro Abs: 2.7 10*3/uL (ref 1.7–7.7)
Neutrophils Relative %: 57 %
Platelet Count: 235 10*3/uL (ref 150–400)
RBC: 3.81 MIL/uL — ABNORMAL LOW (ref 3.87–5.11)
RDW: 14.6 % (ref 11.5–15.5)
WBC Count: 4.6 10*3/uL (ref 4.0–10.5)
nRBC: 0 % (ref 0.0–0.2)

## 2022-08-15 LAB — CMP (CANCER CENTER ONLY)
ALT: 10 U/L (ref 0–44)
AST: 13 U/L — ABNORMAL LOW (ref 15–41)
Albumin: 4 g/dL (ref 3.5–5.0)
Alkaline Phosphatase: 64 U/L (ref 38–126)
Anion gap: 7 (ref 5–15)
BUN: 21 mg/dL (ref 8–23)
CO2: 26 mmol/L (ref 22–32)
Calcium: 9.5 mg/dL (ref 8.9–10.3)
Chloride: 106 mmol/L (ref 98–111)
Creatinine: 1.39 mg/dL — ABNORMAL HIGH (ref 0.44–1.00)
GFR, Estimated: 39 mL/min — ABNORMAL LOW (ref >60)
Glucose, Bld: 93 mg/dL (ref 70–99)
Potassium: 4.5 mmol/L (ref 3.5–5.1)
Sodium: 139 mmol/L (ref 135–145)
Total Bilirubin: 0.3 mg/dL (ref 0.3–1.2)
Total Protein: 7.3 g/dL (ref 6.5–8.1)

## 2022-08-15 MED ORDER — HEPARIN SOD (PORK) LOCK FLUSH 100 UNIT/ML IV SOLN
500.0000 [IU] | Freq: Once | INTRAVENOUS | Status: AC
  Administered 2022-08-15: 500 [IU] via INTRAVENOUS

## 2022-08-15 MED ORDER — IOHEXOL 300 MG/ML  SOLN
75.0000 mL | Freq: Once | INTRAMUSCULAR | Status: AC | PRN
  Administered 2022-08-15: 75 mL via INTRAVENOUS

## 2022-08-15 MED ORDER — SODIUM CHLORIDE (PF) 0.9 % IJ SOLN
INTRAMUSCULAR | Status: AC
  Filled 2022-08-15: qty 50

## 2022-08-15 MED ORDER — SODIUM CHLORIDE 0.9% FLUSH
10.0000 mL | Freq: Once | INTRAVENOUS | Status: AC
  Administered 2022-08-15: 10 mL

## 2022-08-19 ENCOUNTER — Telehealth: Payer: Self-pay

## 2022-08-19 ENCOUNTER — Other Ambulatory Visit: Payer: Self-pay

## 2022-08-19 DIAGNOSIS — I2699 Other pulmonary embolism without acute cor pulmonale: Secondary | ICD-10-CM

## 2022-08-19 MED ORDER — APIXABAN 5 MG PO TABS: 5.0000 mg | ORAL_TABLET | Freq: Two times a day (BID) | ORAL | 1 refills | Status: DC

## 2022-08-19 NOTE — Telephone Encounter (Signed)
Patient called in stating she has had a cough, dizziness, shortness of breath, and headache. Patient stated that she is taking Allegra OTC but nothing else for her symptoms. Patient has no fever, she tested negative for Covid. Spoke with nurse Diane she stated to use otc products to treat the symptoms. If she develops a fever to call us and follow up at her 4-10 appointment with Dr. Julien Nordmann.

## 2022-08-23 ENCOUNTER — Other Ambulatory Visit: Payer: Self-pay | Admitting: Pulmonary Disease

## 2022-08-26 NOTE — Telephone Encounter (Signed)
Follow-up with Dr. Mohamed 

## 2022-09-04 ENCOUNTER — Other Ambulatory Visit: Payer: Self-pay

## 2022-09-04 ENCOUNTER — Other Ambulatory Visit: Payer: Self-pay | Admitting: Medical Oncology

## 2022-09-04 ENCOUNTER — Inpatient Hospital Stay: Payer: Medicare Other | Attending: Internal Medicine | Admitting: Internal Medicine

## 2022-09-04 VITALS — BP 115/73 | HR 90 | Temp 98.2°F | Resp 15 | Wt 172.5 lb

## 2022-09-04 DIAGNOSIS — Y842 Radiological procedure and radiotherapy as the cause of abnormal reaction of the patient, or of later complication, without mention of misadventure at the time of the procedure: Secondary | ICD-10-CM | POA: Diagnosis not present

## 2022-09-04 DIAGNOSIS — R059 Cough, unspecified: Secondary | ICD-10-CM | POA: Insufficient documentation

## 2022-09-04 DIAGNOSIS — D649 Anemia, unspecified: Secondary | ICD-10-CM | POA: Diagnosis not present

## 2022-09-04 DIAGNOSIS — C3490 Malignant neoplasm of unspecified part of unspecified bronchus or lung: Secondary | ICD-10-CM | POA: Diagnosis not present

## 2022-09-04 DIAGNOSIS — Z90710 Acquired absence of both cervix and uterus: Secondary | ICD-10-CM | POA: Diagnosis not present

## 2022-09-04 DIAGNOSIS — Z7901 Long term (current) use of anticoagulants: Secondary | ICD-10-CM | POA: Insufficient documentation

## 2022-09-04 DIAGNOSIS — I10 Essential (primary) hypertension: Secondary | ICD-10-CM | POA: Insufficient documentation

## 2022-09-04 DIAGNOSIS — I700 Atherosclerosis of aorta: Secondary | ICD-10-CM | POA: Diagnosis not present

## 2022-09-04 DIAGNOSIS — C3412 Malignant neoplasm of upper lobe, left bronchus or lung: Secondary | ICD-10-CM | POA: Insufficient documentation

## 2022-09-04 DIAGNOSIS — R0602 Shortness of breath: Secondary | ICD-10-CM | POA: Insufficient documentation

## 2022-09-04 DIAGNOSIS — R5383 Other fatigue: Secondary | ICD-10-CM | POA: Diagnosis not present

## 2022-09-04 DIAGNOSIS — J700 Acute pulmonary manifestations due to radiation: Secondary | ICD-10-CM | POA: Diagnosis not present

## 2022-09-04 DIAGNOSIS — R0609 Other forms of dyspnea: Secondary | ICD-10-CM | POA: Diagnosis not present

## 2022-09-04 DIAGNOSIS — Z86711 Personal history of pulmonary embolism: Secondary | ICD-10-CM | POA: Insufficient documentation

## 2022-09-04 DIAGNOSIS — E785 Hyperlipidemia, unspecified: Secondary | ICD-10-CM | POA: Insufficient documentation

## 2022-09-04 NOTE — Progress Notes (Signed)
[facility] [phone]   [phone]  OFFICE PROGRESS NOTE  [name], MD [address] [place] [address]  DIAGNOSIS:  1) Stage IIIb (T1a, N3, M0) non-small cell lung cancer, adenosquamous carcinoma presented with left upper lobe pulmonary nodule in addition to left hilar, mediastinal and left supraclavicular and cervical lymphadenopathy diagnosed in September 2023. 2) incidental pulmonary embolism of segmental to subsegmental nonocclusive pulmonary embolism in the peripheral right lower lobe diagnosed in November 2023.  Detected Alteration(s) / Biomarker(s) Associated FDA-approved therapies Clinical Trial Availability % cfDNA or Amplification  TP53 [id number] None Yes 1.6%  NFE2L2 D29V None Yes 1.2%  TP53 V216M None Yes 0.4%  TMB 18.02 mut/Mb  PDL1 Expression: 80%  PRIOR THERAPY:  1) A course of concurrent chemoradiation with weekly carboplatin for AUC of 2 and paclitaxel 45 Mg/M2.  First dose March 11, 2022.  Status post 7 cycles of treatment last dose was given in April 15, 2022. 2) Consolidation treatment with immunotherapy with Imfinzi 1500 mg IV every 4 weeks.  First dose May 09, 2022.  Status post 2 cycles.  Treatment with continued secondary to suspicious immunotherapy mediated pneumonitis.  CURRENT THERAPY:  1) Eliquis 5 mg p.o. twice daily.  INTERVAL HISTORY: Yolanda Robertson 77 y.o. female returns to the clinic today for follow-up visit accompanied by her son.  The patient is feeling fine today with no concerning complaints except for shortness of breath with exertion.  She denied having any chest pain but has mild cough productive of whitish sputum with no hemoptysis.  She has no nausea, vomiting, diarrhea or constipation.  She has no headache or visual changes.  She denied having any fever or chills.  She has no weight loss or night sweats.  She is here today for evaluation with repeat CT scan of the chest for  restaging of her disease. [name]  MEDICAL HISTORY: Past Medical History:  Diagnosis Date   Hyperlipidemia    Hypertension    Lung mass    left upper lobe    ALLERGIES:  is allergic to no known allergies.  MEDICATIONS:  Current Outpatient Medications  Medication Sig Dispense Refill   apixaban (ELIQUIS) 5 MG TABS tablet Take 1 tablet (5 mg total) by mouth 2 (two) times daily. 60 tablet 1   benzonatate (TESSALON) 200 MG capsule Take 1 capsule (200 mg total) by mouth 3 (three) times daily as needed for cough. 90 capsule 2   ibuprofen (ADVIL) 200 MG tablet Take 200 mg by mouth every 8 (eight) hours as needed (pain.). (Patient not taking: Reported on 06/06/2022)     indapamide (LOZOL) 1.25 MG tablet Take 1.25 mg by mouth in the morning.     lidocaine (XYLOCAINE) 2 % solution Patient: Mix 1part 2% viscous lidocaine, 1part H20. Swallow 24mL of diluted mixture, before meals and at bedtime, up to QID prn soreness (Patient not taking: Reported on 05/17/2022) 200 mL 3   lidocaine-prilocaine (EMLA) cream Apply 1 Application topically as needed. 30 g 2   Polyvinyl Alcohol-Povidone PF (REFRESH) 1.4-0.6 % SOLN Place 1-2 drops into both eyes 3 (three) times daily as needed (dry/irritated eyes.).     prochlorperazine (COMPAZINE) 10 MG tablet Take 1 tablet (10 mg total) by mouth every 6 (six) hours as needed for nausea or vomiting. (Patient not taking: Reported on 05/17/2022) 30 tablet 0   rosuvastatin (CRESTOR) 10 MG tablet Take 10 mg by mouth in the morning.  No current facility-administered medications for this visit.    SURGICAL HISTORY:  Past Surgical History:  Procedure Laterality Date   FINE NEEDLE ASPIRATION  02/12/2022   Procedure: FINE NEEDLE ASPIRATION (FNA) LINEAR;  Surgeon: Josephine IgoIcard, Bradley L, DO;  Location: [facility];  Service: Pulmonary;;   IR IMAGING GUIDED PORT INSERTION  05/28/2022   VAGINAL HYSTERECTOMY     VIDEO BRONCHOSCOPY WITH ENDOBRONCHIAL ULTRASOUND Left 02/12/2022    Procedure: VIDEO BRONCHOSCOPY WITH ENDOBRONCHIAL ULTRASOUND;  Surgeon: Josephine IgoIcard, Bradley L, DO;  Location: [facility];  Service: Pulmonary;  Laterality: Left;    REVIEW OF SYSTEMS:  A comprehensive review of systems was negative except for: Constitutional: positive for fatigue Respiratory: positive for cough and dyspnea on exertion   PHYSICAL EXAMINATION: General appearance: alert, cooperative, fatigued, and no distress Head: Normocephalic, without obvious abnormality, atraumatic Neck: no adenopathy, no JVD, supple, symmetrical, trachea midline, and thyroid not enlarged, symmetric, no tenderness/mass/nodules Lymph nodes: Cervical, supraclavicular, and axillary nodes normal. Resp: clear to auscultation bilaterally Back: symmetric, no curvature. ROM normal. No CVA tenderness. Cardio: regular rate and rhythm, S1, S2 normal, no murmur, click, rub or gallop GI: soft, non-tender; bowel sounds normal; no masses,  no organomegaly Extremities: extremities normal, atraumatic, no cyanosis or edema  ECOG PERFORMANCE STATUS: 1 - Symptomatic but completely ambulatory  Blood pressure 115/73, pulse 90, temperature 98.2 F (36.8 C), temperature source Oral, resp. rate 15, weight 172 lb 8 oz (78.2 kg), SpO2 94 %.  LABORATORY DATA: Lab Results  Component Value Date   WBC 4.6 08/15/2022   HGB 10.8 (L) 08/15/2022   HCT 33.0 (L) 08/15/2022   MCV 86.6 08/15/2022   PLT 235 08/15/2022      Chemistry      Component Value Date/Time   NA 139 08/15/2022 1004   K 4.5 08/15/2022 1004   CL 106 08/15/2022 1004   CO2 26 08/15/2022 1004   BUN 21 08/15/2022 1004   CREATININE 1.39 (H) 08/15/2022 1004      Component Value Date/Time   CALCIUM 9.5 08/15/2022 1004   ALKPHOS 64 08/15/2022 1004   AST 13 (L) 08/15/2022 1004   ALT 10 08/15/2022 1004   BILITOT 0.3 08/15/2022 1004       RADIOGRAPHIC STUDIES: CT Chest W Contrast  Result Date: 08/17/2022 CLINICAL DATA:  Non-small-cell lung cancer. Restaging. *  Tracking Code: BO * EXAM: CT CHEST WITH CONTRAST TECHNIQUE: Multidetector CT imaging of the chest was performed during intravenous contrast administration. RADIATION DOSE REDUCTION: This exam was performed according to the departmental dose-optimization program which includes automated exposure control, adjustment of the mA and/or kV according to patient size and/or use of iterative reconstruction technique. CONTRAST:  75mL OMNIPAQUE IOHEXOL 300 MG/ML  SOLN COMPARISON:  04/19/2022.  06/24/2022 FINDINGS: Cardiovascular: The heart size is normal. No substantial pericardial effusion. Coronary artery calcification is evident. Mild atherosclerotic calcification is noted in the wall of the thoracic aorta. Right Port-A-Cath tip is positioned in the right atrium. Mediastinum/Nodes: Scattered small mediastinal lymph nodes again noted without mediastinal lymphadenopathy by CT size criteria. There is no hilar lymphadenopathy. The esophagus has normal imaging features. There is no axillary lymphadenopathy. Lungs/Pleura: Consolidative disease in the left upper lobe and superior segment of the left lower lobe is similar to 06/24/2022. 12 mm nodular opacity in the peripheral right upper lobe (36/5) is new in the interval. No dense focal airspace consolidation. No substantial pleural effusion. Upper Abdomen: Stable appearance of right adrenal nodule characterized previously as benign adenoma. Visualized upper  abdomen otherwise unremarkable. Musculoskeletal: No worrisome lytic or sclerotic osseous abnormality. IMPRESSION: 1. Consolidative disease in the left upper lobe and superior segment of the left lower lobe is similar to 06/24/2022. Imaging features may reflect scarring from radiation therapy. 2. 12 mm nodular opacity in the peripheral right upper lobe is new in the interval. 3. Stable scattered mediastinal lymph nodes, un enlarged by CT size criteria. 4. Stable right adrenal nodule characterized previously as benign adenoma.  5.  Aortic Atherosclerosis (ICD10-I70.0). Electronically Signed   By: Kennith Center M.D.   On: 08/17/2022 08:00    ASSESSMENT AND PLAN: This is a very pleasant 77 years old African-American female with Stage IIIb (T1a, N3, M0) non-small cell lung cancer, adenosquamous carcinoma presented with left upper lobe pulmonary nodule in addition to left hilar, mediastinal and left supraclavicular and cervical lymphadenopathy diagnosed in September 2023. Molecular studies showed no actionable mutations and PD-L1 expression is 80%. The patient also had bronchoscopy by Dr. Tonia Brooms and the final pathology was consistent with adenosquamous carcinoma from the left upper lobe lung nodule. 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 The patient underwent a course of concurrent chemoradiation with weekly carboplatin for AUC of 2 and paclitaxel 45 Mg/M2.  First dose March 11, 2022.  Status post 7 cycles the last dose was given April 15, 2022. The patient underwent consolidation treatment with immunotherapy with Imfinzi 1500 Mg IV every 4 weeks status post 2 cycles.  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 She had repeat CT scan of the chest performed recently.  I personally and independently reviewed the scan images and discussed the result with the patient and her son today. Her scan showed no concerning findings for disease progression except for the development of 1.2 cm nodular opacity in the peripheral right upper lobe that is new in the interval. This could be inflammatory but neoplastic lesion could not be completely excluded. I recommended for the patient continue on observation with repeat CT scan of the chest in 2 months for further evaluation of this nodule. For the anemia, the patient will continue on oral ferrous sulfate with vitamin C. For the history of pulmonary  embolism, she is currently on Eliquis 5 mg p.o. twice daily. The patient was advised to call immediately if she has any other concerning symptoms in the interval. The patient voices understanding of current disease status and treatment options and is in agreement with the current care plan.  All questions were answered. The patient knows to call the clinic with any problems, questions or concerns. We can certainly see the patient much sooner if necessary.  The total time spent in the appointment was 30 minutes.  Disclaimer: This note was dictated with voice recognition software. Similar sounding words can inadvertently be transcribed and may not be corrected upon review.

## 2022-09-11 ENCOUNTER — Encounter: Payer: Self-pay | Admitting: Pulmonary Disease

## 2022-09-11 ENCOUNTER — Ambulatory Visit: Payer: Medicare Other | Admitting: Pulmonary Disease

## 2022-09-11 VITALS — BP 100/70 | HR 67 | Ht 64.0 in | Wt 171.2 lb

## 2022-09-11 DIAGNOSIS — Z87891 Personal history of nicotine dependence: Secondary | ICD-10-CM

## 2022-09-11 DIAGNOSIS — J700 Acute pulmonary manifestations due to radiation: Secondary | ICD-10-CM | POA: Diagnosis not present

## 2022-09-11 DIAGNOSIS — J479 Bronchiectasis, uncomplicated: Secondary | ICD-10-CM | POA: Diagnosis not present

## 2022-09-11 DIAGNOSIS — C3492 Malignant neoplasm of unspecified part of left bronchus or lung: Secondary | ICD-10-CM

## 2022-09-11 DIAGNOSIS — R052 Subacute cough: Secondary | ICD-10-CM

## 2022-09-11 DIAGNOSIS — R911 Solitary pulmonary nodule: Secondary | ICD-10-CM

## 2022-09-11 MED ORDER — ALBUTEROL SULFATE (2.5 MG/3ML) 0.083% IN NEBU: 2.5000 mg | INHALATION_SOLUTION | Freq: Four times a day (QID) | RESPIRATORY_TRACT | 12 refills | Status: AC | PRN

## 2022-09-11 MED ORDER — TRELEGY ELLIPTA 100-62.5-25 MCG/ACT IN AEPB: 1.0000 | INHALATION_SPRAY | Freq: Once | RESPIRATORY_TRACT | 6 refills | Status: AC

## 2022-09-11 NOTE — Progress Notes (Signed)
Synopsis: Referred in August 2023 for lung nodule by [name], MD  Subjective:   PATIENT ID: Yolanda Robertson: female DOB: 1945-08-08, MRN: [id number]  Chief Complaint  Patient presents with   Follow-up    Cough, SOB    This is a 77 year old female past medical history of hypertension, hyperlipidemia.Patient had a lung cancer screening CT completed on 12/17/2021 which was read as a lung RADS 4X.  Patient was found to have a irregular nodule within the left upper lobe at 11.5 mm.  Was also found to have a AP window lymph node measuring 2.1 cm.  Patient also complaining today of enlarged lymph node in her left neck.  OV 02/06/2022: Here today for follow-up.  Patient had a recent PET CT which revealed hypermetabolic nodes in the cervical chain as well as hypermetabolic lesions within the chest.  We reviewed her PET scan imaging today.  OV 06/24/2022: Here today for follow-up.  She is currently undergoing treatments for non-small cell lung cancer with Dr. Shirline Frees.  She completed her radiation and chemotherapy.  She started on Imfinzi.  She has had 2 cycles of Imfinzi however for the past 6 weeks she has had increased cough and shortness of breath.  Is been progressive.  And she feels like it has been nonproductive.  Not coughing up any blood.  She has not had any other sick symptoms but her shortness of breath is worse.  OV 06/27/2022: Here today for follow-up after being seen in the office with increased shortness of breath.  Was sent for imaging evaluation.  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  She also has irregular interstitial opacities throughout the lung consistent with inflammation.  OV 09/11/2022: Here today for follow-up.  She has follow-up CT imaging with a new right upper lobe nodule.  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  She has significant radiation-induced bronchiectasis with sputum production and cough.  From respiratory standpoint she is doing okay but still has persistent nagging cough.    Past Medical History:  Diagnosis Date   Hyperlipidemia    Hypertension    Lung mass    left upper lobe     No family history on file.   Past Surgical History:  Procedure Laterality Date   FINE NEEDLE ASPIRATION  02/12/2022   Procedure: FINE NEEDLE ASPIRATION (FNA) LINEAR;  Surgeon: Josephine Igo, DO;  Location: [facility];  Service: Pulmonary;;   IR IMAGING GUIDED PORT INSERTION  05/28/2022   VAGINAL HYSTERECTOMY     VIDEO BRONCHOSCOPY WITH ENDOBRONCHIAL ULTRASOUND Left 02/12/2022   Procedure: VIDEO BRONCHOSCOPY WITH ENDOBRONCHIAL ULTRASOUND;  Surgeon: Josephine Igo, DO;  Location: [facility];  Service: Pulmonary;  Laterality: Left;    Social History   Socioeconomic History   Marital status: Married    Spouse name: Delton See   Number of children: 4   Years of education: Not on file   Highest education level: Not on file  Occupational History   Not on file  Tobacco Use   Smoking status: Former    Packs/day: 1    Types: Cigarettes    Quit date: 12/23/2021    Years since quitting: 0.7    Passive exposure: Past   Smokeless tobacco: Never  Vaping Use   Vaping Use: Never used  Substance and Sexual Activity   Alcohol use:  Never   Drug use: Never   Sexual activity: Yes  Other Topics Concern   Not on file  Social History Narrative   Not on file   Social Determinants of Health   Financial Resource Strain: Not on file  Food Insecurity: Not on file  Transportation Needs: Not on file  Physical Activity: Not on file  Stress: Not on file  Social Connections: Not on file  Intimate Partner Violence: Not on file     Allergies  Allergen Reactions   No Known Allergies      Outpatient Medications Prior to Visit  Medication Sig Dispense Refill    apixaban (ELIQUIS) 5 MG TABS tablet Take 1 tablet (5 mg total) by mouth 2 (two) times daily. 60 tablet 1   benzonatate (TESSALON) 200 MG capsule Take 1 capsule (200 mg total) by mouth 3 (three) times daily as needed for cough. 90 capsule 2   ibuprofen (ADVIL) 200 MG tablet Take 200 mg by mouth every 8 (eight) hours as needed (pain.).     indapamide (LOZOL) 1.25 MG tablet Take 1.25 mg by mouth in the morning.     lidocaine-prilocaine (EMLA) cream Apply 1 Application topically as needed. 30 g 2   Polyvinyl Alcohol-Povidone PF (REFRESH) 1.4-0.6 % SOLN Place 1-2 drops into both eyes 3 (three) times daily as needed (dry/irritated eyes.).     rosuvastatin (CRESTOR) 10 MG tablet Take 10 mg by mouth in the morning.     lidocaine (XYLOCAINE) 2 % solution Patient: Mix 1part 2% viscous lidocaine, 1part H20. Swallow 10mL of diluted mixture, before meals and at bedtime, up to QID prn soreness (Patient not taking: Reported on 05/17/2022) 200 mL 3   prochlorperazine (COMPAZINE) 10 MG tablet Take 1 tablet (10 mg total) by mouth every 6 (six) hours as needed for nausea or vomiting. (Patient not taking: Reported on 05/17/2022) 30 tablet 0   No facility-administered medications prior to visit.    Review of Systems  Constitutional:  Negative for chills, fever, malaise/fatigue and weight loss.  HENT:  Negative for hearing loss, sore throat and tinnitus.   Eyes:  Negative for blurred vision and double vision.  Respiratory:  Positive for cough and sputum production. Negative for hemoptysis, shortness of breath, wheezing and stridor.   Cardiovascular:  Negative for chest pain, palpitations, orthopnea, leg swelling and PND.  Gastrointestinal:  Negative for abdominal pain, constipation, diarrhea, heartburn, nausea and vomiting.  Genitourinary:  Negative for dysuria, hematuria and urgency.  Musculoskeletal:  Negative for joint pain and myalgias.  Skin:  Negative for itching and rash.  Neurological:  Negative for  dizziness, tingling, weakness and headaches.  Endo/Heme/Allergies:  Negative for environmental allergies. Does not bruise/bleed easily.  Psychiatric/Behavioral:  Negative for depression. The patient is not nervous/anxious and does not have insomnia.   All other systems reviewed and are negative.    Objective:  Physical Exam Vitals reviewed.  Constitutional:      General: She is not in acute distress.    Appearance: She is well-developed.  HENT:     Head: Normocephalic and atraumatic.     Mouth/Throat:     Pharynx: No oropharyngeal exudate.  Eyes:     Conjunctiva/sclera: Conjunctivae normal.     Pupils: Pupils are equal, round, and reactive to light.  Neck:     Vascular: No JVD.     Trachea: No tracheal deviation.     Comments: Loss of supraclavicular fat Cardiovascular:     Rate and Rhythm: Normal  rate and regular rhythm.     Heart sounds: S1 normal and S2 normal.     Comments: Distant heart tones Pulmonary:     Effort: No tachypnea or accessory muscle usage.     Breath sounds: No stridor. Decreased breath sounds (throughout all lung fields) and rhonchi present. No wheezing or rales.  Abdominal:     General: Bowel sounds are normal. There is no distension.     Palpations: Abdomen is soft.     Tenderness: There is no abdominal tenderness.  Musculoskeletal:        General: Deformity (muscle wasting ) present.  Skin:    General: Skin is warm and dry.     Capillary Refill: Capillary refill takes less than 2 seconds.     Findings: No rash.  Neurological:     Mental Status: She is alert and oriented to person, place, and time.  Psychiatric:        Behavior: Behavior normal.      Vitals:   09/11/22 0847  BP: 100/70  Pulse: 67  SpO2: 95%  Weight: 171 lb 3.2 oz (77.7 kg)  Height: 5\' 4"  (1.626 m)   95% on RA BMI Readings from Last 3 Encounters:  09/11/22 29.39 kg/m  09/04/22 29.61 kg/m  07/04/22 29.58 kg/m   Wt Readings from Last 3 Encounters:  09/11/22 171 lb  3.2 oz (77.7 kg)  09/04/22 172 lb 8 oz (78.2 kg)  07/04/22 172 lb 4.8 oz (78.2 kg)     CBC    Component Value Date/Time   WBC 4.6 08/15/2022 1004   WBC 5.3 02/20/2022 1101   RBC 3.81 (L) 08/15/2022 1004   HGB 10.8 (L) 08/15/2022 1004   HCT 33.0 (L) 08/15/2022 1004   PLT 235 08/15/2022 1004   MCV 86.6 08/15/2022 1004   MCH 28.3 08/15/2022 1004   MCHC 32.7 08/15/2022 1004   RDW 14.6 08/15/2022 1004   LYMPHSABS 1.1 08/15/2022 1004   MONOABS 0.6 08/15/2022 1004   EOSABS 0.2 08/15/2022 1004   BASOSABS 0.0 08/15/2022 1004    Chest Imaging:  July 2023 lung cancer screening CT: AP window adenopathy, lung nodule in the left upper lobe Evidence of emphysema  Nuclear medicine pet imaging: Hypermetabolic cervical lymph node as well as hypermetabolic lesions within the chest. The patient's images have been independently reviewed by me.    HRCT imaging 06/24/2022: Extensive heterogenous and consolidative airspace disease within the perihilar left lung.  Suggestive of radiation pneumonitis bilateral interstitial thickening The patient's images have been independently reviewed by me.    April 2024 CT chest: Extensive radiation induced changes to the left chest, new nodule in the right. The patient's images have been independently reviewed by me.    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 Non-small cell carcinoma of left lung, stage 3  C34.92     3. Subacute cough  R05.2     4. Former smoker  Z87.891     5. Lung nodule  R91.1     6. Bronchiectasis without complication  J47.9       Discussion:  This is a 77 year old female initially seen for left lung mass diagnosed with a stage III non-small lung cancer  as underwent chemo and radiation developed radiation pneumonitis was started on  prednisone therapy improved now she has scarring and fibrosis with radiation-induced bronchiectasis.  Plan: Start nebulized albuterol New prescription for nebulizer machine Start Trelegy Follow-up CT imaging already scheduled for the right upper lobe nodule with Dr. Shirline Frees. Hopefully her cough will improve with better pulmonary hygiene. Start flutter valve If she does not improve with airway clearance techniques we could always consider vest therapy to help with sputum production management. RTC in 6 months.  Or sooner if symptoms continue to progress.    Current Outpatient Medications:    albuterol (PROVENTIL) (2.5 MG/3ML) 0.083% nebulizer solution, Take 3 mLs (2.5 mg total) by nebulization every 6 (six) hours as needed for wheezing or shortness of breath., Disp: 75 mL, Rfl: 12   apixaban (ELIQUIS) 5 MG TABS tablet, Take 1 tablet (5 mg total) by mouth 2 (two) times daily., Disp: 60 tablet, Rfl: 1   benzonatate (TESSALON) 200 MG capsule, Take 1 capsule (200 mg total) by mouth 3 (three) times daily as needed for cough., Disp: 90 capsule, Rfl: 2   Fluticasone-Umeclidin-Vilant (TRELEGY ELLIPTA) 100-62.5-25 MCG/ACT AEPB, Inhale 1 puff into the lungs once for 1 dose., Disp: 1 each, Rfl: 6   ibuprofen (ADVIL) 200 MG tablet, Take 200 mg by mouth every 8 (eight) hours as needed (pain.)., Disp: , Rfl:    indapamide (LOZOL) 1.25 MG tablet, Take 1.25 mg by mouth in the morning., Disp: , Rfl:    lidocaine-prilocaine (EMLA) cream, Apply 1 Application topically as needed., Disp: 30 g, Rfl: 2   Polyvinyl Alcohol-Povidone PF (REFRESH) 1.4-0.6 % SOLN, Place 1-2 drops into both eyes 3 (three) times daily as needed (dry/irritated eyes.)., Disp: , Rfl:    rosuvastatin (CRESTOR) 10 MG tablet, Take 10 mg by mouth in the morning., Disp: , Rfl:    lidocaine (XYLOCAINE) 2 % solution, Patient: Mix 1part 2% viscous lidocaine, 1part H20. Swallow 10mL of diluted mixture, before meals and at bedtime, up to QID prn  soreness (Patient not taking: Reported on 05/17/2022), Disp: 200 mL, Rfl: 3   prochlorperazine (COMPAZINE) 10 MG tablet, Take 1 tablet (10 mg total) by mouth every 6 (six) hours as needed for nausea or vomiting. (Patient not taking: Reported on 05/17/2022), Disp: 30 tablet, Rfl: 0    Josephine Igo, DO [facility] 09/11/2022 9:11 AM

## 2022-09-11 NOTE — Patient Instructions (Addendum)
Thank you for visiting Dr. Tonia Brooms at [facility]. Today we recommend the following:  Nebulizer machine  Albuterol solution  Flutter valve use as much as you need   Return in about 6 months (around 03/13/2023) for with APP.    Please do your part to reduce the spread of COVID-19.

## 2022-09-11 NOTE — Addendum Note (Signed)
Addended by: Hedda Slade on: 09/11/2022 10:02 AM   Modules accepted: Orders

## 2022-09-12 ENCOUNTER — Telehealth: Payer: Self-pay | Admitting: Pulmonary Disease

## 2022-09-12 NOTE — Telephone Encounter (Signed)
PT went to pick up her script at [facility] on [address].  She thought she was going to get a [employer] but they had Trelegy waiting there. She got the solution but still needs the machine.  Pls call pt to advise action taken @ [phone]

## 2022-09-12 NOTE — Telephone Encounter (Signed)
I spoke to pt and informed her that the nebulizer machine will come from [facility] and that will call her. And she also asked about TRELEGY ELLIPTA 100-62.5-25 MCG/ACT AEPB she stated the pharmacy didn't know what that was. I informed her I will call the pharmacy at 9 AM (when the pharmacy is open) to get clarification on this. Nothing further needed.UPDATE: pharmacy just needed clarification of pt instructions on how many times a day. I gave them the right instructions. Nothing further needed.

## 2022-09-16 ENCOUNTER — Telehealth: Payer: Self-pay | Admitting: Pulmonary Disease

## 2022-09-16 DIAGNOSIS — J479 Bronchiectasis, uncomplicated: Secondary | ICD-10-CM

## 2022-09-17 ENCOUNTER — Telehealth: Payer: Self-pay | Admitting: Pulmonary Disease

## 2022-09-17 NOTE — Telephone Encounter (Signed)
Patient called to request that she get an order for her nebulizer machine.  She stated she has the medication but does not have the medication.  Please advise and call patient to discuss at [phone]

## 2022-09-17 NOTE — Telephone Encounter (Signed)
Placed a new order with diagnosis bronchiectasis. Routing back to PCCs.

## 2022-09-17 NOTE — Telephone Encounter (Signed)
Dr. Tonia Brooms, please advise on this if you have a different diagnosis we can use for the neb order.

## 2022-09-18 NOTE — Telephone Encounter (Signed)
ATC X1 LVM for patient to call the office back. Please advise nebulizer medication was sent to pharmacy on 4/17

## 2022-09-19 NOTE — Telephone Encounter (Signed)
Order has been sent back and confirmed receipt of new order.

## 2022-09-25 ENCOUNTER — Telehealth: Payer: Self-pay | Admitting: Internal Medicine

## 2022-09-25 NOTE — Telephone Encounter (Signed)
Called patient regarding upcoming June appointments, patient is notified.  

## 2022-09-27 DIAGNOSIS — J479 Bronchiectasis, uncomplicated: Secondary | ICD-10-CM | POA: Diagnosis not present

## 2022-10-18 ENCOUNTER — Telehealth: Payer: Self-pay | Admitting: Medical Oncology

## 2022-10-18 DIAGNOSIS — I2699 Other pulmonary embolism without acute cor pulmonale: Secondary | ICD-10-CM

## 2022-10-18 MED ORDER — APIXABAN 5 MG PO TABS: 5.0000 mg | ORAL_TABLET | Freq: Two times a day (BID) | ORAL | 1 refills | Status: DC

## 2022-10-18 NOTE — Telephone Encounter (Signed)
Requests refill for Eloquis.

## 2022-10-23 ENCOUNTER — Telehealth: Payer: Self-pay | Admitting: Internal Medicine

## 2022-10-23 NOTE — Telephone Encounter (Signed)
Rescheduled 06/12 appointment to 06/17 due to provider pal, patient has been called and notified.

## 2022-10-28 DIAGNOSIS — J479 Bronchiectasis, uncomplicated: Secondary | ICD-10-CM | POA: Diagnosis not present

## 2022-11-04 ENCOUNTER — Inpatient Hospital Stay: Payer: Medicare Other

## 2022-11-04 ENCOUNTER — Other Ambulatory Visit: Payer: Self-pay

## 2022-11-04 ENCOUNTER — Ambulatory Visit (HOSPITAL_COMMUNITY)
Admission: RE | Admit: 2022-11-04 | Discharge: 2022-11-04 | Disposition: A | Discharge status: Home / Self Care | Payer: Medicare Other | Source: Ambulatory Visit | Attending: Internal Medicine | Admitting: Internal Medicine

## 2022-11-04 ENCOUNTER — Inpatient Hospital Stay: Payer: Medicare Other | Attending: Internal Medicine

## 2022-11-04 DIAGNOSIS — J479 Bronchiectasis, uncomplicated: Secondary | ICD-10-CM | POA: Insufficient documentation

## 2022-11-04 DIAGNOSIS — D649 Anemia, unspecified: Secondary | ICD-10-CM | POA: Insufficient documentation

## 2022-11-04 DIAGNOSIS — Z95828 Presence of other vascular implants and grafts: Secondary | ICD-10-CM

## 2022-11-04 DIAGNOSIS — R590 Localized enlarged lymph nodes: Secondary | ICD-10-CM | POA: Insufficient documentation

## 2022-11-04 DIAGNOSIS — C3490 Malignant neoplasm of unspecified part of unspecified bronchus or lung: Secondary | ICD-10-CM | POA: Insufficient documentation

## 2022-11-04 DIAGNOSIS — R0609 Other forms of dyspnea: Secondary | ICD-10-CM | POA: Insufficient documentation

## 2022-11-04 DIAGNOSIS — M2550 Pain in unspecified joint: Secondary | ICD-10-CM | POA: Insufficient documentation

## 2022-11-04 DIAGNOSIS — Z7901 Long term (current) use of anticoagulants: Secondary | ICD-10-CM | POA: Insufficient documentation

## 2022-11-04 DIAGNOSIS — Z90710 Acquired absence of both cervix and uterus: Secondary | ICD-10-CM | POA: Insufficient documentation

## 2022-11-04 DIAGNOSIS — Z79899 Other long term (current) drug therapy: Secondary | ICD-10-CM | POA: Insufficient documentation

## 2022-11-04 DIAGNOSIS — I700 Atherosclerosis of aorta: Secondary | ICD-10-CM | POA: Insufficient documentation

## 2022-11-04 DIAGNOSIS — R918 Other nonspecific abnormal finding of lung field: Secondary | ICD-10-CM | POA: Diagnosis not present

## 2022-11-04 DIAGNOSIS — D3501 Benign neoplasm of right adrenal gland: Secondary | ICD-10-CM | POA: Insufficient documentation

## 2022-11-04 DIAGNOSIS — C3412 Malignant neoplasm of upper lobe, left bronchus or lung: Secondary | ICD-10-CM | POA: Insufficient documentation

## 2022-11-04 DIAGNOSIS — Z86711 Personal history of pulmonary embolism: Secondary | ICD-10-CM | POA: Insufficient documentation

## 2022-11-04 DIAGNOSIS — M47814 Spondylosis without myelopathy or radiculopathy, thoracic region: Secondary | ICD-10-CM | POA: Insufficient documentation

## 2022-11-04 LAB — CBC WITH DIFFERENTIAL (CANCER CENTER ONLY)
Abs Immature Granulocytes: 0.01 10*3/uL (ref 0.00–0.07)
Basophils Absolute: 0 10*3/uL (ref 0.0–0.1)
Basophils Relative: 1 %
Eosinophils Absolute: 0.2 10*3/uL (ref 0.0–0.5)
Eosinophils Relative: 5 %
HCT: 35.5 % — ABNORMAL LOW (ref 36.0–46.0)
Hemoglobin: 11.5 g/dL — ABNORMAL LOW (ref 12.0–15.0)
Immature Granulocytes: 0 %
Lymphocytes Relative: 36 %
Lymphs Abs: 1.2 10*3/uL (ref 0.7–4.0)
MCH: 27.5 pg (ref 26.0–34.0)
MCHC: 32.4 g/dL (ref 30.0–36.0)
MCV: 84.9 fL (ref 80.0–100.0)
Monocytes Absolute: 0.4 10*3/uL (ref 0.1–1.0)
Monocytes Relative: 12 %
Neutro Abs: 1.5 10*3/uL — ABNORMAL LOW (ref 1.7–7.7)
Neutrophils Relative %: 46 %
Platelet Count: 203 10*3/uL (ref 150–400)
RBC: 4.18 MIL/uL (ref 3.87–5.11)
RDW: 14.3 % (ref 11.5–15.5)
WBC Count: 3.3 10*3/uL — ABNORMAL LOW (ref 4.0–10.5)
nRBC: 0 % (ref 0.0–0.2)

## 2022-11-04 LAB — CMP (CANCER CENTER ONLY)
ALT: 14 U/L (ref 0–44)
AST: 15 U/L (ref 15–41)
Albumin: 4.3 g/dL (ref 3.5–5.0)
Alkaline Phosphatase: 71 U/L (ref 38–126)
Anion gap: 6 (ref 5–15)
BUN: 28 mg/dL — ABNORMAL HIGH (ref 8–23)
CO2: 24 mmol/L (ref 22–32)
Calcium: 9.7 mg/dL (ref 8.9–10.3)
Chloride: 105 mmol/L (ref 98–111)
Creatinine: 1.28 mg/dL — ABNORMAL HIGH (ref 0.44–1.00)
GFR, Estimated: 43 mL/min — ABNORMAL LOW
Glucose, Bld: 92 mg/dL (ref 70–99)
Potassium: 4.8 mmol/L (ref 3.5–5.1)
Sodium: 135 mmol/L (ref 135–145)
Total Bilirubin: 0.3 mg/dL (ref 0.3–1.2)
Total Protein: 8 g/dL (ref 6.5–8.1)

## 2022-11-04 MED ORDER — HEPARIN SOD (PORK) LOCK FLUSH 100 UNIT/ML IV SOLN
INTRAVENOUS | Status: AC
  Filled 2022-11-04: qty 5

## 2022-11-04 MED ORDER — SODIUM CHLORIDE (PF) 0.9 % IJ SOLN
INTRAMUSCULAR | Status: AC
  Filled 2022-11-04: qty 50

## 2022-11-04 MED ORDER — SODIUM CHLORIDE 0.9% FLUSH
10.0000 mL | Freq: Once | INTRAVENOUS | Status: AC
  Administered 2022-11-04: 10 mL

## 2022-11-04 MED ORDER — HEPARIN SOD (PORK) LOCK FLUSH 100 UNIT/ML IV SOLN
500.0000 [IU] | Freq: Once | INTRAVENOUS | Status: AC
  Administered 2022-11-04: 500 [IU] via INTRAVENOUS

## 2022-11-04 MED ORDER — IOHEXOL 300 MG/ML  SOLN
60.0000 mL | Freq: Once | INTRAMUSCULAR | Status: AC | PRN
  Administered 2022-11-04: 60 mL via INTRAVENOUS

## 2022-11-06 ENCOUNTER — Ambulatory Visit: Payer: Medicare Other | Admitting: Physician Assistant

## 2022-11-08 NOTE — Progress Notes (Unsigned)
[facility] OFFICE PROGRESS NOTE  [name], MD [address] [place] [address]  DIAGNOSIS: 1) Stage IIIb (T1a, N3, M0) non-small cell lung cancer, adenosquamous carcinoma presented with left upper lobe pulmonary nodule in addition to left hilar, mediastinal and left supraclavicular and cervical lymphadenopathy diagnosed in September 2023. 2) incidental pulmonary embolism of segmental to subsegmental nonocclusive pulmonary embolism in the peripheral right lower lobe diagnosed in November 2023.   Detected Alteration(s) / Biomarker(s)          Associated FDA-approved therapies  Clinical Trial Availability          % cfDNA or Amplification   TP53 [id number] None Yes         1.6%   NFE2L2 D29V None Yes        1.2%   TP53 [id number] None Yes          0.4%   TMB 18.02 mut/Mb   PDL1 Expression: 80%  PRIOR THERAPY:  1) A course of concurrent chemoradiation with weekly carboplatin for AUC of 2 and paclitaxel 45 Mg/M2. First dose March 11, 2022. Status post 7 cycles of treatment last dose was given in April 15, 2022.  2) 1) Consolidation treatment with immunotherapy with Imfinzi 1500 mg IV every 4 weeks.  First dose May 09, 2022.  Status post 2 cycles.  This was discontinued secondary to pneumonitis.  CURRENT THERAPY: Surveillance   INTERVAL HISTORY: Yolanda Robertson 77 y.o. female returns to the clinic today for a follow-up visit.  The patient last saw Dr. Arbutus Ped on 09/04/22. The patient is on observation for her history of stage III lung cancer s/p chemort and consolidation immunotherapy. Her immunotherapy was discontinued due to pneumonitis, which required prednisone taper. At her last appointment in April, a restaging CT at that time showed a new 1.2 cm RUL nodular opacity that could be inflammatory vs neoplastic. Dr. Arbutus Ped recommended close monitoring with follow up imaging in 2 months, which she completed a few days ago.   Today, she is feeling well today.   She reports that her cough medication has helped and her cough is essentially resolved.  She does report some increased joint pain which she attributed to her Trelegy.  She is also on Crestor.  She sees her PCP tomorrow.  She denies any fever, chills, night sweats, or unexplained weight loss.  She denies any nausea, vomiting, diarrhea, or constipation.  Denies any chest pain or hemoptysis.  She reports mild dyspnea on exertion but this is still better than prior.  She has nebulizers if needed.  She reports some allergies for which she takes Careers adviser.  She mentions she may have occasional hoarseness but this typically occurs with worsening allergies and nasal drainage.  She sometimes may have a mild sinus headache but "nothing serious". Denies any rashes or skin changes.  She denies any abnormal bleeding or bruising and is tolerating her Eliquis well.  She is here today for evaluation and to review her scan results.   MEDICAL HISTORY: Past Medical History:  Diagnosis Date   Hyperlipidemia    Hypertension    Lung mass    left upper lobe    ALLERGIES:  is allergic to no known allergies.  MEDICATIONS:  Current Outpatient Medications  Medication Sig Dispense Refill   albuterol (PROVENTIL) (2.5 MG/3ML) 0.083% nebulizer solution Take 3 mLs (2.5 mg total) by nebulization every 6 (six) hours as needed for wheezing or shortness of breath. 75 mL 12  apixaban (ELIQUIS) 5 MG TABS tablet Take 1 tablet (5 mg total) by mouth 2 (two) times daily. 60 tablet 1   benzonatate (TESSALON) 200 MG capsule Take 1 capsule (200 mg total) by mouth 3 (three) times daily as needed for cough. 90 capsule 2   ibuprofen (ADVIL) 200 MG tablet Take 200 mg by mouth every 8 (eight) hours as needed (pain.).     indapamide (LOZOL) 1.25 MG tablet Take 1.25 mg by mouth in the morning.     lidocaine (XYLOCAINE) 2 % solution Patient: Mix 1part 2% viscous lidocaine, 1part H20. Swallow 10mL of diluted mixture, before meals and at  bedtime, up to QID prn soreness 200 mL 3   lidocaine-prilocaine (EMLA) cream Apply 1 Application topically as needed. 30 g 2   Polyvinyl Alcohol-Povidone PF (REFRESH) 1.4-0.6 % SOLN Place 1-2 drops into both eyes 3 (three) times daily as needed (dry/irritated eyes.).     prochlorperazine (COMPAZINE) 10 MG tablet Take 1 tablet (10 mg total) by mouth every 6 (six) hours as needed for nausea or vomiting. 30 tablet 0   rosuvastatin (CRESTOR) 10 MG tablet Take 10 mg by mouth in the morning.     TRELEGY ELLIPTA 100-62.5-25 MCG/ACT AEPB Inhale into the lungs.     No current facility-administered medications for this visit.    SURGICAL HISTORY:  Past Surgical History:  Procedure Laterality Date   FINE NEEDLE ASPIRATION  02/12/2022   Procedure: FINE NEEDLE ASPIRATION (FNA) LINEAR;  Surgeon: Josephine Igo, DO;  Location: [facility];  Service: Pulmonary;;   IR IMAGING GUIDED PORT INSERTION  05/28/2022   VAGINAL HYSTERECTOMY     VIDEO BRONCHOSCOPY WITH ENDOBRONCHIAL ULTRASOUND Left 02/12/2022   Procedure: VIDEO BRONCHOSCOPY WITH ENDOBRONCHIAL ULTRASOUND;  Surgeon: Josephine Igo, DO;  Location: [facility];  Service: Pulmonary;  Laterality: Left;    REVIEW OF SYSTEMS:   Review of Systems  Constitutional: Negative for appetite change, chills, fatigue, fever and unexpected weight change.  HENT: Negative for mouth sores, nosebleeds, sore throat and trouble swallowing.   Eyes: Negative for eye problems and icterus.  Respiratory: Positive for improving dyspnea exertion and cough.  Negative for  hemoptysis and wheezing.   Cardiovascular: Negative for chest pain and leg swelling.  Gastrointestinal: Negative for abdominal pain, constipation, diarrhea, nausea and vomiting.  Genitourinary: Negative for bladder incontinence, difficulty urinating, dysuria, frequency and hematuria.   Musculoskeletal: Negative for back pain, gait problem, neck pain and neck stiffness.  Skin: Negative for itching and rash.   Neurological: Negative for dizziness, extremity weakness, gait problem, headaches, light-headedness and seizures.  Hematological: Negative for adenopathy. Does not bruise/bleed easily.  Psychiatric/Behavioral: Negative for confusion, depression and sleep disturbance. The patient is not nervous/anxious.     PHYSICAL EXAMINATION:  Blood pressure 115/76, pulse 72, temperature 98.4 F (36.9 C), temperature source Oral, resp. rate 16, weight 181 lb (82.1 kg), SpO2 99 %.  ECOG PERFORMANCE STATUS: 1  Physical Exam  Constitutional: Oriented to person, place, and time and well-developed, well-nourished, and in no distress.   HENT:  Head: Normocephalic and atraumatic.  Mouth/Throat: Oropharynx is clear and moist. No oropharyngeal exudate.  Eyes: Conjunctivae are normal. Right eye exhibits no discharge. Left eye exhibits no discharge. No scleral icterus.  Neck: Normal range of motion. Neck supple.  Cardiovascular: Normal rate, regular rhythm, normal heart sounds and intact distal pulses.   Pulmonary/Chest: Effort normal and breath sounds normal. No respiratory distress. No wheezes. No rales.  Abdominal: Soft. Bowel sounds are  normal. Exhibits no distension and no mass. There is no tenderness.  Musculoskeletal: Normal range of motion. Exhibits no edema.  Lymphadenopathy:    No cervical adenopathy.  Neurological: Alert and oriented to person, place, and time. Exhibits normal muscle tone. Gait normal. Coordination normal.  Skin: Skin is warm and dry. No rash noted. Not diaphoretic. No erythema. No pallor.  Psychiatric: Mood, memory and judgment normal.  Vitals reviewed.  LABORATORY DATA: Lab Results  Component Value Date   WBC 3.3 (L) 11/04/2022   HGB 11.5 (L) 11/04/2022   HCT 35.5 (L) 11/04/2022   MCV 84.9 11/04/2022   PLT 203 11/04/2022      Chemistry      Component Value Date/Time   NA 135 11/04/2022 1050   K 4.8 11/04/2022 1050   CL 105 11/04/2022 1050   CO2 24 11/04/2022 1050    BUN 28 (H) 11/04/2022 1050   CREATININE 1.28 (H) 11/04/2022 1050      Component Value Date/Time   CALCIUM 9.7 11/04/2022 1050   ALKPHOS 71 11/04/2022 1050   AST 15 11/04/2022 1050   ALT 14 11/04/2022 1050   BILITOT 0.3 11/04/2022 1050       RADIOGRAPHIC STUDIES:  CT Chest W Contrast  Result Date: 11/09/2022 CLINICAL DATA:  Non-small-cell lung cancer. Restaging. * Tracking Code: BO * EXAM: CT CHEST WITH CONTRAST TECHNIQUE: Multidetector CT imaging of the chest was performed during intravenous contrast administration. RADIATION DOSE REDUCTION: This exam was performed according to the departmental dose-optimization program which includes automated exposure control, adjustment of the mA and/or kV according to patient size and/or use of iterative reconstruction technique. CONTRAST:  60mL OMNIPAQUE IOHEXOL 300 MG/ML  SOLN COMPARISON:  CT chest dated 08/15/2022 and multiple priors dating back to 12/17/2021 FINDINGS: Cardiovascular: Right chest wall port tip terminates in the right atrium. Normal heart size. No significant pericardial fluid/thickening. Great vessels are normal in course and caliber. No central pulmonary emboli. Coronary artery calcifications and aortic atherosclerosis. Mediastinum/Nodes: Imaged thyroid gland without nodules meeting criteria for imaging follow-up by size. Normal esophagus. No pathologically enlarged axillary, supraclavicular, mediastinal, or hilar lymph nodes. Previously enlarged AP window lymph node measures 9 mm (2:48). Lungs/Pleura: Curvilinear secretions within the distal trachea extending into the right main bronchus. Central airways are patent. Decreased size of subpleural irregular right upper lobe nodule measuring 1.1 x 0.7 cm (9:39), previously 1.2 x 0.9 cm. Diffuse interstitial opacities has decreased with residual areas of mild mosaic attenuation in the right upper lobe. 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 No new or enlarging pulmonary nodules. No pneumothorax. No pleural effusion. Upper abdomen: 2.5 cm right adrenal adenoma. No specific follow-up imaging recommended. No left adrenal nodule. Musculoskeletal: No acute or abnormal lytic or blastic osseous lesions. Multilevel degenerative changes of the thoracic spine. IMPRESSION: 1. Slightly decreased size of subpleural irregular right upper lobe nodule measuring average 1.1 x 0.7 cm, previously 1.2 x 0.9 cm. 2. 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 3. Decreased diffuse interstitial opacities with residual areas of mild mosaic attenuation in the right upper lobe, likely reflecting improving interstitial lung disease. 4. No new or enlarging pulmonary nodules.  No thoracic adenopathy. 5. Aortic Atherosclerosis (ICD10-I70.0). Coronary artery calcifications. Assessment for potential risk factor modification, dietary therapy or pharmacologic therapy may be warranted, if clinically indicated. Electronically Signed  By: Agustin Cree M.D.   On: 11/09/2022 13:45     ASSESSMENT/PLAN:  This is a very pleasant 77 year old African-American female with stage IIIb (T1a, N3, M0) non-small cell lung cancer, adenosquamous carcinoma.  The patient presented with a left upper lobe pulmonary nodule in addition to left hilar, mediastinal, left supraclavicular and cervical lymphadenopathy.  She was diagnosed in September 2023.   The patient does not have any actual mutations.  Her PD-L1 expression is 80%.    The patient underwent a course of concurrent chemoradiation with weekly carboplatin for an AUC 2 and paclitaxel 45 mg/m.  First dose was on 03/11/2022.  She is status post 7 cycles and the last dose was given on 04/15/2022.   She was undergoing consolidation immunotherapy  with Imfinzi 1500 mg IV every 4 weeks.  She is status post 2 cycles.  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 This was discontinued. She completed a prednisone taper.   At her last appointment in April 2024, her CT showed new RUL nodule opacity. Dr. Arbutus Ped recommended close interval follow up.   The patient was seen with Dr. Arbutus Ped. Dr. Arbutus Ped personally and independently reviewed the scan results and discussed the results with the patient.   The scan showed no evidence of disease progression.  However we will need to continue to monitor this area closely as she continues to have postradiation changes with some slight increased consolidation and architectural distortion.   Dr. Arbutus Ped recommends continue on observation with close monitoring.  We will arrange for restaging CT scan in 3 months.  We will see her back for follow-up visit after her CT scan to review the results.  I will arrange for her flush appointment every 6 to 8 weeks as the patient has a Port-A-Cath.  She is currently taking iron supplements for her anemia.    She will continue taking Eliquis due to her history of pulmonary embolism.  The patient was advised to call immediately if she has any concerning symptoms in the interval. The patient voices understanding of current disease status and treatment options and is in agreement with the current care plan. All questions were answered. The patient knows to call the clinic with any problems, questions or concerns. We can certainly see the patient much sooner if necessary          Orders Placed This Encounter  Procedures   CT Chest W Contrast    Standing Status:   Future    Standing Expiration Date:   11/11/2023    Order Specific Question:   If indicated for the ordered procedure, I authorize the administration of contrast media per Radiology protocol    Answer:   Yes    Order Specific Question:   Does the  patient have a contrast media/X-ray dye allergy?    Answer:   No    Order Specific Question:   Preferred imaging location?    Answer:   [facility]   CBC with Differential (Cancer Center Only)    Standing Status:   Future    Standing Expiration Date:   11/11/2023   CMP (Cancer Center only)    Standing Status:   Future    Standing Expiration Date:   11/11/2023      Johnette Abraham Abra Lingenfelter, PA-C 11/11/22  ADDENDUM: Hematology/Oncology Attending:  I had a face-to-face encounter with the patient today.  I reviewed her record, lab, scan and recommended her care plan.  This is a very pleasant 77 years old African-American female with history of stage IIIb non-small cell lung cancer, adenosquamous carcinoma diagnosed in September 2023 with no actionable mutations and PD-L1 expression of 80%.  The patient status post a course of concurrent chemoradiation followed by 2 cycles of consolidation treatment with immunotherapy discontinued secondary to suspicious immunotherapy mediated pneumonitis. She is currently on observation and she is feeling much better with less shortness of breath and cough. She had repeat CT scan of the chest performed recently.  I personally and independently reviewed the scan images and discussed the result with the patient and her husband. Her scan showed no concerning findings for disease progression but she continues to have evolving radiation changes and consolidation in the left lung. I recommended for the patient to continue on observation with repeat CT scan of the chest in 3 months. She was advised to call immediately if she has any other concerning symptoms in the interval. Disclaimer: This note was dictated with voice recognition software. Similar sounding words can inadvertently be transcribed and may be missed upon review. Lajuana Matte, MD

## 2022-11-11 ENCOUNTER — Inpatient Hospital Stay (HOSPITAL_BASED_OUTPATIENT_CLINIC_OR_DEPARTMENT_OTHER): Payer: Medicare Other | Admitting: Physician Assistant

## 2022-11-11 ENCOUNTER — Other Ambulatory Visit: Payer: Self-pay

## 2022-11-11 VITALS — BP 115/76 | HR 72 | Temp 98.4°F | Resp 16 | Wt 181.0 lb

## 2022-11-11 DIAGNOSIS — R590 Localized enlarged lymph nodes: Secondary | ICD-10-CM | POA: Diagnosis not present

## 2022-11-11 DIAGNOSIS — Z90710 Acquired absence of both cervix and uterus: Secondary | ICD-10-CM | POA: Diagnosis not present

## 2022-11-11 DIAGNOSIS — M2550 Pain in unspecified joint: Secondary | ICD-10-CM | POA: Diagnosis not present

## 2022-11-11 DIAGNOSIS — Z86711 Personal history of pulmonary embolism: Secondary | ICD-10-CM | POA: Diagnosis not present

## 2022-11-11 DIAGNOSIS — M47814 Spondylosis without myelopathy or radiculopathy, thoracic region: Secondary | ICD-10-CM | POA: Diagnosis not present

## 2022-11-11 DIAGNOSIS — D649 Anemia, unspecified: Secondary | ICD-10-CM | POA: Diagnosis not present

## 2022-11-11 DIAGNOSIS — C3492 Malignant neoplasm of unspecified part of left bronchus or lung: Secondary | ICD-10-CM | POA: Diagnosis not present

## 2022-11-11 DIAGNOSIS — J479 Bronchiectasis, uncomplicated: Secondary | ICD-10-CM | POA: Diagnosis not present

## 2022-11-11 DIAGNOSIS — Z79899 Other long term (current) drug therapy: Secondary | ICD-10-CM | POA: Diagnosis not present

## 2022-11-11 DIAGNOSIS — D3501 Benign neoplasm of right adrenal gland: Secondary | ICD-10-CM | POA: Diagnosis not present

## 2022-11-11 DIAGNOSIS — I700 Atherosclerosis of aorta: Secondary | ICD-10-CM | POA: Diagnosis not present

## 2022-11-11 DIAGNOSIS — R0609 Other forms of dyspnea: Secondary | ICD-10-CM | POA: Diagnosis not present

## 2022-11-11 DIAGNOSIS — C3412 Malignant neoplasm of upper lobe, left bronchus or lung: Secondary | ICD-10-CM | POA: Diagnosis not present

## 2022-11-11 DIAGNOSIS — Z7901 Long term (current) use of anticoagulants: Secondary | ICD-10-CM | POA: Diagnosis not present

## 2022-11-12 DIAGNOSIS — I10 Essential (primary) hypertension: Secondary | ICD-10-CM | POA: Diagnosis not present

## 2022-11-12 DIAGNOSIS — E1169 Type 2 diabetes mellitus with other specified complication: Secondary | ICD-10-CM | POA: Diagnosis not present

## 2022-11-12 DIAGNOSIS — M545 Low back pain, unspecified: Secondary | ICD-10-CM | POA: Diagnosis not present

## 2022-11-19 ENCOUNTER — Telehealth: Payer: Self-pay | Admitting: Internal Medicine

## 2022-11-19 NOTE — Telephone Encounter (Signed)
Scheduled per  

## 2022-11-19 NOTE — Telephone Encounter (Signed)
Scheduled per 06/17 los, patient has been called and voicemail was left regarding upcoming appointments.

## 2022-11-25 ENCOUNTER — Telehealth: Payer: Self-pay | Admitting: Pulmonary Disease

## 2022-11-25 NOTE — Telephone Encounter (Signed)
Spoke with patient regarding prior message. Patient stated her Trelegy 100 was to expensive offered patient samples of Trelegy until we can help patient with a cheaper inhaler.   Dr.Icard can you please advise.  Thank you

## 2022-11-25 NOTE — Telephone Encounter (Signed)
Patient states Trelegy too expensive at Pharmacy. Pharmacy is [facility]. Patient phone number is [phone].

## 2022-11-27 DIAGNOSIS — J479 Bronchiectasis, uncomplicated: Secondary | ICD-10-CM | POA: Diagnosis not present

## 2022-12-05 MED ORDER — BREZTRI AEROSPHERE 160-9-4.8 MCG/ACT IN AERO: 2.0000 | INHALATION_SPRAY | Freq: Two times a day (BID) | RESPIRATORY_TRACT | 5 refills | Status: DC

## 2022-12-05 NOTE — Telephone Encounter (Signed)
Spoke with patient regarding prior message.Patient is willing to try Breztri. Advised patient I would send a message back to Dr.Icard for approval .  Dr.Icard can you please advise.  Thank you

## 2022-12-05 NOTE — Telephone Encounter (Signed)
Spoke with patient to let her know a script for Markus Daft was sent to [facility]. Patient's voice was understanding.Nothing else further needed at this time.

## 2022-12-09 ENCOUNTER — Other Ambulatory Visit (HOSPITAL_COMMUNITY): Payer: Self-pay

## 2022-12-09 ENCOUNTER — Telehealth: Payer: Self-pay | Admitting: Pulmonary Disease

## 2022-12-09 ENCOUNTER — Encounter: Payer: Self-pay | Admitting: Internal Medicine

## 2022-12-09 NOTE — Telephone Encounter (Signed)
Trelegy also covered by plan at this time but patient has a deductible to meet which is making the co-pays higher. Per test claim Trelegy co-pay is [phone]

## 2022-12-09 NOTE — Telephone Encounter (Signed)
Patient states Yolanda Robertson is too expensive. Pharmacy is [facility]. Patient phone number is [phone].

## 2022-12-09 NOTE — Telephone Encounter (Signed)
Please run ticket on patients insurance to see what else is covered?  Route message back to triage

## 2022-12-17 NOTE — Telephone Encounter (Signed)
Called and spoke with pt about meeting deductible, pt did verbalize understanding. I have offered breztri samples and patient assistance forms. Will be left at front desk. Pt stated she can pick up tomorrow, will leave at the front desk

## 2022-12-18 ENCOUNTER — Other Ambulatory Visit: Payer: Self-pay | Admitting: Medical Oncology

## 2022-12-18 DIAGNOSIS — I2699 Other pulmonary embolism without acute cor pulmonale: Secondary | ICD-10-CM

## 2022-12-18 MED ORDER — APIXABAN 5 MG PO TABS
5.0000 mg | ORAL_TABLET | Freq: Two times a day (BID) | ORAL | 1 refills | Status: DC
Start: 2022-12-18 — End: 2023-02-17

## 2022-12-18 NOTE — Telephone Encounter (Signed)
Requested Eloquis refill.

## 2022-12-24 ENCOUNTER — Other Ambulatory Visit: Payer: Self-pay

## 2022-12-24 ENCOUNTER — Inpatient Hospital Stay: Payer: Medicare Other | Attending: Internal Medicine

## 2022-12-24 DIAGNOSIS — Z452 Encounter for adjustment and management of vascular access device: Secondary | ICD-10-CM | POA: Insufficient documentation

## 2022-12-24 DIAGNOSIS — C3412 Malignant neoplasm of upper lobe, left bronchus or lung: Secondary | ICD-10-CM | POA: Diagnosis not present

## 2022-12-24 DIAGNOSIS — Z95828 Presence of other vascular implants and grafts: Secondary | ICD-10-CM

## 2022-12-24 MED ORDER — SODIUM CHLORIDE 0.9% FLUSH
10.0000 mL | Freq: Once | INTRAVENOUS | Status: AC
  Administered 2022-12-24: 10 mL

## 2022-12-24 MED ORDER — HEPARIN SOD (PORK) LOCK FLUSH 100 UNIT/ML IV SOLN
500.0000 [IU] | Freq: Once | INTRAVENOUS | Status: AC
  Administered 2022-12-24: 500 [IU]

## 2022-12-28 DIAGNOSIS — J479 Bronchiectasis, uncomplicated: Secondary | ICD-10-CM | POA: Diagnosis not present

## 2023-01-24 DIAGNOSIS — I10 Essential (primary) hypertension: Secondary | ICD-10-CM | POA: Diagnosis not present

## 2023-01-24 DIAGNOSIS — G5601 Carpal tunnel syndrome, right upper limb: Secondary | ICD-10-CM | POA: Diagnosis not present

## 2023-01-24 DIAGNOSIS — C3490 Malignant neoplasm of unspecified part of unspecified bronchus or lung: Secondary | ICD-10-CM | POA: Diagnosis not present

## 2023-01-24 DIAGNOSIS — E1169 Type 2 diabetes mellitus with other specified complication: Secondary | ICD-10-CM | POA: Diagnosis not present

## 2023-01-24 DIAGNOSIS — E782 Mixed hyperlipidemia: Secondary | ICD-10-CM | POA: Diagnosis not present

## 2023-01-28 DIAGNOSIS — J479 Bronchiectasis, uncomplicated: Secondary | ICD-10-CM | POA: Diagnosis not present

## 2023-02-11 ENCOUNTER — Encounter (HOSPITAL_COMMUNITY): Payer: Self-pay

## 2023-02-11 ENCOUNTER — Inpatient Hospital Stay: Payer: Medicare Other | Attending: Internal Medicine

## 2023-02-11 ENCOUNTER — Ambulatory Visit (HOSPITAL_COMMUNITY)
Admission: RE | Admit: 2023-02-11 | Discharge: 2023-02-11 | Disposition: A | Discharge status: Home / Self Care | Payer: Medicare Other | Source: Ambulatory Visit | Attending: Physician Assistant | Admitting: Physician Assistant

## 2023-02-11 DIAGNOSIS — J479 Bronchiectasis, uncomplicated: Secondary | ICD-10-CM | POA: Insufficient documentation

## 2023-02-11 DIAGNOSIS — I700 Atherosclerosis of aorta: Secondary | ICD-10-CM | POA: Insufficient documentation

## 2023-02-11 DIAGNOSIS — J704 Drug-induced interstitial lung disorders, unspecified: Secondary | ICD-10-CM | POA: Insufficient documentation

## 2023-02-11 DIAGNOSIS — Z7901 Long term (current) use of anticoagulants: Secondary | ICD-10-CM | POA: Insufficient documentation

## 2023-02-11 DIAGNOSIS — C3492 Malignant neoplasm of unspecified part of left bronchus or lung: Secondary | ICD-10-CM

## 2023-02-11 DIAGNOSIS — Z90710 Acquired absence of both cervix and uterus: Secondary | ICD-10-CM | POA: Insufficient documentation

## 2023-02-11 DIAGNOSIS — J700 Acute pulmonary manifestations due to radiation: Secondary | ICD-10-CM | POA: Insufficient documentation

## 2023-02-11 DIAGNOSIS — Z95828 Presence of other vascular implants and grafts: Secondary | ICD-10-CM

## 2023-02-11 DIAGNOSIS — D649 Anemia, unspecified: Secondary | ICD-10-CM | POA: Insufficient documentation

## 2023-02-11 DIAGNOSIS — Z86711 Personal history of pulmonary embolism: Secondary | ICD-10-CM | POA: Insufficient documentation

## 2023-02-11 DIAGNOSIS — C3412 Malignant neoplasm of upper lobe, left bronchus or lung: Secondary | ICD-10-CM | POA: Insufficient documentation

## 2023-02-11 DIAGNOSIS — Z79899 Other long term (current) drug therapy: Secondary | ICD-10-CM | POA: Insufficient documentation

## 2023-02-11 DIAGNOSIS — C3411 Malignant neoplasm of upper lobe, right bronchus or lung: Secondary | ICD-10-CM | POA: Diagnosis not present

## 2023-02-11 DIAGNOSIS — M1990 Unspecified osteoarthritis, unspecified site: Secondary | ICD-10-CM | POA: Insufficient documentation

## 2023-02-11 LAB — CMP (CANCER CENTER ONLY)
ALT: 13 U/L (ref 0–44)
AST: 14 U/L — ABNORMAL LOW (ref 15–41)
Albumin: 4.1 g/dL (ref 3.5–5.0)
Alkaline Phosphatase: 70 U/L (ref 38–126)
Anion gap: 7 (ref 5–15)
BUN: 28 mg/dL — ABNORMAL HIGH (ref 8–23)
CO2: 24 mmol/L (ref 22–32)
Calcium: 9.3 mg/dL (ref 8.9–10.3)
Chloride: 105 mmol/L (ref 98–111)
Creatinine: 1.34 mg/dL — ABNORMAL HIGH (ref 0.44–1.00)
GFR, Estimated: 41 mL/min — ABNORMAL LOW (ref >60)
Glucose, Bld: 105 mg/dL — ABNORMAL HIGH (ref 70–99)
Potassium: 4.7 mmol/L (ref 3.5–5.1)
Sodium: 136 mmol/L (ref 135–145)
Total Bilirubin: 0.3 mg/dL (ref 0.3–1.2)
Total Protein: 7.6 g/dL (ref 6.5–8.1)

## 2023-02-11 LAB — CBC WITH DIFFERENTIAL (CANCER CENTER ONLY)
Abs Immature Granulocytes: 0.01 10*3/uL (ref 0.00–0.07)
Basophils Absolute: 0 10*3/uL (ref 0.0–0.1)
Basophils Relative: 1 %
Eosinophils Absolute: 0.2 10*3/uL (ref 0.0–0.5)
Eosinophils Relative: 4 %
HCT: 33.5 % — ABNORMAL LOW (ref 36.0–46.0)
Hemoglobin: 11.2 g/dL — ABNORMAL LOW (ref 12.0–15.0)
Immature Granulocytes: 0 %
Lymphocytes Relative: 32 %
Lymphs Abs: 1.2 10*3/uL (ref 0.7–4.0)
MCH: 28.5 pg (ref 26.0–34.0)
MCHC: 33.4 g/dL (ref 30.0–36.0)
MCV: 85.2 fL (ref 80.0–100.0)
Monocytes Absolute: 0.4 10*3/uL (ref 0.1–1.0)
Monocytes Relative: 11 %
Neutro Abs: 2 10*3/uL (ref 1.7–7.7)
Neutrophils Relative %: 52 %
Platelet Count: 206 10*3/uL (ref 150–400)
RBC: 3.93 MIL/uL (ref 3.87–5.11)
RDW: 14 % (ref 11.5–15.5)
WBC Count: 3.8 10*3/uL — ABNORMAL LOW (ref 4.0–10.5)
nRBC: 0 % (ref 0.0–0.2)

## 2023-02-11 MED ORDER — SODIUM CHLORIDE 0.9% FLUSH
10.0000 mL | Freq: Once | INTRAVENOUS | Status: AC
  Administered 2023-02-11: 10 mL

## 2023-02-11 MED ORDER — SODIUM CHLORIDE (PF) 0.9 % IJ SOLN
INTRAMUSCULAR | Status: AC
  Filled 2023-02-11: qty 50

## 2023-02-11 MED ORDER — IOHEXOL 300 MG/ML  SOLN
75.0000 mL | Freq: Once | INTRAMUSCULAR | Status: AC | PRN
  Administered 2023-02-11: 58 mL via INTRAVENOUS

## 2023-02-11 MED ORDER — HEPARIN SOD (PORK) LOCK FLUSH 100 UNIT/ML IV SOLN
INTRAVENOUS | Status: AC
  Filled 2023-02-11: qty 5

## 2023-02-11 MED ORDER — HEPARIN SOD (PORK) LOCK FLUSH 100 UNIT/ML IV SOLN
500.0000 [IU] | Freq: Once | INTRAVENOUS | Status: AC
  Administered 2023-02-11: 500 [IU] via INTRAVENOUS

## 2023-02-17 ENCOUNTER — Other Ambulatory Visit: Payer: Self-pay

## 2023-02-17 DIAGNOSIS — I2699 Other pulmonary embolism without acute cor pulmonale: Secondary | ICD-10-CM

## 2023-02-17 MED ORDER — APIXABAN 5 MG PO TABS
5.0000 mg | ORAL_TABLET | Freq: Two times a day (BID) | ORAL | 1 refills | Status: DC
Start: 2023-02-17 — End: 2023-07-21

## 2023-02-18 ENCOUNTER — Inpatient Hospital Stay (HOSPITAL_BASED_OUTPATIENT_CLINIC_OR_DEPARTMENT_OTHER): Payer: Medicare Other | Admitting: Internal Medicine

## 2023-02-18 VITALS — BP 123/86 | HR 84 | Temp 98.4°F | Resp 17 | Ht 64.0 in | Wt 187.1 lb

## 2023-02-18 DIAGNOSIS — I700 Atherosclerosis of aorta: Secondary | ICD-10-CM | POA: Diagnosis not present

## 2023-02-18 DIAGNOSIS — J704 Drug-induced interstitial lung disorders, unspecified: Secondary | ICD-10-CM | POA: Diagnosis not present

## 2023-02-18 DIAGNOSIS — C3490 Malignant neoplasm of unspecified part of unspecified bronchus or lung: Secondary | ICD-10-CM | POA: Diagnosis not present

## 2023-02-18 DIAGNOSIS — Z7901 Long term (current) use of anticoagulants: Secondary | ICD-10-CM | POA: Diagnosis not present

## 2023-02-18 DIAGNOSIS — C3412 Malignant neoplasm of upper lobe, left bronchus or lung: Secondary | ICD-10-CM | POA: Diagnosis not present

## 2023-02-18 DIAGNOSIS — Z86711 Personal history of pulmonary embolism: Secondary | ICD-10-CM | POA: Diagnosis not present

## 2023-02-18 DIAGNOSIS — Z90710 Acquired absence of both cervix and uterus: Secondary | ICD-10-CM | POA: Diagnosis not present

## 2023-02-18 DIAGNOSIS — Z79899 Other long term (current) drug therapy: Secondary | ICD-10-CM | POA: Diagnosis not present

## 2023-02-18 DIAGNOSIS — M1990 Unspecified osteoarthritis, unspecified site: Secondary | ICD-10-CM | POA: Diagnosis not present

## 2023-02-18 DIAGNOSIS — D649 Anemia, unspecified: Secondary | ICD-10-CM | POA: Diagnosis not present

## 2023-02-18 DIAGNOSIS — J700 Acute pulmonary manifestations due to radiation: Secondary | ICD-10-CM | POA: Diagnosis not present

## 2023-02-18 DIAGNOSIS — J479 Bronchiectasis, uncomplicated: Secondary | ICD-10-CM | POA: Diagnosis not present

## 2023-02-18 NOTE — Progress Notes (Signed)
[facility] [phone]   [phone]) [phone]  OFFICE PROGRESS NOTE  [name], MD [address] [place] [address]  DIAGNOSIS:  1) Stage IIIb (T1a, N3, M0) non-small cell lung cancer, adenosquamous carcinoma presented with left upper lobe pulmonary nodule in addition to left hilar, mediastinal and left supraclavicular and cervical lymphadenopathy diagnosed in September 2023. 2) incidental pulmonary embolism of segmental to subsegmental nonocclusive pulmonary embolism in the peripheral right lower lobe diagnosed in November 2023.  Detected Alteration(s) / Biomarker(s) Associated FDA-approved therapies Clinical Trial Availability % cfDNA or Amplification  TP53 R282W None Yes 1.6%  NFE2L2 D29V None Yes 1.2%  TP53 V216M None Yes 0.4%  TMB 18.02 mut/Mb  PDL1 Expression: 80%  PRIOR THERAPY:  1) A course of concurrent chemoradiation with weekly carboplatin for AUC of 2 and paclitaxel 45 Mg/M2.  First dose March 11, 2022.  Status post 7 cycles of treatment last dose was given in April 15, 2022. 2) Consolidation treatment with immunotherapy with Imfinzi 1500 mg IV every 4 weeks.  First dose May 09, 2022.  Status post 2 cycles.  Treatment discontinued secondary to suspicious immunotherapy mediated pneumonitis.  CURRENT THERAPY:  1) Eliquis 5 mg p.o. twice daily.  INTERVAL HISTORY: Yolanda Robertson 77 y.o. female is to the clinic today for follow-up visit accompanied by her husband.Discussed the use of AI scribe software for clinical note transcription with the patient, who gave verbal consent to proceed.  History of Present Illness   The patient, a 77 year old with a history of Stage IIIb (T1a, N3, M0) non-small cell lung cancer, adenosquamous carcinoma presented with left upper lobe pulmonary nodule in addition to left hilar, mediastinal and left supraclavicular and cervical lymphadenopathy diagnosed in September 2023. She presented  for a follow-up visit after initial treatment with chemoradiation and consolidation immunotherapy. The immunotherapy was discontinued due to the development of immune therapy pneumonitis. Since the last visit, the patient reported experiencing intermittent shortness of breath, particularly during periods of increased humidity. However, she denied any chest pain, cough, hemoptysis, nausea, vomiting, diarrhea, new headaches, or changes in vision. The patient also reported a weight gain of six pounds since the last visit. Despite the lung cancer diagnosis and treatment, her primary limitation in daily activities was reported to be arthritis, particularly affecting the back and legs.      [name]  MEDICAL HISTORY: Past Medical History:  Diagnosis Date   Hyperlipidemia    Hypertension    Lung mass    left upper lobe    ALLERGIES:  is allergic to no known allergies.  MEDICATIONS:  Current Outpatient Medications  Medication Sig Dispense Refill   albuterol (PROVENTIL) (2.5 MG/3ML) 0.083% nebulizer solution Take 3 mLs (2.5 mg total) by nebulization every 6 (six) hours as needed for wheezing or shortness of breath. 75 mL 12   apixaban (ELIQUIS) 5 MG TABS tablet Take 1 tablet (5 mg total) by mouth 2 (two) times daily. 60 tablet 1   benzonatate (TESSALON) 200 MG capsule Take 1 capsule (200 mg total) by mouth 3 (three) times daily as needed for cough. 90 capsule 2   Budeson-Glycopyrrol-Formoterol (BREZTRI AEROSPHERE) 160-9-4.8 MCG/ACT AERO Inhale 2 puffs into the lungs in the morning and at bedtime. 10.7 g 5   ibuprofen (ADVIL) 200 MG tablet Take 200 mg by mouth every 8 (eight) hours as needed (pain.).     indapamide (LOZOL) 1.25 MG tablet Take 1.25 mg by mouth in the  morning.     lidocaine (XYLOCAINE) 2 % solution Patient: Mix 1part 2% viscous lidocaine, 1part H20. Swallow 10mL of diluted mixture, before meals and at bedtime, up to QID prn soreness 200 mL 3   lidocaine-prilocaine (EMLA) cream Apply 1  Application topically as needed. 30 g 2   Polyvinyl Alcohol-Povidone PF (REFRESH) 1.4-0.6 % SOLN Place 1-2 drops into both eyes 3 (three) times daily as needed (dry/irritated eyes.).     prochlorperazine (COMPAZINE) 10 MG tablet Take 1 tablet (10 mg total) by mouth every 6 (six) hours as needed for nausea or vomiting. 30 tablet 0   rosuvastatin (CRESTOR) 10 MG tablet Take 10 mg by mouth in the morning.     TRELEGY ELLIPTA 100-62.5-25 MCG/ACT AEPB Inhale into the lungs.     No current facility-administered medications for this visit.    SURGICAL HISTORY:  Past Surgical History:  Procedure Laterality Date   FINE NEEDLE ASPIRATION  02/12/2022   Procedure: FINE NEEDLE ASPIRATION (FNA) LINEAR;  Surgeon: Josephine Igo, DO;  Location: [facility];  Service: Pulmonary;;   IR IMAGING GUIDED PORT INSERTION  05/28/2022   VAGINAL HYSTERECTOMY     VIDEO BRONCHOSCOPY WITH ENDOBRONCHIAL ULTRASOUND Left 02/12/2022   Procedure: VIDEO BRONCHOSCOPY WITH ENDOBRONCHIAL ULTRASOUND;  Surgeon: Josephine Igo, DO;  Location: [facility];  Service: Pulmonary;  Laterality: Left;    REVIEW OF SYSTEMS:  A comprehensive review of systems was negative except for: Constitutional: positive for fatigue Respiratory: positive for dyspnea on exertion Musculoskeletal: positive for arthralgias   PHYSICAL EXAMINATION: General appearance: alert, cooperative, fatigued, and no distress Head: Normocephalic, without obvious abnormality, atraumatic Neck: no adenopathy, no JVD, supple, symmetrical, trachea midline, and thyroid not enlarged, symmetric, no tenderness/mass/nodules Lymph nodes: Cervical, supraclavicular, and axillary nodes normal. Resp: clear to auscultation bilaterally Back: symmetric, no curvature. ROM normal. No CVA tenderness. Cardio: regular rate and rhythm, S1, S2 normal, no murmur, click, rub or gallop GI: soft, non-tender; bowel sounds normal; no masses,  no organomegaly Extremities: extremities normal,  atraumatic, no cyanosis or edema  ECOG PERFORMANCE STATUS: 1 - Symptomatic but completely ambulatory  Blood pressure 123/86, pulse 84, temperature 98.4 F (36.9 C), temperature source Oral, resp. rate 17, height 5\' 4"  (1.626 m), weight 187 lb 1 oz (84.9 kg), SpO2 98%.  LABORATORY DATA: Lab Results  Component Value Date   WBC 3.8 (L) 02/11/2023   HGB 11.2 (L) 02/11/2023   HCT 33.5 (L) 02/11/2023   MCV 85.2 02/11/2023   PLT 206 02/11/2023      Chemistry      Component Value Date/Time   NA 136 02/11/2023 1009   K 4.7 02/11/2023 1009   CL 105 02/11/2023 1009   CO2 24 02/11/2023 1009   BUN 28 (H) 02/11/2023 1009   CREATININE 1.34 (H) 02/11/2023 1009      Component Value Date/Time   CALCIUM 9.3 02/11/2023 1009   ALKPHOS 70 02/11/2023 1009   AST 14 (L) 02/11/2023 1009   ALT 13 02/11/2023 1009   BILITOT 0.3 02/11/2023 1009       RADIOGRAPHIC STUDIES: CT Chest W Contrast  Result Date: 02/18/2023 CLINICAL DATA:  Restaging non-small cell lung cancer (adenosquamous carcinoma in the left upper lobe diagnosed 1 year ago). * Tracking Code: BO * EXAM: CT CHEST WITH CONTRAST TECHNIQUE: Multidetector CT imaging of the chest was performed during intravenous contrast administration. RADIATION DOSE REDUCTION: This exam was performed according to the departmental dose-optimization program which includes automated exposure control, adjustment of  the mA and/or kV according to patient size and/or use of iterative reconstruction technique. CONTRAST:  58mL OMNIPAQUE IOHEXOL 300 MG/ML  SOLN COMPARISON:  Chest CT 11/04/2022 and 08/15/2022.  PET-CT 02/01/2022. FINDINGS: Cardiovascular: No acute vascular findings. Atherosclerosis of the aorta, great vessels and coronary arteries. Right IJ Port-A-Cath extends to the superior cavoatrial junction. The heart size is normal. There is no pericardial effusion. Mediastinum/Nodes: There are no enlarged mediastinal, hilar or axillary lymph nodes.Small mediastinal  lymph nodes are unchanged. The thyroid gland, trachea and esophagus demonstrate no significant findings. Lungs/Pleura: No pleural effusion or pneumothorax. Stable radiation changes in the medial aspect the left lung with associated volume loss, architectural distortion and traction bronchiectasis. Further improvement in previously demonstrated subpleural right upper lobe nodule which currently measures 4 mm on image 41/6, likely postinflammatory. No new or enlarging nodules identified. Upper abdomen: The visualized upper abdomen appears stable, without suspicious findings. There is a stable right adrenal nodule measuring 3.3 x 2.5 cm on image 136/2, previously characterized as an adenoma. No follow-up imaging recommended. Musculoskeletal/Chest wall: There is no chest wall mass or suspicious osseous finding. Stable multilevel spondylosis. Unless specific follow-up recommendations are mentioned in the findings or impression sections, no imaging follow-up of any mentioned incidental findings is recommended. IMPRESSION: 1. Stable radiation changes in the medial aspect of the left lung. 2. No evidence of local recurrence or metastatic disease. 3. Further improvement in previously demonstrated subpleural right upper lobe nodule, likely postinflammatory. 4. Stable right adrenal nodule, previously characterized as an adenoma. 5.  Aortic Atherosclerosis (ICD10-I70.0). Electronically Signed   By: Carey Bullocks M.D.   On: 02/18/2023 10:24    ASSESSMENT AND PLAN: This is a very pleasant 77 years old African-American female with Stage IIIb (T1a, N3, M0) non-small cell lung cancer, adenosquamous carcinoma presented with left upper lobe pulmonary nodule in addition to left hilar, mediastinal and left supraclavicular and cervical lymphadenopathy diagnosed in September 2023. Molecular studies showed no actionable mutations and PD-L1 expression is 80%. The patient also had bronchoscopy by Dr. Tonia Brooms and the final pathology was  consistent with adenosquamous carcinoma from the left upper lobe lung nodule. 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 The patient underwent a course of concurrent chemoradiation with weekly carboplatin for AUC of 2 and paclitaxel 45 Mg/M2.  First dose March 11, 2022.  Status post 7 cycles the last dose was given April 15, 2022. The patient underwent consolidation treatment with immunotherapy with Imfinzi 1500 Mg IV every 4 weeks status post 2 cycles.  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 Assessment and Plan    Stage IIIb (T1a, N3, M0) non-small cell lung cancer, adenosquamous carcinoma Stable since last visit. No new symptoms. No weight loss. Recent scan shows improvement in radiation changes. Currently on observation after discontinuation of immune therapy due to pneumonitis. -Continue observation. -Next follow-up in six months with a scan one week prior to visit.  Shortness of Breath Noted on exertion, worse with humidity. No associated chest pain or cough. No hemoptysis. -Continue monitoring symptoms.  Arthritis Limiting activity due to back and leg pain. -Continue current management.  Port Maintenance Port-a-cath in place. -Schedule port-a-cath flush every two months.     For the anemia, the patient will continue on oral ferrous sulfate with vitamin C. For the history of pulmonary embolism, she is currently on Eliquis 5 mg p.o. twice daily. The patient was advised  to call immediately if she has any other concerning symptoms in the interval. The patient voices understanding of current disease status and treatment options and is in agreement with the current care plan.  All questions were answered. The patient knows to call the clinic with any problems, questions or concerns. We can certainly see the patient much sooner if necessary.  The  total time spent in the appointment was 20 minutes.  Disclaimer: This note was dictated with voice recognition software. Similar sounding words can inadvertently be transcribed and may not be corrected upon review.

## 2023-02-27 DIAGNOSIS — J479 Bronchiectasis, uncomplicated: Secondary | ICD-10-CM | POA: Diagnosis not present

## 2023-02-28 DIAGNOSIS — E1169 Type 2 diabetes mellitus with other specified complication: Secondary | ICD-10-CM | POA: Diagnosis not present

## 2023-03-06 DIAGNOSIS — F17211 Nicotine dependence, cigarettes, in remission: Secondary | ICD-10-CM | POA: Diagnosis not present

## 2023-03-06 DIAGNOSIS — C3492 Malignant neoplasm of unspecified part of left bronchus or lung: Secondary | ICD-10-CM | POA: Diagnosis not present

## 2023-03-06 DIAGNOSIS — I10 Essential (primary) hypertension: Secondary | ICD-10-CM | POA: Diagnosis not present

## 2023-03-06 DIAGNOSIS — D6869 Other thrombophilia: Secondary | ICD-10-CM | POA: Diagnosis not present

## 2023-03-06 DIAGNOSIS — E279 Disorder of adrenal gland, unspecified: Secondary | ICD-10-CM | POA: Diagnosis not present

## 2023-03-06 DIAGNOSIS — R911 Solitary pulmonary nodule: Secondary | ICD-10-CM | POA: Diagnosis not present

## 2023-03-06 DIAGNOSIS — E785 Hyperlipidemia, unspecified: Secondary | ICD-10-CM | POA: Diagnosis not present

## 2023-03-06 DIAGNOSIS — Z79899 Other long term (current) drug therapy: Secondary | ICD-10-CM | POA: Diagnosis not present

## 2023-03-06 DIAGNOSIS — G622 Polyneuropathy due to other toxic agents: Secondary | ICD-10-CM | POA: Diagnosis not present

## 2023-03-06 DIAGNOSIS — Z136 Encounter for screening for cardiovascular disorders: Secondary | ICD-10-CM | POA: Diagnosis not present

## 2023-03-06 DIAGNOSIS — I700 Atherosclerosis of aorta: Secondary | ICD-10-CM | POA: Diagnosis not present

## 2023-03-06 DIAGNOSIS — Z1159 Encounter for screening for other viral diseases: Secondary | ICD-10-CM | POA: Diagnosis not present

## 2023-03-10 ENCOUNTER — Encounter: Payer: Self-pay | Admitting: Adult Health

## 2023-03-10 ENCOUNTER — Ambulatory Visit: Payer: Medicare Other | Admitting: Adult Health

## 2023-03-10 VITALS — BP 120/70 | HR 92 | Temp 98.3°F | Ht 64.0 in | Wt 186.4 lb

## 2023-03-10 DIAGNOSIS — J209 Acute bronchitis, unspecified: Secondary | ICD-10-CM | POA: Insufficient documentation

## 2023-03-10 DIAGNOSIS — C3492 Malignant neoplasm of unspecified part of left bronchus or lung: Secondary | ICD-10-CM

## 2023-03-10 DIAGNOSIS — J440 Chronic obstructive pulmonary disease with acute lower respiratory infection: Secondary | ICD-10-CM | POA: Diagnosis not present

## 2023-03-10 DIAGNOSIS — Z86711 Personal history of pulmonary embolism: Secondary | ICD-10-CM | POA: Insufficient documentation

## 2023-03-10 LAB — POC COVID19 BINAXNOW: SARS Coronavirus 2 Ag: NEGATIVE

## 2023-03-10 MED ORDER — PREDNISONE 20 MG PO TABS: 20.0000 mg | ORAL_TABLET | Freq: Every day | ORAL | 0 refills | Status: AC

## 2023-03-10 MED ORDER — AMOXICILLIN-POT CLAVULANATE 875-125 MG PO TABS: 1.0000 | ORAL_TABLET | Freq: Two times a day (BID) | ORAL | 0 refills | Status: AC

## 2023-03-10 MED ORDER — IPRATROPIUM BROMIDE 0.02 % IN SOLN: 0.5000 mg | Freq: Two times a day (BID) | RESPIRATORY_TRACT | 5 refills | Status: DC

## 2023-03-10 NOTE — Addendum Note (Signed)
Addended by: Delrae Rend on: 03/10/2023 03:25 PM   Modules accepted: Orders

## 2023-03-10 NOTE — Patient Instructions (Addendum)
Augmentin 875mg  Twice daily  for 1 week, take with food  Prednisone 20mg  daily for 5 days (take from your supply at home)  Robitussin DM As needed  cough/congestion  Begin albuterol and ipratropium nebulizer twice daily Albuterol inhaler or nebulizer as needed Use flutter valve twice daily after nebulizer Activity as tolerated Continue on Eliquis twice daily Continue follow-up with oncology with serial CT imaging Follow up with Dr. Tonia Brooms in 4 months with PFT  Please contact office for sooner follow up if symptoms do not improve or worsen or seek emergency care

## 2023-03-10 NOTE — Assessment & Plan Note (Signed)
Status post concurrent chemoradiation.  2 cycles of immunotherapy.  Most recent CT imaging February 18, 2023 showed no evidence of local recurrence or metastatic disease.  Stable radiation changes and improvement in the right upper lobe lung nodule Continue follow-up with oncology with serial imaging

## 2023-03-10 NOTE — Progress Notes (Signed)
@Patient  ID: Yolanda Robertson, female    DOB: 11-29-1945, 77 y.o.   MRN: [id number]  Chief Complaint  Patient presents with   Follow-up    Referring provider: [name], MD  HPI: 77 year old female former smoker followed for stage III non-small cell lung cancer (September 2023) with left lung mass status post chemo and radiation along with immunotherapy.  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  First dose March 11, 2022.  Status post 7 cycles of treatment last dose was given in April 15, 2022. 2) Consolidation treatment with immunotherapy with Imfinzi 1500 mg IV every 4 weeks.  First dose May 09, 2022.  Status post 2 cycles.  Treatment discontinued secondary to suspicious immunotherapy mediated pneumonitis. Molecular studies showed no actionable mutations and PD-L1 expression is 80%.   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  MRI brain February 09, 2022-normal with no evidence of metastatic disease   CT chest April 19, 2022 spiculated left upper lobe nodule almost completely resolved, significant interval decrease in previously seen left hilar lymph node, findings are consistent with treatment response of primary lung malignancy and nodal metastatic disease, positive PE peripheral right lower lobe  CT chest August 15, 2022 consolidative disease in the left  upper lobe and superior segment of the left lower lobe similar to June 24, 2022, 12 mm right upper lobe nodule new, stable scattered mediastinal lymph nodes   CT chest November 04, 2022 slightly decreased subpleural irregular right upper lobe lung nodule measuring 1.1 x 0.7 previously 1.2 x 0.9, evolution of postradiation changes in the left lung with slightly increased consolidation, volume loss and bronchiectasis, decreased diffuse interstitial opacities  CT chest February 11, 2023 stable radiation changes in the left lung.  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   03/10/2023 Follow up ; Emphysema , Lung cancer , PE  Returns for a 69-month follow-up.  Complains over the last 5 days that she has had increased cough, congestion, thick mucus and wheezing.  She has had increased her albuterol nebulizer use over the last few days.  She denies any fever, chest pain, orthopnea, edema.  COVID testing today in office was negative. Patient is a former smoker with emphysema noted on CT scan.  Last visit she was recommended to use Breztri but unfortunately is been unable to afford Breztri or Trelegy due to [employer] formulary cost.  She says she just cannot afford this plus her other medicines which are also expensive.  As above she has a history of stage III non-small cell lung cancer diagnosis September 2023 with a left lung mass and adenopathy.  She underwent concurrent chemo and radiation.  She did have immunotherapy x 2 cycles but was stopped due to immunotherapy induced pneumonitis.  She was  treated with steroid therapy with clinical and imaging improvement. Most recent serial CT chest February 11, 2023 showed stable radiation changes in the left lung.  No evidence of local recurrence or metastatic  disease.  And further improvement in the right upper lobe lung nodule.   Allergies  Allergen Reactions   No Known Allergies     Immunization History  Administered Date(s) Administered   Fluad Trivalent(High Dose 65+) 01/27/2023   Influenza-Unspecified 02/24/2022   PFIZER(Purple Top)SARS-COV-2 Vaccination 07/29/2019, 08/24/2019    Past Medical History:  Diagnosis Date   Hyperlipidemia    Hypertension    Lung mass    left upper lobe    Tobacco History: Social History   Tobacco Use  Smoking Status Former   Current packs/day: 0.00   Types: Cigarettes   Quit date: 12/23/2021   Years since quitting: 1.2   Passive exposure: Past  Smokeless Tobacco Never   Counseling given: Not Answered   Outpatient Medications Prior to Visit  Medication Sig Dispense Refill   albuterol (PROVENTIL) (2.5 MG/3ML) 0.083% nebulizer solution Take 3 mLs (2.5 mg total) by nebulization every 6 (six) hours as needed for wheezing or shortness of breath. 75 mL 12   apixaban (ELIQUIS) 5 MG TABS tablet Take 1 tablet (5 mg total) by mouth 2 (two) times daily. 60 tablet 1   ibuprofen (ADVIL) 200 MG tablet Take 200 mg by mouth every 8 (eight) hours as needed (pain.).     indapamide (LOZOL) 1.25 MG tablet Take 1.25 mg by mouth in the morning.     lidocaine (XYLOCAINE) 2 % solution Patient: Mix 1part 2% viscous lidocaine, 1part H20. Swallow 10mL of diluted mixture, before meals and at bedtime, up to QID prn soreness 200 mL 3   lidocaine-prilocaine (EMLA) cream Apply 1 Application topically as needed. 30 g 2   Polyvinyl Alcohol-Povidone PF (REFRESH) 1.4-0.6 % SOLN Place 1-2 drops into both eyes 3 (three) times daily as needed (dry/irritated eyes.).     rosuvastatin (CRESTOR) 10 MG tablet Take 10 mg by mouth in the morning.     TRELEGY ELLIPTA 100-62.5-25 MCG/ACT AEPB Inhale into the lungs.     benzonatate (TESSALON) 200 MG capsule Take 1 capsule (200 mg total) by mouth 3 (three) times daily as needed for  cough. (Patient not taking: Reported on 03/10/2023) 90 capsule 2   prochlorperazine (COMPAZINE) 10 MG tablet Take 1 tablet (10 mg total) by mouth every 6 (six) hours as needed for nausea or vomiting. (Patient not taking: Reported on 03/10/2023) 30 tablet 0   Budeson-Glycopyrrol-Formoterol (BREZTRI AEROSPHERE) 160-9-4.8 MCG/ACT AERO Inhale 2 puffs into the lungs in the morning and at bedtime. (Patient not taking: Reported on 03/10/2023) 10.7 g 5   No facility-administered medications prior to visit.     Review of Systems:   Constitutional:   No  weight loss, night sweats,  Fevers, chills,  +fatigue, or  lassitude.  HEENT:   No headaches,  Difficulty swallowing,  Tooth/dental problems, or  Sore throat,                No sneezing, itching, ear ache, nasal congestion, post nasal drip,   CV:  No chest pain,  Orthopnea, PND, swelling in lower extremities, anasarca, dizziness, palpitations, syncope.   GI  No heartburn, indigestion, abdominal pain, nausea, vomiting, diarrhea, change in bowel habits, loss of appetite, bloody stools.   Resp:   No chest wall deformity  Skin: no rash or lesions.  GU: no dysuria, change  in color of urine, no urgency or frequency.  No flank pain, no hematuria   MS:  No joint pain or swelling.  No decreased range of motion.  No back pain.    Physical Exam  BP 120/70 (BP Location: Left Arm, Patient Position: Sitting, Cuff Size: Normal)   Pulse 92   Temp 98.3 F (36.8 C) (Oral)   Ht 5\' 4"  (1.626 m)   Wt 186 lb 6.4 oz (84.6 kg)   SpO2 95%   BMI 32.00 kg/m   GEN: A/Ox3; pleasant , NAD, well nourished    HEENT:  [place]/AT,  NOSE-clear, THROAT-clear, no lesions, no postnasal drip or exudate noted.   NECK:  Supple w/ fair ROM; no JVD; normal carotid impulses w/o bruits; no thyromegaly or nodules palpated; no lymphadenopathy.    RESP few trace rhonchi and expiratory wheezes no accessory muscle use, no dullness to percussion  CARD:  RRR, no m/r/g, no peripheral  edema, pulses intact, no cyanosis or clubbing.  GI:   Soft & nt; nml bowel sounds; no organomegaly or masses detected.   Musco: Warm bil, no deformities or joint swelling noted.   Neuro: alert, no focal deficits noted.    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 * Tracking Code: BO * EXAM: CT CHEST WITH CONTRAST TECHNIQUE: Multidetector CT imaging of the chest was performed during intravenous contrast administration. RADIATION DOSE REDUCTION: This exam was performed according to the departmental dose-optimization program which includes automated exposure control, adjustment of the mA and/or kV according to patient size and/or use of iterative reconstruction technique. CONTRAST:  58mL OMNIPAQUE IOHEXOL 300 MG/ML  SOLN COMPARISON:  Chest CT 11/04/2022 and 08/15/2022.  PET-CT 02/01/2022. FINDINGS: Cardiovascular: No acute vascular findings. Atherosclerosis of the aorta, great vessels and coronary arteries. Right IJ Port-A-Cath extends to the superior cavoatrial junction. The heart size is normal. There is no pericardial effusion. Mediastinum/Nodes: There are no enlarged mediastinal, hilar or axillary lymph nodes.Small mediastinal lymph nodes are unchanged. The thyroid gland, trachea and esophagus demonstrate no significant findings. Lungs/Pleura: No pleural effusion or pneumothorax. Stable radiation changes in the medial aspect the left lung with associated volume loss, architectural distortion and traction bronchiectasis. Further improvement in previously demonstrated subpleural right upper lobe nodule which currently measures 4 mm on image 41/6, likely postinflammatory. No new or enlarging nodules identified. Upper abdomen: The  visualized upper abdomen appears stable, without suspicious findings. There is a stable right adrenal nodule measuring 3.3 x 2.5 cm on image 136/2, previously characterized as an adenoma. No follow-up imaging recommended. Musculoskeletal/Chest wall: There is no chest wall mass or suspicious osseous finding. Stable multilevel spondylosis. Unless specific follow-up recommendations are mentioned in the findings or impression sections, no imaging follow-up of any mentioned incidental findings is recommended. IMPRESSION: 1. Stable radiation changes in the medial aspect of the left lung. 2. No evidence of local recurrence or metastatic disease. 3. Further improvement in previously demonstrated subpleural right upper lobe nodule, likely postinflammatory. 4. Stable right adrenal nodule, previously characterized as an adenoma. 5.  Aortic Atherosclerosis (ICD10-I70.0). Electronically Signed   By: Carey Bullocks M.D.   On: 02/18/2023 10:24    sodium chloride flush (NS) 0.9 % injection 10 mL  Date Action Dose Route User   02/11/2023 1011 Given 10 mL Intracatheter Malissa Hippo, RN           No data to display          No results found for: "NITRICOXIDE"      Assessment & Plan:   Acute bronchitis with COPD (HCC) Acute bronchitis.  Will treat with empiric antibiotics and steroids. Add DuoNeb nebulizer twice daily.  Followed by flutter valve twice daily.  Plan  Patient Instructions  Augmentin 875mg  Twice daily  for 1 week, take with food  Prednisone 20mg  daily for 5 days (take from your supply at home)  Robitussin DM As needed  cough/congestion  Begin albuterol and ipratropium nebulizer twice daily Albuterol inhaler or nebulizer as needed Use flutter valve twice daily after nebulizer Activity as tolerated Continue on Eliquis twice daily Continue follow-up with oncology with serial CT imaging Follow up with Dr. Tonia Brooms in 4 months with PFT  Please contact office for sooner follow up if  symptoms do not improve or worsen or seek emergency care      Non-small cell carcinoma of left lung, stage 3 (HCC) Status post concurrent chemoradiation.  2 cycles of immunotherapy.  Most recent CT imaging February 18, 2023 showed no evidence of local recurrence or metastatic disease.  Stable radiation changes and improvement in the right upper lobe lung nodule Continue follow-up with oncology with serial imaging  History of pulmonary embolism History of PE in the setting of metastatic lung cancer-continue on Eliquis.    I spent  42  minutes dedicated to the care of this patient on the date of this encounter to include pre-visit review of records, face-to-face time with the patient discussing conditions above, post visit ordering of testing, clinical documentation with the electronic health record, making appropriate referrals as documented, and communicating necessary findings to members of the patients care team.   Rubye Oaks, NP 03/10/2023

## 2023-03-10 NOTE — Assessment & Plan Note (Signed)
History of PE in the setting of metastatic lung cancer-continue on Eliquis.

## 2023-03-10 NOTE — Assessment & Plan Note (Signed)
Acute bronchitis.  Will treat with empiric antibiotics and steroids. Add DuoNeb nebulizer twice daily.  Followed by flutter valve twice daily.  Plan  Patient Instructions  Augmentin 875mg  Twice daily  for 1 week, take with food  Prednisone 20mg  daily for 5 days (take from your supply at home)  Robitussin DM As needed  cough/congestion  Begin albuterol and ipratropium nebulizer twice daily Albuterol inhaler or nebulizer as needed Use flutter valve twice daily after nebulizer Activity as tolerated Continue on Eliquis twice daily Continue follow-up with oncology with serial CT imaging Follow up with Dr. Tonia Brooms in 4 months with PFT  Please contact office for sooner follow up if symptoms do not improve or worsen or seek emergency care

## 2023-03-26 DIAGNOSIS — D84821 Immunodeficiency due to drugs: Secondary | ICD-10-CM | POA: Diagnosis not present

## 2023-03-26 DIAGNOSIS — J432 Centrilobular emphysema: Secondary | ICD-10-CM | POA: Diagnosis not present

## 2023-03-26 DIAGNOSIS — G8929 Other chronic pain: Secondary | ICD-10-CM | POA: Diagnosis not present

## 2023-03-26 DIAGNOSIS — E785 Hyperlipidemia, unspecified: Secondary | ICD-10-CM | POA: Diagnosis not present

## 2023-03-26 DIAGNOSIS — E559 Vitamin D deficiency, unspecified: Secondary | ICD-10-CM | POA: Diagnosis not present

## 2023-03-26 DIAGNOSIS — I129 Hypertensive chronic kidney disease with stage 1 through stage 4 chronic kidney disease, or unspecified chronic kidney disease: Secondary | ICD-10-CM | POA: Diagnosis not present

## 2023-03-26 DIAGNOSIS — Z0001 Encounter for general adult medical examination with abnormal findings: Secondary | ICD-10-CM | POA: Diagnosis not present

## 2023-03-26 DIAGNOSIS — D689 Coagulation defect, unspecified: Secondary | ICD-10-CM | POA: Diagnosis not present

## 2023-03-26 DIAGNOSIS — C3492 Malignant neoplasm of unspecified part of left bronchus or lung: Secondary | ICD-10-CM | POA: Diagnosis not present

## 2023-03-30 DIAGNOSIS — J479 Bronchiectasis, uncomplicated: Secondary | ICD-10-CM | POA: Diagnosis not present

## 2023-04-04 DIAGNOSIS — E119 Type 2 diabetes mellitus without complications: Secondary | ICD-10-CM | POA: Diagnosis not present

## 2023-04-04 DIAGNOSIS — Z961 Presence of intraocular lens: Secondary | ICD-10-CM | POA: Diagnosis not present

## 2023-04-04 DIAGNOSIS — H40023 Open angle with borderline findings, high risk, bilateral: Secondary | ICD-10-CM | POA: Diagnosis not present

## 2023-04-21 ENCOUNTER — Inpatient Hospital Stay: Payer: Medicare Other | Attending: Internal Medicine

## 2023-04-21 VITALS — BP 121/82 | HR 78 | Temp 98.4°F | Resp 16

## 2023-04-21 DIAGNOSIS — C3412 Malignant neoplasm of upper lobe, left bronchus or lung: Secondary | ICD-10-CM | POA: Insufficient documentation

## 2023-04-21 DIAGNOSIS — Z95828 Presence of other vascular implants and grafts: Secondary | ICD-10-CM

## 2023-04-21 DIAGNOSIS — Z79899 Other long term (current) drug therapy: Secondary | ICD-10-CM | POA: Diagnosis not present

## 2023-04-21 DIAGNOSIS — Z452 Encounter for adjustment and management of vascular access device: Secondary | ICD-10-CM | POA: Diagnosis not present

## 2023-04-21 MED ORDER — HEPARIN SOD (PORK) LOCK FLUSH 100 UNIT/ML IV SOLN
500.0000 [IU] | Freq: Once | INTRAVENOUS | Status: AC
  Administered 2023-04-21: 500 [IU]

## 2023-04-21 MED ORDER — SODIUM CHLORIDE 0.9% FLUSH
10.0000 mL | Freq: Once | INTRAVENOUS | Status: AC
  Administered 2023-04-21: 10 mL

## 2023-06-09 ENCOUNTER — Other Ambulatory Visit: Payer: Self-pay | Admitting: Student

## 2023-06-09 DIAGNOSIS — Z1231 Encounter for screening mammogram for malignant neoplasm of breast: Secondary | ICD-10-CM

## 2023-06-23 ENCOUNTER — Inpatient Hospital Stay: Payer: Medicare Other | Attending: Internal Medicine

## 2023-06-23 ENCOUNTER — Other Ambulatory Visit: Payer: Self-pay

## 2023-06-23 DIAGNOSIS — Z452 Encounter for adjustment and management of vascular access device: Secondary | ICD-10-CM | POA: Diagnosis present

## 2023-06-23 DIAGNOSIS — C3412 Malignant neoplasm of upper lobe, left bronchus or lung: Secondary | ICD-10-CM | POA: Insufficient documentation

## 2023-06-23 DIAGNOSIS — Z95828 Presence of other vascular implants and grafts: Secondary | ICD-10-CM

## 2023-06-23 MED ORDER — HEPARIN SOD (PORK) LOCK FLUSH 100 UNIT/ML IV SOLN
500.0000 [IU] | Freq: Once | INTRAVENOUS | Status: AC
  Administered 2023-06-23: 500 [IU]

## 2023-06-23 MED ORDER — SODIUM CHLORIDE 0.9% FLUSH
10.0000 mL | Freq: Once | INTRAVENOUS | Status: AC
  Administered 2023-06-23: 10 mL

## 2023-07-01 ENCOUNTER — Other Ambulatory Visit: Payer: Self-pay | Admitting: *Deleted

## 2023-07-01 DIAGNOSIS — R918 Other nonspecific abnormal finding of lung field: Secondary | ICD-10-CM

## 2023-07-01 DIAGNOSIS — C3492 Malignant neoplasm of unspecified part of left bronchus or lung: Secondary | ICD-10-CM

## 2023-07-02 ENCOUNTER — Ambulatory Visit: Payer: Medicare Other | Admitting: Pulmonary Disease

## 2023-07-02 DIAGNOSIS — R918 Other nonspecific abnormal finding of lung field: Secondary | ICD-10-CM | POA: Diagnosis not present

## 2023-07-02 DIAGNOSIS — C3492 Malignant neoplasm of unspecified part of left bronchus or lung: Secondary | ICD-10-CM

## 2023-07-02 NOTE — Patient Instructions (Signed)
 Full PFT performed today.

## 2023-07-02 NOTE — Progress Notes (Signed)
 Full PFT performed today.

## 2023-07-03 LAB — PULMONARY FUNCTION TEST
DL/VA % pred: 96 %
DL/VA: 3.93 ml/min/mmHg/L
DLCO cor % pred: 56 %
DLCO cor: 10.69 ml/min/mmHg
DLCO unc % pred: 56 %
DLCO unc: 10.69 ml/min/mmHg
FEF 25-75 Post: 1.32 L/s
FEF 25-75 Pre: 1.24 L/s
FEF2575-%Change-Post: 6 %
FEF2575-%Pred-Post: 84 %
FEF2575-%Pred-Pre: 79 %
FEV1-%Change-Post: 0 %
FEV1-%Pred-Post: 67 %
FEV1-%Pred-Pre: 66 %
FEV1-Post: 1.38 L
FEV1-Pre: 1.37 L
FEV1FVC-%Change-Post: -1 %
FEV1FVC-%Pred-Pre: 109 %
FEV6-%Change-Post: 1 %
FEV6-%Pred-Post: 66 %
FEV6-%Pred-Pre: 64 %
FEV6-Post: 1.72 L
FEV6-Pre: 1.68 L
FEV6FVC-%Pred-Post: 105 %
FEV6FVC-%Pred-Pre: 105 %
FVC-%Change-Post: 1 %
FVC-%Pred-Post: 62 %
FVC-%Pred-Pre: 61 %
FVC-Post: 1.72 L
FVC-Pre: 1.68 L
Post FEV1/FVC ratio: 80 %
Post FEV6/FVC ratio: 100 %
Pre FEV1/FVC ratio: 81 %
Pre FEV6/FVC Ratio: 100 %
RV % pred: 108 %
RV: 2.53 L
TLC % pred: 84 %
TLC: 4.29 L

## 2023-07-21 ENCOUNTER — Ambulatory Visit: Payer: Medicare Other

## 2023-07-21 ENCOUNTER — Other Ambulatory Visit: Payer: Self-pay | Admitting: Internal Medicine

## 2023-07-21 DIAGNOSIS — E119 Type 2 diabetes mellitus without complications: Secondary | ICD-10-CM | POA: Insufficient documentation

## 2023-07-21 DIAGNOSIS — I2699 Other pulmonary embolism without acute cor pulmonale: Secondary | ICD-10-CM

## 2023-07-21 DIAGNOSIS — M5416 Radiculopathy, lumbar region: Secondary | ICD-10-CM | POA: Insufficient documentation

## 2023-07-21 DIAGNOSIS — Z860100 Personal history of colon polyps, unspecified: Secondary | ICD-10-CM | POA: Insufficient documentation

## 2023-07-21 DIAGNOSIS — I10 Essential (primary) hypertension: Secondary | ICD-10-CM | POA: Insufficient documentation

## 2023-07-22 ENCOUNTER — Encounter: Payer: Self-pay | Admitting: Adult Health

## 2023-07-22 ENCOUNTER — Ambulatory Visit (INDEPENDENT_AMBULATORY_CARE_PROVIDER_SITE_OTHER): Payer: Medicare Other | Admitting: Adult Health

## 2023-07-22 VITALS — BP 118/76 | HR 89 | Ht 64.0 in | Wt 182.0 lb

## 2023-07-22 DIAGNOSIS — C3492 Malignant neoplasm of unspecified part of left bronchus or lung: Secondary | ICD-10-CM | POA: Diagnosis not present

## 2023-07-22 DIAGNOSIS — Z86711 Personal history of pulmonary embolism: Secondary | ICD-10-CM | POA: Diagnosis not present

## 2023-07-22 DIAGNOSIS — J432 Centrilobular emphysema: Secondary | ICD-10-CM | POA: Insufficient documentation

## 2023-07-22 NOTE — Assessment & Plan Note (Signed)
 Emphysema/bronchitis appears to be stable.  Has perceived benefit on DuoNeb nebulizer twice daily.  PFTs today show moderate restriction.  Continue on current regimen.  Appears to be stable.

## 2023-07-22 NOTE — Assessment & Plan Note (Signed)
 History of PE in the setting of metastatic lung cancer-continue on Eliquis.  Patient education given on anticoagulation therapy.

## 2023-07-22 NOTE — Patient Instructions (Signed)
 Robitussin DM As needed  cough/congestion  Continue on albuterol and ipratropium nebulizer twice daily Albuterol inhaler or nebulizer as needed Use flutter valve twice daily after nebulizer Activity as tolerated Continue on Eliquis twice daily Do not use any NSAIDs -ibuprofen, aleve , excedrin, motrin, etc.  Continue follow-up with oncology with serial CT imaging Follow up with Dr. Delton Coombes or Artemio Dobie NP in 6 months and As needed   Please contact office for sooner follow up if symptoms do not improve or worsen or seek emergency care

## 2023-07-22 NOTE — Assessment & Plan Note (Signed)
 Status post concurrent chemoradiation.  2 cycles of immunotherapy.  Most recent CT imaging February 18, 2023 showed no evidence of local recurrence or metastatic disease.  Stable radiation changes and improvement in the right upper lobe lung nodule Continue follow-up with oncology with serial imaging next month as planned

## 2023-07-22 NOTE — Progress Notes (Signed)
 @Patient  ID: Yolanda Robertson, female    DOB: 04-Sep-1945, 78 y.o.   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  Patient with radiation-induced bronchiectasis History of PE Dx November 2023, Emphysema   Oncology notes   A course of concurrent chemoradiation with weekly carboplatin for AUC of 2 and paclitaxel 45 Mg/M2.  First dose March 11, 2022.  Status post 7 cycles of treatment last dose was given in April 15, 2022. 2) Consolidation treatment with immunotherapy with Imfinzi 1500 mg IV every 4 weeks.  First dose May 09, 2022.  Status post 2 cycles.  Treatment discontinued secondary to suspicious immunotherapy mediated pneumonitis. Molecular studies showed no actionable mutations and PD-L1 expression is 80%.   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   MRI brain February 09, 2022-normal with no evidence of metastatic disease     CT chest April 19, 2022 spiculated left upper lobe nodule almost completely resolved, significant interval decrease in previously seen left hilar lymph node, findings are consistent with treatment response of primary lung malignancy and nodal metastatic disease, positive PE peripheral right lower lobe   CT chest August 15, 2022 consolidative disease in the left upper lobe and superior segment of the left lower lobe similar to June 24, 2022, 12 mm right upper lobe nodule new, stable scattered mediastinal lymph nodes     CT chest November 04, 2022 slightly decreased subpleural irregular right upper lobe lung nodule measuring 1.1 x 0.7 previously 1.2 x 0.9, evolution of postradiation changes in the left lung with slightly increased consolidation, volume loss and bronchiectasis, decreased diffuse interstitial opacities   CT chest February 11, 2023 stable radiation changes in the left lung.  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   07/22/2023 Follow up: Emphysema, lung cancer, PE Patient presents for a 70-month follow-up.  Patient is followed for emphysema.  Last visit she was treated for acute bronchitis.  She was treated with Augmentin and a short prednisone burst. She is feeling better. She was set up for pulmonary function testing that was done on July 02, 2023 that showed moderate restriction with an FEV1 at 67%, ratio 80, FVC 62%, no significant bronchodilator response, DLCO 56%. She previously been recommended to try [facility] or Trelegy but unfortunately was cost prohibitive.  She was started on DuoNeb nebulizer twice daily last visit.  Since last visit patient is feeling good, back to baseline. Gets some thick mucus first thing in the morning. Does feel Duoneb has really made a difference and is helping her breathing.   She has a history of PE in the setting of metastatic lung cancer.  She is on  chronic anticoagulation therapy with Eliquis.  Denies any known bleeding.  Patient is occasion given on anticoagulation.  Non-small cell carcinoma of the left lung- stage III.  Status post concurrent chemoradiation and 2  cycles of immunotherapy.  CT chest September 2024 showed stable radiation changes in the left lung.  No evidence of recurrent or metastatic disease.  She has an upcoming CT chest next month and follow-up with oncology.  Denies any hemoptysis or unintentional weight loss     Allergies  Allergen Reactions   No Known Allergies     Immunization History  Administered Date(s) Administered   Fluad Quad(high Dose 65+) 02/19/2023   Fluad Trivalent(High Dose 65+) 01/27/2023   Influenza-Unspecified 02/24/2022   PFIZER(Purple Top)SARS-COV-2 Vaccination 07/29/2019, 08/24/2019   Pfizer(Comirnaty)Fall Seasonal Vaccine 12 years and older 02/19/2023    Past Medical History:  Diagnosis Date   Hyperlipidemia    Hypertension    Lung mass    left upper lobe    Tobacco History: Social History   Tobacco Use  Smoking Status Former   Current packs/day: 0.00   Types: Cigarettes   Quit date: 12/23/2021   Years since quitting: 1.5   Passive exposure: Past  Smokeless Tobacco Never   Counseling given: Not Answered   Outpatient Medications Prior to Visit  Medication Sig Dispense Refill   albuterol (PROVENTIL) (2.5 MG/3ML) 0.083% nebulizer solution Take 3 mLs (2.5 mg total) by nebulization every 6 (six) hours as needed for wheezing or shortness of breath. 75 mL 12   amoxicillin-clavulanate (AUGMENTIN) 875-125 MG tablet Take 1 tablet by mouth 2 (two) times daily. 14 tablet 0   EDARBI 80 MG TABS Take 1 tablet by mouth daily.     ELIQUIS 5 MG TABS tablet Take 1 tablet by mouth twice daily 60 tablet 0   ibuprofen (ADVIL) 200 MG tablet Take 200 mg by mouth every 8 (eight) hours as needed (pain.).     indapamide (LOZOL) 1.25 MG tablet Take 1.25 mg by mouth in the morning.     ipratropium (ATROVENT) 0.02 % nebulizer solution Take 2.5 mLs (0.5 mg total) by nebulization 2 (two) times daily. 150 mL 5   lidocaine (XYLOCAINE) 2 % solution Patient: Mix 1part 2% viscous lidocaine, 1part H20. Swallow 10mL of  diluted mixture, before meals and at bedtime, up to QID prn soreness 200 mL 3   lidocaine-prilocaine (EMLA) cream Apply 1 Application topically as needed. 30 g 2   methocarbamol (ROBAXIN) 500 MG tablet Take 500 mg by mouth every 8 (eight) hours as needed.     Polyvinyl Alcohol-Povidone PF (REFRESH) 1.4-0.6 % SOLN Place 1-2 drops into both eyes 3 (three) times daily as needed (dry/irritated eyes.).     predniSONE (DELTASONE) 20 MG tablet Take 1 tablet (20 mg total) by mouth daily with breakfast. 5 tablet 0   rosuvastatin (CRESTOR) 10 MG tablet Take 10 mg by mouth in the morning.     Vitamin D, Ergocalciferol, (DRISDOL) 1.25 MG (50000 UNIT) CAPS capsule Take 50,000 Units by mouth once a week.     benzonatate (TESSALON) 200 MG capsule Take 1 capsule (200 mg total) by mouth 3 (three) times daily as needed for cough. (Patient not taking: Reported on 03/10/2023) 90 capsule 2   prochlorperazine (COMPAZINE) 10 MG tablet Take 1 tablet (10 mg total) by mouth every 6 (six) hours as needed for nausea or vomiting. (Patient not taking: Reported on 03/10/2023) 30 tablet 0   No facility-administered medications prior to visit.  Review of Systems:   Constitutional:   No  weight loss, night sweats,  Fevers, chills,+ fatigue, or  lassitude.  HEENT:   No headaches,  Difficulty swallowing,  Tooth/dental problems, or  Sore throat,                No sneezing, itching, ear ache, nasal congestion, post nasal drip,   CV:  No chest pain,  Orthopnea, PND, swelling in lower extremities, anasarca, dizziness, palpitations, syncope.   GI  No heartburn, indigestion, abdominal pain, nausea, vomiting, diarrhea, change in bowel habits, loss of appetite, bloody stools.   Resp:   No chest wall deformity  Skin: no rash or lesions.  GU: no dysuria, change in color of urine, no urgency or frequency.  No flank pain, no hematuria   MS:  No joint pain or swelling.  No decreased range of motion.  No back  pain.    Physical Exam  BP 118/76   Pulse 89   Ht 5\' 4"  (1.626 m)   Wt 182 lb (82.6 kg)   SpO2 96%   BMI 31.24 kg/m   GEN: A/Ox3; pleasant , NAD, well nourished    HEENT:  [place]/AT,  NOSE-clear, THROAT-clear, no lesions, no postnasal drip or exudate noted.   NECK:  Supple w/ fair ROM; no JVD; normal carotid impulses w/o bruits; no thyromegaly or nodules palpated; no lymphadenopathy.    RESP  Clear  P & A; w/o, wheezes/ rales/ or rhonchi. no accessory muscle use, no dullness to percussion  CARD:  RRR, no m/r/g, no peripheral edema, pulses intact, no cyanosis or clubbing.  GI:   Soft & nt; nml bowel sounds; no organomegaly or masses detected.   Musco: Warm bil, no deformities or joint swelling noted.   Neuro: alert, no focal deficits noted.    Skin: Warm, no lesions or rashes    Lab Results:  BNP No results found for: "BNP"  ProBNP No results found for: "PROBNP"  Imaging: No results found.  heparin lock flush 100 unit/mL     Date Action Dose Route User   06/23/2023 1336 Given 500 Units Intracatheter Wagner, Alissa H, LPN      sodium chloride flush (NS) 0.9 % injection 10 mL     Date Action Dose Route User   06/23/2023 1336 Given 10 mL Intracatheter Hazle Quant, LPN          Latest Ref Rng & Units 07/02/2023   12:52 PM  PFT Results  FVC-Pre L 1.68   FVC-Predicted Pre % 61   FVC-Post L 1.72   FVC-Predicted Post % 62   Pre FEV1/FVC % % 81   Post FEV1/FCV % % 80   FEV1-Pre L 1.37   FEV1-Predicted Pre % 66   FEV1-Post L 1.38   DLCO uncorrected ml/min/mmHg 10.69   DLCO UNC% % 56   DLCO corrected ml/min/mmHg 10.69   DLCO COR %Predicted % 56   DLVA Predicted % 96   TLC L 4.29   TLC % Predicted % 84   RV % Predicted % 108     No results found for: "NITRICOXIDE"      Assessment & Plan:   Non-small cell carcinoma of left lung, stage 3 (HCC) Status post concurrent chemoradiation.  2 cycles of immunotherapy.  Most recent CT imaging February 18, 2023 showed no evidence of local recurrence or metastatic disease.  Stable radiation changes and improvement in the right upper lobe lung nodule Continue follow-up with oncology  with serial imaging next month as planned   History of pulmonary embolism History of PE in the setting of metastatic lung cancer-continue on Eliquis.  Patient education given on anticoagulation therapy.  Centrilobular emphysema (HCC) Emphysema/bronchitis appears to be stable.  Has perceived benefit on DuoNeb nebulizer twice daily.  PFTs today show moderate restriction.  Continue on current regimen.  Appears to be stable.     Rubye Oaks, NP 07/22/2023

## 2023-07-30 ENCOUNTER — Ambulatory Visit
Admission: RE | Admit: 2023-07-30 | Discharge: 2023-07-30 | Disposition: A | Discharge status: Home / Self Care | Payer: Medicare Other | Source: Ambulatory Visit | Attending: Student

## 2023-07-30 DIAGNOSIS — C801 Malignant (primary) neoplasm, unspecified: Secondary | ICD-10-CM | POA: Insufficient documentation

## 2023-07-30 DIAGNOSIS — Z1231 Encounter for screening mammogram for malignant neoplasm of breast: Secondary | ICD-10-CM

## 2023-08-11 ENCOUNTER — Ambulatory Visit (HOSPITAL_COMMUNITY)
Admission: RE | Admit: 2023-08-11 | Discharge: 2023-08-11 | Disposition: A | Discharge status: Home / Self Care | Payer: Medicare Other | Source: Ambulatory Visit | Attending: Internal Medicine | Admitting: Internal Medicine

## 2023-08-11 ENCOUNTER — Encounter (HOSPITAL_COMMUNITY): Payer: Self-pay

## 2023-08-11 ENCOUNTER — Inpatient Hospital Stay: Payer: Medicare Other | Attending: Internal Medicine

## 2023-08-11 DIAGNOSIS — Z9226 Personal history of immune checkpoint inhibitor therapy: Secondary | ICD-10-CM | POA: Diagnosis not present

## 2023-08-11 DIAGNOSIS — C3490 Malignant neoplasm of unspecified part of unspecified bronchus or lung: Secondary | ICD-10-CM

## 2023-08-11 DIAGNOSIS — I700 Atherosclerosis of aorta: Secondary | ICD-10-CM | POA: Diagnosis not present

## 2023-08-11 DIAGNOSIS — I10 Essential (primary) hypertension: Secondary | ICD-10-CM | POA: Insufficient documentation

## 2023-08-11 DIAGNOSIS — I2699 Other pulmonary embolism without acute cor pulmonale: Secondary | ICD-10-CM | POA: Diagnosis not present

## 2023-08-11 DIAGNOSIS — J704 Drug-induced interstitial lung disorders, unspecified: Secondary | ICD-10-CM | POA: Insufficient documentation

## 2023-08-11 DIAGNOSIS — Z90710 Acquired absence of both cervix and uterus: Secondary | ICD-10-CM | POA: Diagnosis not present

## 2023-08-11 DIAGNOSIS — Z9221 Personal history of antineoplastic chemotherapy: Secondary | ICD-10-CM | POA: Insufficient documentation

## 2023-08-11 DIAGNOSIS — Z7901 Long term (current) use of anticoagulants: Secondary | ICD-10-CM | POA: Insufficient documentation

## 2023-08-11 DIAGNOSIS — Z923 Personal history of irradiation: Secondary | ICD-10-CM | POA: Diagnosis not present

## 2023-08-11 DIAGNOSIS — Z86711 Personal history of pulmonary embolism: Secondary | ICD-10-CM | POA: Diagnosis not present

## 2023-08-11 DIAGNOSIS — D3501 Benign neoplasm of right adrenal gland: Secondary | ICD-10-CM | POA: Diagnosis not present

## 2023-08-11 DIAGNOSIS — C3412 Malignant neoplasm of upper lobe, left bronchus or lung: Secondary | ICD-10-CM | POA: Diagnosis present

## 2023-08-11 DIAGNOSIS — Z79899 Other long term (current) drug therapy: Secondary | ICD-10-CM | POA: Insufficient documentation

## 2023-08-11 DIAGNOSIS — J700 Acute pulmonary manifestations due to radiation: Secondary | ICD-10-CM | POA: Diagnosis not present

## 2023-08-11 DIAGNOSIS — D649 Anemia, unspecified: Secondary | ICD-10-CM | POA: Insufficient documentation

## 2023-08-11 DIAGNOSIS — J479 Bronchiectasis, uncomplicated: Secondary | ICD-10-CM | POA: Insufficient documentation

## 2023-08-11 DIAGNOSIS — R911 Solitary pulmonary nodule: Secondary | ICD-10-CM | POA: Diagnosis not present

## 2023-08-11 DIAGNOSIS — Z95828 Presence of other vascular implants and grafts: Secondary | ICD-10-CM

## 2023-08-11 LAB — CMP (CANCER CENTER ONLY)
ALT: 9 U/L (ref 0–44)
AST: 10 U/L — ABNORMAL LOW (ref 15–41)
Albumin: 4.2 g/dL (ref 3.5–5.0)
Alkaline Phosphatase: 64 U/L (ref 38–126)
Anion gap: 5 (ref 5–15)
BUN: 26 mg/dL — ABNORMAL HIGH (ref 8–23)
CO2: 26 mmol/L (ref 22–32)
Calcium: 8.9 mg/dL (ref 8.9–10.3)
Chloride: 106 mmol/L (ref 98–111)
Creatinine: 1.33 mg/dL — ABNORMAL HIGH (ref 0.44–1.00)
GFR, Estimated: 41 mL/min — ABNORMAL LOW (ref >60)
Glucose, Bld: 127 mg/dL — ABNORMAL HIGH (ref 70–99)
Potassium: 4.6 mmol/L (ref 3.5–5.1)
Sodium: 137 mmol/L (ref 135–145)
Total Bilirubin: 0.3 mg/dL (ref 0.0–1.2)
Total Protein: 7.7 g/dL (ref 6.5–8.1)

## 2023-08-11 LAB — CBC WITH DIFFERENTIAL (CANCER CENTER ONLY)
Abs Immature Granulocytes: 0.01 10*3/uL (ref 0.00–0.07)
Basophils Absolute: 0 10*3/uL (ref 0.0–0.1)
Basophils Relative: 1 %
Eosinophils Absolute: 0.2 10*3/uL (ref 0.0–0.5)
Eosinophils Relative: 6 %
HCT: 34.7 % — ABNORMAL LOW (ref 36.0–46.0)
Hemoglobin: 11.2 g/dL — ABNORMAL LOW (ref 12.0–15.0)
Immature Granulocytes: 0 %
Lymphocytes Relative: 30 %
Lymphs Abs: 1.3 10*3/uL (ref 0.7–4.0)
MCH: 27.3 pg (ref 26.0–34.0)
MCHC: 32.3 g/dL (ref 30.0–36.0)
MCV: 84.6 fL (ref 80.0–100.0)
Monocytes Absolute: 0.3 10*3/uL (ref 0.1–1.0)
Monocytes Relative: 7 %
Neutro Abs: 2.4 10*3/uL (ref 1.7–7.7)
Neutrophils Relative %: 56 %
Platelet Count: 209 10*3/uL (ref 150–400)
RBC: 4.1 MIL/uL (ref 3.87–5.11)
RDW: 14 % (ref 11.5–15.5)
WBC Count: 4.3 10*3/uL (ref 4.0–10.5)
nRBC: 0 % (ref 0.0–0.2)

## 2023-08-11 MED ORDER — IOHEXOL 300 MG/ML  SOLN
60.0000 mL | Freq: Once | INTRAMUSCULAR | Status: AC | PRN
  Administered 2023-08-11: 60 mL via INTRAVENOUS

## 2023-08-11 MED ORDER — HEPARIN SOD (PORK) LOCK FLUSH 100 UNIT/ML IV SOLN
500.0000 [IU] | Freq: Once | INTRAVENOUS | Status: AC
  Administered 2023-08-11: 500 [IU] via INTRAVENOUS

## 2023-08-11 MED ORDER — SODIUM CHLORIDE (PF) 0.9 % IJ SOLN
INTRAMUSCULAR | Status: AC
  Filled 2023-08-11: qty 50

## 2023-08-11 MED ORDER — HEPARIN SOD (PORK) LOCK FLUSH 100 UNIT/ML IV SOLN
INTRAVENOUS | Status: AC
  Filled 2023-08-11: qty 5

## 2023-08-11 MED ORDER — SODIUM CHLORIDE 0.9% FLUSH
10.0000 mL | Freq: Once | INTRAVENOUS | Status: AC
Start: 2023-08-11 — End: 2023-08-11
  Administered 2023-08-11: 10 mL

## 2023-08-19 ENCOUNTER — Inpatient Hospital Stay (HOSPITAL_BASED_OUTPATIENT_CLINIC_OR_DEPARTMENT_OTHER): Payer: Medicare Other | Admitting: Internal Medicine

## 2023-08-19 ENCOUNTER — Other Ambulatory Visit: Payer: Self-pay | Admitting: Internal Medicine

## 2023-08-19 ENCOUNTER — Other Ambulatory Visit: Payer: Self-pay | Admitting: Adult Health

## 2023-08-19 VITALS — BP 130/91 | HR 89 | Temp 97.7°F | Resp 18 | Wt 183.1 lb

## 2023-08-19 DIAGNOSIS — C3412 Malignant neoplasm of upper lobe, left bronchus or lung: Secondary | ICD-10-CM | POA: Diagnosis not present

## 2023-08-19 DIAGNOSIS — C3490 Malignant neoplasm of unspecified part of unspecified bronchus or lung: Secondary | ICD-10-CM

## 2023-08-19 DIAGNOSIS — I2699 Other pulmonary embolism without acute cor pulmonale: Secondary | ICD-10-CM

## 2023-08-19 NOTE — Progress Notes (Signed)
 [facility] [phone]   [phone]  OFFICE PROGRESS NOTE  Yolanda Aldo, NP [address] [place] [address]  DIAGNOSIS:  1) Stage IIIb (T1a, N3, M0) non-small cell lung cancer, adenosquamous carcinoma presented with left upper lobe pulmonary nodule in addition to left hilar, mediastinal and left supraclavicular and cervical lymphadenopathy diagnosed in September 2023. 2) incidental pulmonary embolism of segmental to subsegmental nonocclusive pulmonary embolism in the peripheral right lower lobe diagnosed in November 2023.  Detected Alteration(s) / Biomarker(s) Associated FDA-approved therapies Clinical Trial Availability % cfDNA or Amplification  TP53 [id number] None Yes 1.6%  NFE2L2 D29V None Yes 1.2%  TP53 V216M None Yes 0.4%  TMB 18.02 mut/Mb  PDL1 Expression: 80%  PRIOR THERAPY:  1) A course of concurrent chemoradiation with weekly carboplatin for AUC of 2 and paclitaxel 45 Mg/M2.  First dose March 11, 2022.  Status post 7 cycles of treatment last dose was given in April 15, 2022. 2) Consolidation treatment with immunotherapy with Imfinzi 1500 mg IV every 4 weeks.  First dose May 09, 2022.  Status post 2 cycles.  Treatment discontinued secondary to suspicious immunotherapy mediated pneumonitis.  CURRENT THERAPY:  1) Eliquis 5 mg p.o. twice daily.  INTERVAL HISTORY: Yolanda Robertson 78 y.o. female is to the clinic today for follow-up visit accompanied by her husband.Discussed the use of AI scribe software for clinical note transcription with the patient, who gave verbal consent to proceed.  History of Present Illness   Yolanda Robertson is a 78 year old female with stage 3B non-small cell lung cancer who presents for evaluation with repeat CT scan for restaging of her disease. She is accompanied by her husband.  Diagnosed with stage 3B non-small cell lung cancer, adenosquamous carcinoma, in September 2023, she  underwent concurrent chemoradiation with weekly carboplatin and paclitaxel, followed by immunotherapy with durvalumab. She is currently undergoing a repeat CT scan of the chest for restaging purposes.  Since her last visit several months ago, she has been doing well overall with no new complaints. She experiences a mild cough but denies chest pain or shortness of breath, except on exertion. She also denies nausea, vomiting, diarrhea, or significant weight loss, noting a slight weight gain of about one pound.  Her past medical history includes an incidental pulmonary embolism in November 2023, with no current symptoms related to this condition.      Yolanda Robertson  MEDICAL HISTORY: Past Medical History:  Diagnosis Date   Hyperlipidemia    Hypertension    Lung cancer (HCC) 01/25/2022   Lung mass    left upper lobe    ALLERGIES:  is allergic to no known allergies.  MEDICATIONS:  Current Outpatient Medications  Medication Sig Dispense Refill   albuterol (PROVENTIL) (2.5 MG/3ML) 0.083% nebulizer solution Take 3 mLs (2.5 mg total) by nebulization every 6 (six) hours as needed for wheezing or shortness of breath. 75 mL 12   amoxicillin-clavulanate (AUGMENTIN) 875-125 MG tablet Take 1 tablet by mouth 2 (two) times daily. 14 tablet 0   EDARBI 80 MG TABS Take 1 tablet by mouth daily.     ELIQUIS 5 MG TABS tablet Take 1 tablet by mouth twice daily 60 tablet 0   ibuprofen (ADVIL) 200 MG tablet Take 200 mg by mouth every 8 (eight) hours as needed (pain.).     indapamide (LOZOL) 1.25 MG tablet Take 1.25 mg by mouth in the morning.     ipratropium (ATROVENT) 0.02 %  nebulizer solution INHALE 1 VIAL IN NEBULIZER TWICE DAILY 150 mL 0   lidocaine (XYLOCAINE) 2 % solution Patient: Mix 1part 2% viscous lidocaine, 1part H20. Swallow 10mL of diluted mixture, before meals and at bedtime, up to QID prn soreness 200 mL 3   lidocaine-prilocaine (EMLA) cream Apply 1 Application topically as needed. 30 g 2    methocarbamol (ROBAXIN) 500 MG tablet Take 500 mg by mouth every 8 (eight) hours as needed.     Polyvinyl Alcohol-Povidone PF (REFRESH) 1.4-0.6 % SOLN Place 1-2 drops into both eyes 3 (three) times daily as needed (dry/irritated eyes.).     predniSONE (DELTASONE) 20 MG tablet Take 1 tablet (20 mg total) by mouth daily with breakfast. 5 tablet 0   rosuvastatin (CRESTOR) 10 MG tablet Take 10 mg by mouth in the morning.     Vitamin D, Ergocalciferol, (DRISDOL) 1.25 MG (50000 UNIT) CAPS capsule Take 50,000 Units by mouth once a week.     No current facility-administered medications for this visit.    SURGICAL HISTORY:  Past Surgical History:  Procedure Laterality Date   FINE NEEDLE ASPIRATION  02/12/2022   Procedure: FINE NEEDLE ASPIRATION (FNA) LINEAR;  Surgeon: Josephine Igo, DO;  Location: [facility];  Service: Pulmonary;;   IR IMAGING GUIDED PORT INSERTION  05/28/2022   VAGINAL HYSTERECTOMY     VIDEO BRONCHOSCOPY WITH ENDOBRONCHIAL ULTRASOUND Left 02/12/2022   Procedure: VIDEO BRONCHOSCOPY WITH ENDOBRONCHIAL ULTRASOUND;  Surgeon: Josephine Igo, DO;  Location: [facility];  Service: Pulmonary;  Laterality: Left;    REVIEW OF SYSTEMS:  A comprehensive review of systems was negative except for: Respiratory: positive for cough and dyspnea on exertion   PHYSICAL EXAMINATION: General appearance: alert, cooperative, and no distress Head: Normocephalic, without obvious abnormality, atraumatic Neck: no adenopathy, no JVD, supple, symmetrical, trachea midline, and thyroid not enlarged, symmetric, no tenderness/mass/nodules Lymph nodes: Cervical, supraclavicular, and axillary nodes normal. Resp: clear to auscultation bilaterally Back: symmetric, no curvature. ROM normal. No CVA tenderness. Cardio: regular rate and rhythm, S1, S2 normal, no murmur, click, rub or gallop GI: soft, non-tender; bowel sounds normal; no masses,  no organomegaly Extremities: extremities normal, atraumatic, no  cyanosis or edema  ECOG PERFORMANCE STATUS: 1 - Symptomatic but completely ambulatory  Blood pressure (!) 130/91, pulse 89, temperature 97.7 F (36.5 C), temperature source Temporal, resp. rate 18, weight 183 lb 1.6 oz (83.1 kg), SpO2 97%.  LABORATORY DATA: Lab Results  Component Value Date   WBC 4.3 08/11/2023   HGB 11.2 (L) 08/11/2023   HCT 34.7 (L) 08/11/2023   MCV 84.6 08/11/2023   PLT 209 08/11/2023      Chemistry      Component Value Date/Time   NA 137 08/11/2023 1128   K 4.6 08/11/2023 1128   CL 106 08/11/2023 1128   CO2 26 08/11/2023 1128   BUN 26 (H) 08/11/2023 1128   CREATININE 1.33 (H) 08/11/2023 1128      Component Value Date/Time   CALCIUM 8.9 08/11/2023 1128   ALKPHOS 64 08/11/2023 1128   AST 10 (L) 08/11/2023 1128   ALT 9 08/11/2023 1128   BILITOT 0.3 08/11/2023 1128       RADIOGRAPHIC STUDIES: CT Chest W Contrast Result Date: 08/19/2023 CLINICAL DATA:  Restaging non-small cell lung cancer. Chemotherapy and radiation therapy completed. Cough with shortness of breath on exertion. * Tracking Code: BO * EXAM: CT CHEST WITH CONTRAST TECHNIQUE: Multidetector CT imaging of the chest was performed during intravenous contrast administration. RADIATION  DOSE REDUCTION: This exam was performed according to the departmental dose-optimization program which includes automated exposure control, adjustment of the mA and/or kV according to patient size and/or use of iterative reconstruction technique. CONTRAST:  60mL OMNIPAQUE IOHEXOL 300 MG/ML  SOLN COMPARISON:  Chest CT 02/11/2023, 11/04/2022 and 08/15/2022 FINDINGS: Cardiovascular: No acute vascular findings. Atherosclerosis of the aorta, great vessels and coronary arteries. Right IJ Port-A-Cath extends to the upper right atrium. The heart size is normal. No significant pericardial effusion. Mediastinum/Nodes: There are no enlarged mediastinal, hilar or axillary lymph nodes.Unchanged small mediastinal lymph nodes. The thyroid  gland, trachea and esophagus demonstrate no significant findings. Lungs/Pleura: No pleural effusion or pneumothorax. Stable radiation changes in the left perihilar region with associated traction bronchiectasis, architectural distortion and volume loss. Unchanged small residual subpleural right upper lobe nodule measuring 4 mm on image 38/8. No new or enlarging pulmonary nodules. Upper abdomen: Unchanged right adrenal nodule measuring 3.2 x 2.7 cm on image 132/2, previously characterized as an adenoma. No acute findings are seen in the visualized upper abdomen. Musculoskeletal/Chest wall: There is no chest wall mass or suspicious osseous finding. Stable mild spondylosis. IMPRESSION: 1. Stable radiation changes in the left perihilar region. No evidence of local recurrence or metastatic disease. 2. Unchanged small residual subpleural right upper lobe nodule, likely postinflammatory. 3. Unchanged right adrenal adenoma. 4.  Aortic Atherosclerosis (ICD10-I70.0). Electronically Signed   By: Carey Bullocks M.D.   On: 08/19/2023 10:41   MM 3D SCREENING MAMMOGRAM BILATERAL BREAST Result Date: 08/04/2023 CLINICAL DATA:  Screening. EXAM: DIGITAL SCREENING BILATERAL MAMMOGRAM WITH TOMOSYNTHESIS AND CAD TECHNIQUE: Bilateral screening digital craniocaudal and mediolateral oblique mammograms were obtained. Bilateral screening digital breast tomosynthesis was performed. The images were evaluated with computer-aided detection. COMPARISON:  Previous exam(s). ACR Breast Density Category c: The breasts are heterogeneously dense, which may obscure small masses. FINDINGS: There are no findings suspicious for malignancy. IMPRESSION: No mammographic evidence of malignancy. A result letter of this screening mammogram will be mailed directly to the patient. RECOMMENDATION: Screening mammogram in one year. (Code:SM-B-01Y) BI-RADS CATEGORY  1: Negative. Electronically Signed   By: Emmaline Kluver M.D.   On: 08/04/2023 09:50     ASSESSMENT AND PLAN: This is a very pleasant 78 years old African-American female with Stage IIIb (T1a, N3, M0) non-small cell lung cancer, adenosquamous carcinoma presented with left upper lobe pulmonary nodule in addition to left hilar, mediastinal and left supraclavicular and cervical lymphadenopathy diagnosed in September 2023. Molecular studies showed no actionable mutations and PD-L1 expression is 80%. The patient also had bronchoscopy by Dr. Tonia Brooms and the final pathology was consistent with adenosquamous carcinoma from the left upper lobe lung nodule. 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 The patient underwent a course of concurrent chemoradiation with weekly carboplatin for AUC of 2 and paclitaxel 45 Mg/M2.  First dose March 11, 2022.  Status post 7 cycles the last dose was given April 15, 2022. The patient underwent consolidation treatment with immunotherapy with Imfinzi 1500 Mg IV every 4 weeks status post 2 cycles.  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 She is currently on observation and feeling fine. She had repeat CT scan of the chest performed recently.  I personally independently reviewed the scan and discussed the results with the patient and her husband.  Her scan showed no concerning findings for disease recurrence or metastasis.    Stage III B non-small  cell lung cancer, adenosquamous carcinoma Diagnosed in September 2023. Underwent concurrent chemoradiation with weekly carboplatin and paclitaxel, followed by durvalumab immunotherapy. Recent CT scan shows well-managed disease with no evidence of local recurrence or metastasis. Radiation changes persist in the left hilar region. She reports feeling well with no significant new symptoms. Follow-up every six months is planned based on disease status and symptom absence. - Continue  follow-up with CT scans every six months - Schedule next CT scan approximately ten days before the next follow-up appointment  Pulmonary embolism Occurred in November 2023. No current symptoms or complications related to this condition were discussed.    For the anemia, the patient will continue on oral ferrous sulfate with vitamin C. For the history of pulmonary embolism, she is currently on Eliquis 5 mg p.o. twice daily. She was advised to call immediately if she has any concerning symptoms in the interval. The patient voices understanding of current disease status and treatment options and is in agreement with the current care plan.  All questions were answered. The patient knows to call the clinic with any problems, questions or concerns. We can certainly see the patient much sooner if necessary.  The total time spent in the appointment was 20 minutes.  Disclaimer: This note was dictated with voice recognition software. Similar sounding words can inadvertently be transcribed and may not be corrected upon review.

## 2023-09-16 ENCOUNTER — Other Ambulatory Visit: Payer: Self-pay | Admitting: Adult Health

## 2023-09-18 ENCOUNTER — Other Ambulatory Visit: Payer: Self-pay | Admitting: Internal Medicine

## 2023-09-18 DIAGNOSIS — I2699 Other pulmonary embolism without acute cor pulmonale: Secondary | ICD-10-CM

## 2023-09-26 ENCOUNTER — Telehealth: Payer: Self-pay | Admitting: Medical Oncology

## 2023-09-26 NOTE — Telephone Encounter (Signed)
 Faxed clearance.

## 2023-09-29 ENCOUNTER — Encounter: Payer: Self-pay | Admitting: Internal Medicine

## 2023-10-13 ENCOUNTER — Inpatient Hospital Stay: Payer: Medicare Other | Attending: Internal Medicine

## 2023-10-13 VITALS — BP 122/74 | HR 92 | Temp 98.7°F | Resp 17

## 2023-10-13 DIAGNOSIS — Z452 Encounter for adjustment and management of vascular access device: Secondary | ICD-10-CM | POA: Diagnosis not present

## 2023-10-13 DIAGNOSIS — Z95828 Presence of other vascular implants and grafts: Secondary | ICD-10-CM

## 2023-10-13 DIAGNOSIS — C3412 Malignant neoplasm of upper lobe, left bronchus or lung: Secondary | ICD-10-CM | POA: Diagnosis present

## 2023-10-13 MED ORDER — SODIUM CHLORIDE 0.9% FLUSH
10.0000 mL | Freq: Once | INTRAVENOUS | Status: AC
  Administered 2023-10-13: 10 mL

## 2023-10-13 MED ORDER — HEPARIN SOD (PORK) LOCK FLUSH 100 UNIT/ML IV SOLN
500.0000 [IU] | Freq: Once | INTRAVENOUS | Status: AC
  Administered 2023-10-13: 500 [IU]

## 2023-10-14 ENCOUNTER — Other Ambulatory Visit: Payer: Self-pay | Admitting: Adult Health

## 2023-10-20 ENCOUNTER — Other Ambulatory Visit: Payer: Self-pay | Admitting: Internal Medicine

## 2023-10-20 DIAGNOSIS — I2699 Other pulmonary embolism without acute cor pulmonale: Secondary | ICD-10-CM

## 2023-11-10 ENCOUNTER — Other Ambulatory Visit: Payer: Self-pay | Admitting: Adult Health

## 2023-11-18 ENCOUNTER — Other Ambulatory Visit: Payer: Self-pay | Admitting: Physician Assistant

## 2023-11-18 DIAGNOSIS — I2699 Other pulmonary embolism without acute cor pulmonale: Secondary | ICD-10-CM

## 2023-12-08 ENCOUNTER — Other Ambulatory Visit: Payer: Self-pay | Admitting: Adult Health

## 2023-12-16 ENCOUNTER — Inpatient Hospital Stay: Payer: Medicare Other | Attending: Internal Medicine

## 2023-12-16 DIAGNOSIS — C3412 Malignant neoplasm of upper lobe, left bronchus or lung: Secondary | ICD-10-CM | POA: Insufficient documentation

## 2023-12-16 DIAGNOSIS — Z95828 Presence of other vascular implants and grafts: Secondary | ICD-10-CM

## 2023-12-16 DIAGNOSIS — Z452 Encounter for adjustment and management of vascular access device: Secondary | ICD-10-CM | POA: Insufficient documentation

## 2023-12-16 MED ORDER — HEPARIN SOD (PORK) LOCK FLUSH 100 UNIT/ML IV SOLN
500.0000 [IU] | Freq: Once | INTRAVENOUS | Status: AC
  Administered 2023-12-16: 500 [IU]

## 2023-12-16 MED ORDER — SODIUM CHLORIDE 0.9% FLUSH
10.0000 mL | Freq: Once | INTRAVENOUS | Status: AC
Start: 2023-12-16 — End: 2023-12-16
  Administered 2023-12-16: 10 mL

## 2023-12-22 ENCOUNTER — Other Ambulatory Visit: Payer: Self-pay | Admitting: Physician Assistant

## 2023-12-22 DIAGNOSIS — I2699 Other pulmonary embolism without acute cor pulmonale: Secondary | ICD-10-CM

## 2023-12-31 ENCOUNTER — Other Ambulatory Visit: Payer: Self-pay | Admitting: Adult Health

## 2024-01-20 ENCOUNTER — Other Ambulatory Visit: Payer: Self-pay | Admitting: Physician Assistant

## 2024-01-20 DIAGNOSIS — I2699 Other pulmonary embolism without acute cor pulmonale: Secondary | ICD-10-CM

## 2024-02-09 ENCOUNTER — Other Ambulatory Visit

## 2024-02-09 ENCOUNTER — Inpatient Hospital Stay: Attending: Internal Medicine

## 2024-02-09 ENCOUNTER — Ambulatory Visit (HOSPITAL_COMMUNITY)
Admission: RE | Admit: 2024-02-09 | Discharge: 2024-02-09 | Disposition: A | Discharge status: Home / Self Care | Source: Ambulatory Visit | Attending: Internal Medicine | Admitting: Internal Medicine

## 2024-02-09 DIAGNOSIS — Z79899 Other long term (current) drug therapy: Secondary | ICD-10-CM | POA: Insufficient documentation

## 2024-02-09 DIAGNOSIS — Z923 Personal history of irradiation: Secondary | ICD-10-CM | POA: Diagnosis not present

## 2024-02-09 DIAGNOSIS — Z7901 Long term (current) use of anticoagulants: Secondary | ICD-10-CM | POA: Insufficient documentation

## 2024-02-09 DIAGNOSIS — Z86711 Personal history of pulmonary embolism: Secondary | ICD-10-CM | POA: Diagnosis not present

## 2024-02-09 DIAGNOSIS — Z9226 Personal history of immune checkpoint inhibitor therapy: Secondary | ICD-10-CM | POA: Insufficient documentation

## 2024-02-09 DIAGNOSIS — Z90710 Acquired absence of both cervix and uterus: Secondary | ICD-10-CM | POA: Diagnosis not present

## 2024-02-09 DIAGNOSIS — E785 Hyperlipidemia, unspecified: Secondary | ICD-10-CM | POA: Diagnosis not present

## 2024-02-09 DIAGNOSIS — C3490 Malignant neoplasm of unspecified part of unspecified bronchus or lung: Secondary | ICD-10-CM | POA: Insufficient documentation

## 2024-02-09 DIAGNOSIS — I10 Essential (primary) hypertension: Secondary | ICD-10-CM | POA: Diagnosis not present

## 2024-02-09 DIAGNOSIS — C3412 Malignant neoplasm of upper lobe, left bronchus or lung: Secondary | ICD-10-CM | POA: Diagnosis present

## 2024-02-09 DIAGNOSIS — Z7952 Long term (current) use of systemic steroids: Secondary | ICD-10-CM | POA: Diagnosis not present

## 2024-02-09 DIAGNOSIS — Z85118 Personal history of other malignant neoplasm of bronchus and lung: Secondary | ICD-10-CM | POA: Diagnosis not present

## 2024-02-09 LAB — CBC WITH DIFFERENTIAL (CANCER CENTER ONLY)
Abs Immature Granulocytes: 0.01 K/uL (ref 0.00–0.07)
Basophils Absolute: 0 K/uL (ref 0.0–0.1)
Basophils Relative: 1 %
Eosinophils Absolute: 0.1 K/uL (ref 0.0–0.5)
Eosinophils Relative: 4 %
HCT: 33.3 % — ABNORMAL LOW (ref 36.0–46.0)
Hemoglobin: 11 g/dL — ABNORMAL LOW (ref 12.0–15.0)
Immature Granulocytes: 0 %
Lymphocytes Relative: 36 %
Lymphs Abs: 1.3 K/uL (ref 0.7–4.0)
MCH: 27.8 pg (ref 26.0–34.0)
MCHC: 33 g/dL (ref 30.0–36.0)
MCV: 84.3 fL (ref 80.0–100.0)
Monocytes Absolute: 0.4 K/uL (ref 0.1–1.0)
Monocytes Relative: 10 %
Neutro Abs: 1.8 K/uL (ref 1.7–7.7)
Neutrophils Relative %: 49 %
Platelet Count: 200 K/uL (ref 150–400)
RBC: 3.95 MIL/uL (ref 3.87–5.11)
RDW: 13.7 % (ref 11.5–15.5)
WBC Count: 3.6 K/uL — ABNORMAL LOW (ref 4.0–10.5)
nRBC: 0 % (ref 0.0–0.2)

## 2024-02-09 LAB — CMP (CANCER CENTER ONLY)
ALT: 11 U/L (ref 0–44)
AST: 11 U/L — ABNORMAL LOW (ref 15–41)
Albumin: 4.3 g/dL (ref 3.5–5.0)
Alkaline Phosphatase: 63 U/L (ref 38–126)
Anion gap: 5 (ref 5–15)
BUN: 25 mg/dL — ABNORMAL HIGH (ref 8–23)
CO2: 26 mmol/L (ref 22–32)
Calcium: 9.1 mg/dL (ref 8.9–10.3)
Chloride: 105 mmol/L (ref 98–111)
Creatinine: 1.32 mg/dL — ABNORMAL HIGH (ref 0.44–1.00)
GFR, Estimated: 42 mL/min — ABNORMAL LOW (ref >60)
Glucose, Bld: 103 mg/dL — ABNORMAL HIGH (ref 70–99)
Potassium: 4.5 mmol/L (ref 3.5–5.1)
Sodium: 136 mmol/L (ref 135–145)
Total Bilirubin: 0.3 mg/dL (ref 0.0–1.2)
Total Protein: 7.4 g/dL (ref 6.5–8.1)

## 2024-02-09 MED ORDER — IOHEXOL 300 MG/ML  SOLN
60.0000 mL | Freq: Once | INTRAMUSCULAR | Status: AC | PRN
  Administered 2024-02-09: 60 mL via INTRAVENOUS

## 2024-02-09 MED ORDER — HEPARIN SOD (PORK) LOCK FLUSH 100 UNIT/ML IV SOLN
500.0000 [IU] | Freq: Once | INTRAVENOUS | Status: AC
  Administered 2024-02-09: 500 [IU] via INTRAVENOUS

## 2024-02-18 ENCOUNTER — Telehealth: Payer: Self-pay | Admitting: Internal Medicine

## 2024-02-18 ENCOUNTER — Inpatient Hospital Stay (HOSPITAL_BASED_OUTPATIENT_CLINIC_OR_DEPARTMENT_OTHER): Admitting: Internal Medicine

## 2024-02-18 VITALS — BP 126/80 | HR 85 | Temp 96.0°F | Resp 17 | Ht 64.0 in | Wt 191.3 lb

## 2024-02-18 DIAGNOSIS — C3490 Malignant neoplasm of unspecified part of unspecified bronchus or lung: Secondary | ICD-10-CM

## 2024-02-18 DIAGNOSIS — C3412 Malignant neoplasm of upper lobe, left bronchus or lung: Secondary | ICD-10-CM | POA: Diagnosis not present

## 2024-02-18 NOTE — Telephone Encounter (Signed)
 Scheduled patient for 6 month return. Called and left voicemail with appointment details.

## 2024-02-18 NOTE — Progress Notes (Signed)
 [facility] [phone]   [phone]  OFFICE PROGRESS NOTE  Campbell Reynolds, NP [address] [place] [address]  DIAGNOSIS:  1) Stage IIIb (T1a, N3, M0) non-small cell lung cancer, adenosquamous carcinoma presented with left upper lobe pulmonary nodule in addition to left hilar, mediastinal and left supraclavicular and cervical lymphadenopathy diagnosed in September 2023. 2) incidental pulmonary embolism of segmental to subsegmental nonocclusive pulmonary embolism in the peripheral right lower lobe diagnosed in November 2023.  Detected Alteration(s) / Biomarker(s) Associated FDA-approved therapies Clinical Trial Availability % cfDNA or Amplification  TP53 [id number] None Yes 1.6%  NFE2L2 D29V None Yes 1.2%  TP53 V216M None Yes 0.4%  TMB 18.02 mut/Mb  PDL1 Expression: 80%  PRIOR THERAPY:  1) A course of concurrent chemoradiation with weekly carboplatin  for AUC of 2 and paclitaxel  45 Mg/M2.  First dose March 11, 2022.  Status post 7 cycles of treatment last dose was given in April 15, 2022. 2) Consolidation treatment with immunotherapy with Imfinzi  1500 mg IV every 4 weeks.  First dose May 09, 2022.  Status post 2 cycles.  Treatment discontinued secondary to suspicious immunotherapy mediated pneumonitis.  CURRENT THERAPY:  1) Eliquis  5 mg p.o. twice daily.  INTERVAL HISTORY: Yolanda Robertson 78 y.o. female is to the clinic today for follow-up visit accompanied by her husband.Discussed the use of AI scribe software for clinical note transcription with the patient, who gave verbal consent to proceed.  History of Present Illness   Yolanda Robertson is a 78 year old female with stage 3B non-small cell lung cancer who presents for evaluation and repeat CT scan for restaging of her disease. She is accompanied by her husband.  Diagnosed with stage 3B non-small cell lung cancer, adenosquamous carcinoma, in September 2023, with no  actionable mutation but a PD-L1 expression of 80%. Underwent concurrent chemoradiation with weekly carboplatin  and paclitaxel , followed by two cycles of consolidation treatment with durvalumab . 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  Occasional shortness of breath, particularly upon exertion. No chest pain, cough, hemoptysis, recent weight loss, nausea, vomiting, diarrhea, or headaches.  On Eliquis  since completing chemotherapy and radiation, initiated after a CT scan revealed a pulmonary embolism. Reports swelling in her right foot but no pain. Concerned about the cost of Eliquis  and inquires about the duration of its use.      MEDICAL HISTORY: Past Medical History:  Diagnosis Date   Hyperlipidemia    Hypertension    Lung cancer (HCC) 01/25/2022   Lung mass    left upper lobe    ALLERGIES:  is allergic to no known allergies.  MEDICATIONS:  Current Outpatient Medications  Medication Sig Dispense Refill   albuterol  (PROVENTIL ) (2.5 MG/3ML) 0.083% nebulizer solution Take 3 mLs (2.5 mg total) by nebulization every 6 (six) hours as needed for wheezing or shortness of breath. 75 mL 12   amoxicillin -clavulanate (AUGMENTIN ) 875-125 MG tablet Take 1 tablet by mouth 2 (two) times daily. 14 tablet 0   EDARBI 80 MG TABS Take 1 tablet by mouth daily.     ELIQUIS  5 MG TABS tablet Take 1 tablet by mouth twice daily 60 tablet 0   ibuprofen (ADVIL) 200 MG tablet Take 200 mg by mouth every 8 (eight) hours as needed (pain.).     indapamide (LOZOL) 1.25 MG tablet Take 1.25 mg by mouth in the morning.     ipratropium (  ATROVENT ) 0.02 % nebulizer solution INHALE 1 VIAL IN NEBULIZER TWICE DAILY 75 mL 5   lidocaine  (XYLOCAINE ) 2 % solution Patient: Mix 1part 2% viscous lidocaine , 1part H20. Swallow 10mL of diluted mixture, before meals and at bedtime, up to QID prn soreness 200 mL 3    lidocaine -prilocaine  (EMLA ) cream Apply 1 Application topically as needed. 30 g 2   methocarbamol (ROBAXIN) 500 MG tablet Take 500 mg by mouth every 8 (eight) hours as needed.     Polyvinyl Alcohol-Povidone PF (REFRESH) 1.4-0.6 % SOLN Place 1-2 drops into both eyes 3 (three) times daily as needed (dry/irritated eyes.).     predniSONE  (DELTASONE ) 20 MG tablet Take 1 tablet (20 mg total) by mouth daily with breakfast. 5 tablet 0   rosuvastatin (CRESTOR) 10 MG tablet Take 10 mg by mouth in the morning.     Vitamin D, Ergocalciferol, (DRISDOL) 1.25 MG (50000 UNIT) CAPS capsule Take 50,000 Units by mouth once a week.     No current facility-administered medications for this visit.    SURGICAL HISTORY:  Past Surgical History:  Procedure Laterality Date   FINE NEEDLE ASPIRATION  02/12/2022   Procedure: FINE NEEDLE ASPIRATION (FNA) LINEAR;  Surgeon: Brenna Adine CROME, DO;  Location: [facility];  Service: Pulmonary;;   IR IMAGING GUIDED PORT INSERTION  05/28/2022   VAGINAL HYSTERECTOMY     VIDEO BRONCHOSCOPY WITH ENDOBRONCHIAL ULTRASOUND Left 02/12/2022   Procedure: VIDEO BRONCHOSCOPY WITH ENDOBRONCHIAL ULTRASOUND;  Surgeon: Brenna Adine CROME, DO;  Location: [facility];  Service: Pulmonary;  Laterality: Left;    REVIEW OF SYSTEMS:  Constitutional: positive for fatigue Eyes: negative Ears, nose, mouth, throat, and face: negative Respiratory: positive for dyspnea on exertion Cardiovascular: negative Gastrointestinal: negative Genitourinary:negative Integument/breast: negative Hematologic/lymphatic: negative Musculoskeletal:negative Neurological: negative Behavioral/Psych: negative Endocrine: negative Allergic/Immunologic: negative   PHYSICAL EXAMINATION: General appearance: alert, cooperative, and no distress Head: Normocephalic, without obvious abnormality, atraumatic Neck: no adenopathy, no JVD, supple, symmetrical, trachea midline, and thyroid  not enlarged, symmetric, no  tenderness/mass/nodules Lymph nodes: Cervical, supraclavicular, and axillary nodes normal. Resp: clear to auscultation bilaterally Back: symmetric, no curvature. ROM normal. No CVA tenderness. Cardio: regular rate and rhythm, S1, S2 normal, no murmur, click, rub or gallop GI: soft, non-tender; bowel sounds normal; no masses,  no organomegaly Extremities: extremities normal, atraumatic, no cyanosis or edema Neurologic: Alert and oriented X 3, normal strength and tone. Normal symmetric reflexes. Normal coordination and gait  ECOG PERFORMANCE STATUS: 1 - Symptomatic but completely ambulatory  Blood pressure 126/80, pulse 85, temperature (!) 96 F (35.6 C), resp. rate 17, height 5' 4 (1.626 m), weight 191 lb 4.8 oz (86.8 kg), SpO2 96%.  LABORATORY DATA: Lab Results  Component Value Date   WBC 3.6 (L) 02/09/2024   HGB 11.0 (L) 02/09/2024   HCT 33.3 (L) 02/09/2024   MCV 84.3 02/09/2024   PLT 200 02/09/2024      Chemistry      Component Value Date/Time   NA 136 02/09/2024 0915   K 4.5 02/09/2024 0915   CL 105 02/09/2024 0915   CO2 26 02/09/2024 0915   BUN 25 (H) 02/09/2024 0915   CREATININE 1.32 (H) 02/09/2024 0915      Component Value Date/Time   CALCIUM 9.1 02/09/2024 0915   ALKPHOS 63 02/09/2024 0915   AST 11 (L) 02/09/2024 0915   ALT 11 02/09/2024 0915   BILITOT 0.3 02/09/2024 0915       RADIOGRAPHIC STUDIES: CT Chest W Contrast Result Date: 02/12/2024 CLINICAL  DATA:  Non-small cell lung cancer, staging. * Tracking Code: BO * EXAM: CT CHEST WITH CONTRAST TECHNIQUE: Multidetector CT imaging of the chest was performed during intravenous contrast administration. RADIATION DOSE REDUCTION: This exam was performed according to the departmental dose-optimization program which includes automated exposure control, adjustment of the mA and/or kV according to patient size and/or use of iterative reconstruction technique. CONTRAST:  60mL OMNIPAQUE  IOHEXOL  300 MG/ML  SOLN COMPARISON:   Multiple priors including CT August 11, 2023 FINDINGS: Cardiovascular: Accessed right chest Port-A-Cath with tip in the right atrium. Aortic atherosclerosis. Normal size heart. Small pericardial effusion similar to prior. Mediastinum/Nodes: Stable prominent mediastinal lymph nodes. Similar soft tissue thickening along the left hilum. Gas fluid levels in a patulous esophagus. Lungs/Pleura: Similar masslike consolidation in the perihilar left lung with a associated architectural distortion, volume loss and bronchiectasis/bronchiolectasis. Stable 4 mm right upper lobe pulmonary nodule on image 38/6. Scattered atelectasis/scarring. No pleural effusion. No pneumothorax. Upper Abdomen: Stable right adrenal nodule previously characterized as an adenoma. Left adrenal gland appears normal. Musculoskeletal: No aggressive lytic or blastic lesion of bone. Thoracic spondylosis with bridging anterior vertebral osteophytes. IMPRESSION: 1. Similar masslike consolidation in the perihilar left lung with associated architectural distortion, volume loss and bronchiectasis/bronchiolectasis, compatible with postradiation change. No evidence of local recurrence. 2. Stable 4 mm right upper lobe pulmonary nodule. 3. Stable prominent mediastinal lymph nodes. 4. Gas fluid levels in a patulous esophagus, suggestive of gastroesophageal reflux. 5. Stable right adrenal nodule previously characterized as an adenoma. 6. Aortic atherosclerosis. Aortic Atherosclerosis (ICD10-I70.0). Electronically Signed   By: Reyes Holder M.D.   On: 02/12/2024 14:26    ASSESSMENT AND PLAN: This is a very pleasant 78 years old African-American female with Stage IIIb (T1a, N3, M0) non-small cell lung cancer, adenosquamous carcinoma presented with left upper lobe pulmonary nodule in addition to left hilar, mediastinal and left supraclavicular and cervical lymphadenopathy diagnosed in September 2023. Molecular studies showed no actionable mutations and PD-L1  expression is 80%. The patient also had bronchoscopy by Dr. Brenna and the final pathology was consistent with adenosquamous carcinoma from the left upper lobe lung nodule. 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 The patient underwent a course of concurrent chemoradiation with weekly carboplatin  for AUC of 2 and paclitaxel  45 Mg/M2.  First dose March 11, 2022.  Status post 7 cycles the last dose was given April 15, 2022. The patient underwent consolidation treatment with immunotherapy with Imfinzi  1500 Mg IV every 4 weeks status post 2 cycles.  Her treatment was discontinued secondary to suspicious immunotherapy mediated pneumonitis.  She has been in observation for for the last 18 months. She had repeat CT scan of the chest performed recently.  I personally and independently reviewed the scan and discussed the result with the patient and her husband.  Her scan showed no concerning findings for disease progression. Assessment and Plan    Stage 3B non-small cell lung cancer, adenosquamous carcinoma Initially diagnosed in September 2023 with PD-L1 expression of 80%. Treated with concurrent chemoradiation and two cycles of durvalumab , discontinued due to suspected immunotherapy-mediated pneumonitis. Currently no evidence of disease progression on recent CT scan. A stable 4 mm right upper lobe nodule is present, but no new growths are observed. - Continue surveillance with CT scans every six months for five years - Instruct to report any new symptoms such as hemoptysis or worsening dyspnea  Pulmonary embolism post-chemoradiation  Currently on Eliquis  for over a year. Discussed discontinuation of Eliquis  given stable cancer status and duration of anticoagulation therapy. However, due to swelling in the right foot, continuation of Eliquis  is advised until further evaluation by pulmonology. - Continue  Eliquis  - Discuss anticoagulation therapy with pulmonologist during next visit in November - Monitor for any side effects or symptoms such as swelling  Shortness of breath on exertion Intermittent shortness of breath on exertion without associated chest pain, cough, or hemoptysis. Weight gain noted, no recent weight loss. Continues to follow up with pulmonology for respiratory symptoms. - Continue follow-up with pulmonologist - Instruct to report any worsening of symptoms  She was advised to call immediately if she has any other concerning symptoms in the interval.  The patient voices understanding of current disease status and treatment options and is in agreement with the current care plan.  All questions were answered. The patient knows to call the clinic with any problems, questions or concerns. We can certainly see the patient much sooner if necessary.  The total time spent in the appointment was 30 minutes.  Disclaimer: This note was dictated with voice recognition software. Similar sounding words can inadvertently be transcribed and may not be corrected upon review.

## 2024-02-23 ENCOUNTER — Other Ambulatory Visit: Payer: Self-pay | Admitting: Physician Assistant

## 2024-02-23 DIAGNOSIS — I2699 Other pulmonary embolism without acute cor pulmonale: Secondary | ICD-10-CM

## 2024-02-27 ENCOUNTER — Ambulatory Visit (INDEPENDENT_AMBULATORY_CARE_PROVIDER_SITE_OTHER): Admitting: Adult Health

## 2024-02-27 ENCOUNTER — Encounter: Payer: Self-pay | Admitting: Adult Health

## 2024-02-27 VITALS — BP 111/76 | HR 75 | Temp 97.8°F | Ht 64.0 in | Wt 192.4 lb

## 2024-02-27 DIAGNOSIS — R911 Solitary pulmonary nodule: Secondary | ICD-10-CM

## 2024-02-27 DIAGNOSIS — J479 Bronchiectasis, uncomplicated: Secondary | ICD-10-CM

## 2024-02-27 DIAGNOSIS — C3492 Malignant neoplasm of unspecified part of left bronchus or lung: Secondary | ICD-10-CM | POA: Diagnosis not present

## 2024-02-27 DIAGNOSIS — M79604 Pain in right leg: Secondary | ICD-10-CM | POA: Diagnosis not present

## 2024-02-27 DIAGNOSIS — J432 Centrilobular emphysema: Secondary | ICD-10-CM

## 2024-02-27 NOTE — Patient Instructions (Addendum)
 May use albuterol  and ipratropium nebulizer once or  twice daily Albuterol  inhaler or nebulizer as needed Use flutter valve twice daily after nebulizer Activity as tolerated Continue on Eliquis  twice daily for now - will decide after venous doppler if we can stop.  Venous doppler of right leg.  Do not use any NSAIDs -ibuprofen, aleve , excedrin, motrin, etc.  Continue follow-up with oncology with serial CT imaging Follow up with Dr.  Zaida or Vinessa Macconnell NP in 6 months and As needed  (30 min slot)  Please contact office for sooner follow up if symptoms do not improve or worsen or seek emergency care

## 2024-02-27 NOTE — Progress Notes (Signed)
 @Patient  ID: Beula MARLA Slade, female    DOB: [birth year], 78 y.o.   MRN: [id number]  Chief Complaint  Patient presents with   Medical Management of Chronic Issues    72mo f/u    Referring provider: Campbell Reynolds, NP  HPI: 78 year old female former smoker followed for stage III non-small cell lung cancer diagnosed in September 2023 with left lung mass-completed chemo radiation.  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  Radiation induced bronchiectasis Incidental pulmonary embolism found November 2023 during active chemo treatment-started on Eliquis  therapy Has emphysema   TEST/EVENTS :  Oncology notes :  course of concurrent chemoradiation with weekly carboplatin  for AUC of 2 and paclitaxel  45 Mg/M2.  First dose March 11, 2022.  Status post 7 cycles of treatment last dose was given in April 15, 2022. 2) Consolidation treatment with immunotherapy with Imfinzi  1500 mg IV every 4 weeks.  First dose May 09, 2022.  Status post 2 cycles.  Treatment discontinued secondary to suspicious immunotherapy mediated pneumonitis. Molecular studies showed no actionable mutations and PD-L1 expression is 80%.  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   MRI brain February 09, 2022-normal with no evidence of metastatic disease     CT chest April 19, 2022 spiculated left upper lobe nodule almost completely resolved, significant interval decrease in previously seen left hilar lymph node, findings are consistent with treatment response of  primary lung malignancy and nodal metastatic disease, positive PE peripheral right lower lobe   CT chest August 15, 2022 consolidative disease in the left upper lobe and superior segment of the left lower lobe similar to June 24, 2022, 12 mm right upper lobe nodule new, stable scattered mediastinal lymph nodes     CT chest November 04, 2022 slightly decreased subpleural irregular right upper lobe lung nodule measuring 1.1 x 0.7 previously 1.2 x 0.9, evolution of postradiation changes in the left lung with slightly increased consolidation, volume loss and bronchiectasis, decreased diffuse interstitial opacities   CT chest February 11, 2023 stable radiation changes in the left lung.  No evidence of local recurrence or metastatic disease, further improvement in the right upper lobe nodule   CT chest February 09, 2024 similar masslike consolidation in the perihilar left lung with associated architectural distortion, volume loss and bronchiectasis, stable 4 mm right upper lobe nodule   ultrasound-guided core biopsy of a left cervical lymph node and the final pathology was consistent with benign Warthin tumor.   July 02, 2023 that showed moderate restriction with an FEV1 at 67%, ratio 80, FVC 62%, no significant bronchodilator response, DLCO 56%.     Discussed the use of AI scribe software for clinical note transcription with the patient, who gave verbal consent to proceed.  History of Present Illness DAEJAH KLEBBA is a 78 year old female with stage three non-small cell lung cancer who presents for a pulmonary checkup to assess her breathing.  She was diagnosed with stage three non-small cell lung cancer in September 2023 and has been under oncology care with regular surveillance CT imaging. She completed chemotherapy and radiation, and immunotherapy with Imfinzi  was initiated  but discontinued after two cycles due to suspected immunotherapy-mediated pneumonitis. Her last CT chest on  February 09, 2024, showed stable mass-like consolidation in the perihilar left lung with architectural distortion and bronchiectasis, and a stable four-millimeter right upper lobe nodule.  Her breathing is generally good, but she experiences dyspnea during activities such as walking through large stores or doing household chores. She uses a nebulizer with albuterol  and ipratropium (DuoNeb) twice daily, which she finds affordable. She had previously tried inhalers but found them too expensive. She experienced a sore throat from the nebulizer  She was found to have an incidental pulmonary embolism in November 2023 during active chemotherapy and has been on Eliquis  5 mg twice daily since then. No prior history of blood clots and no family history of blood clots. She is currently active and independent, moving around at home.  She reports swelling in her right leg, which has been ongoing for a while. She has seen her primary care provider for this issue, who advised wrapping the leg. She denies any pain in the leg but describes it as feeling tight in calf and ankles . She has a history of arthritis in spine  and has received injections for back pain. No hemoptysis  No allergy symptoms and no high blood pressure. She is on Edarbi for blood pressure management. Former smoker, active and independent at home.     Allergies  Allergen Reactions   No Known Allergies     Immunization History  Administered Date(s) Administered   Fluad Quad(high Dose 65+) 02/19/2023   Fluad Trivalent(High Dose 65+) 01/27/2023   Influenza-Unspecified 02/24/2022   PFIZER(Purple Top)SARS-COV-2 Vaccination 07/29/2019, 08/24/2019   Pfizer(Comirnaty)Fall Seasonal Vaccine 12 years and older 02/19/2023    Past Medical History:  Diagnosis Date   Hyperlipidemia    Hypertension    Lung cancer (HCC) 01/25/2022   Lung mass    left upper lobe    Tobacco History: Social History   Tobacco Use  Smoking Status Former    Current packs/day: 0.00   Types: Cigarettes   Quit date: 12/23/2021   Years since quitting: 2.1   Passive exposure: Past  Smokeless Tobacco Never   Counseling given: Not Answered   Outpatient Medications Prior to Visit  Medication Sig Dispense Refill   albuterol  (PROVENTIL ) (2.5 MG/3ML) 0.083% nebulizer solution Take 3 mLs (2.5 mg total) by nebulization every 6 (six) hours as needed for wheezing or shortness of breath. 75 mL 12   amoxicillin -clavulanate (AUGMENTIN ) 875-125 MG tablet Take 1 tablet by mouth 2 (two) times daily. 14 tablet 0   EDARBI 80 MG TABS Take 1 tablet by mouth daily.     ELIQUIS  5 MG TABS tablet Take 1 tablet by mouth twice daily 60 tablet 0   ibuprofen (ADVIL) 200 MG tablet Take 200 mg by mouth every 8 (eight) hours as needed (pain.).     indapamide (LOZOL) 1.25 MG tablet Take 1.25 mg by mouth in the morning.     ipratropium (ATROVENT ) 0.02 % nebulizer solution INHALE 1 VIAL IN NEBULIZER TWICE DAILY 75 mL 5   lidocaine  (XYLOCAINE ) 2 % solution Patient: Mix 1part 2% viscous lidocaine , 1part H20. Swallow 10mL of diluted mixture, before meals and at bedtime, up to QID prn soreness 200 mL 3   lidocaine -prilocaine  (EMLA ) cream Apply 1 Application topically as needed. 30 g 2   methocarbamol (ROBAXIN) 500 MG tablet Take 500 mg by mouth every 8 (eight) hours as needed.     Polyvinyl Alcohol-Povidone  PF (REFRESH) 1.4-0.6 % SOLN Place 1-2 drops into both eyes 3 (three) times daily as needed (dry/irritated eyes.).     predniSONE  (DELTASONE ) 20 MG tablet Take 1 tablet (20 mg total) by mouth daily with breakfast. 5 tablet 0   rosuvastatin (CRESTOR) 10 MG tablet Take 10 mg by mouth in the morning.     Vitamin D, Ergocalciferol, (DRISDOL) 1.25 MG (50000 UNIT) CAPS capsule Take 50,000 Units by mouth once a week.     No facility-administered medications prior to visit.     Review of Systems:   Constitutional:   No  weight loss, night sweats,  Fevers, chills, +fatigue, or   lassitude.  HEENT:   No headaches,  Difficulty swallowing,  Tooth/dental problems, or  Sore throat,                No sneezing, itching, ear ache, nasal congestion, post nasal drip,   CV:  No chest pain,  Orthopnea, PND, swelling in lower extremities, anasarca, dizziness, palpitations, syncope.   GI  No heartburn, indigestion, abdominal pain, nausea, vomiting, diarrhea, change in bowel habits, loss of appetite, bloody stools.   Resp:   No chest wall deformity  Skin: no rash or lesions.  GU: no dysuria, change in color of urine, no urgency or frequency.  No flank pain, no hematuria   MS:  No joint pain or swelling.  No decreased range of motion.  No back pain.    Physical Exam  BP 111/76   Pulse 75   Temp 97.8 F (36.6 C) (Oral)   Ht 5' 4 (1.626 m)   Wt 192 lb 6.4 oz (87.3 kg)   SpO2 97%   BMI 33.03 kg/m   GEN: A/Ox3; pleasant , NAD, well nourished    HEENT:  [place]/AT,  NOSE-clear, THROAT-clear, no lesions, no postnasal drip or exudate noted.   NECK:  Supple w/ fair ROM; no JVD; normal carotid impulses w/o bruits; no thyromegaly or nodules palpated; no lymphadenopathy.    RESP  Clear  P & A; w/o, wheezes/ rales/ or rhonchi. no accessory muscle use, no dullness to percussion  CARD:  RRR, no m/r/g, no peripheral edema, pulses intact, no cyanosis or clubbing. Neg homans sign. Tr-1 edema on right ankle  GI:   Soft & nt; nml bowel sounds; no organomegaly or masses detected.   Musco: Warm bil, no deformities or joint swelling noted.   Neuro: alert, no focal deficits noted.    Skin: Warm, no lesions or rashes    Lab Results:  CBC    Component Value Date/Time   WBC 3.6 (L) 02/09/2024 0915   WBC 5.3 02/20/2022 1101   RBC 3.95 02/09/2024 0915   HGB 11.0 (L) 02/09/2024 0915   HCT 33.3 (L) 02/09/2024 0915   PLT 200 02/09/2024 0915   MCV 84.3 02/09/2024 0915   MCH 27.8 02/09/2024 0915   MCHC 33.0 02/09/2024 0915   RDW 13.7 02/09/2024 0915   LYMPHSABS 1.3 02/09/2024  0915   MONOABS 0.4 02/09/2024 0915   EOSABS 0.1 02/09/2024 0915   BASOSABS 0.0 02/09/2024 0915    BMET    Component Value Date/Time   NA 136 02/09/2024 0915   K 4.5 02/09/2024 0915   CL 105 02/09/2024 0915   CO2 26 02/09/2024 0915   GLUCOSE 103 (H) 02/09/2024 0915   BUN 25 (H) 02/09/2024 0915   CREATININE 1.32 (H) 02/09/2024 0915   CALCIUM 9.1 02/09/2024 0915   GFRNONAA 42 (L) 02/09/2024 0915  BNP No results found for: BNP  ProBNP No results found for: PROBNP  Imaging: CT Chest W Contrast Result Date: 02/12/2024 CLINICAL DATA:  Non-small cell lung cancer, staging. * Tracking Code: BO * EXAM: CT CHEST WITH CONTRAST TECHNIQUE: Multidetector CT imaging of the chest was performed during intravenous contrast administration. RADIATION DOSE REDUCTION: This exam was performed according to the departmental dose-optimization program which includes automated exposure control, adjustment of the mA and/or kV according to patient size and/or use of iterative reconstruction technique. CONTRAST:  60mL OMNIPAQUE  IOHEXOL  300 MG/ML  SOLN COMPARISON:  Multiple priors including CT August 11, 2023 FINDINGS: Cardiovascular: Accessed right chest Port-A-Cath with tip in the right atrium. Aortic atherosclerosis. Normal size heart. Small pericardial effusion similar to prior. Mediastinum/Nodes: Stable prominent mediastinal lymph nodes. Similar soft tissue thickening along the left hilum. Gas fluid levels in a patulous esophagus. Lungs/Pleura: Similar masslike consolidation in the perihilar left lung with a associated architectural distortion, volume loss and bronchiectasis/bronchiolectasis. Stable 4 mm right upper lobe pulmonary nodule on image 38/6. Scattered atelectasis/scarring. No pleural effusion. No pneumothorax. Upper Abdomen: Stable right adrenal nodule previously characterized as an adenoma. Left adrenal gland appears normal. Musculoskeletal: No aggressive lytic or blastic lesion of bone. Thoracic  spondylosis with bridging anterior vertebral osteophytes. IMPRESSION: 1. Similar masslike consolidation in the perihilar left lung with associated architectural distortion, volume loss and bronchiectasis/bronchiolectasis, compatible with postradiation change. No evidence of local recurrence. 2. Stable 4 mm right upper lobe pulmonary nodule. 3. Stable prominent mediastinal lymph nodes. 4. Gas fluid levels in a patulous esophagus, suggestive of gastroesophageal reflux. 5. Stable right adrenal nodule previously characterized as an adenoma. 6. Aortic atherosclerosis. Aortic Atherosclerosis (ICD10-I70.0). Electronically Signed   By: Reyes Holder M.D.   On: 02/12/2024 14:26    Administration History     None          Latest Ref Rng & Units 07/02/2023   12:52 PM  PFT Results  FVC-Pre L 1.68   FVC-Predicted Pre % 61   FVC-Post L 1.72   FVC-Predicted Post % 62   Pre FEV1/FVC % % 81   Post FEV1/FCV % % 80   FEV1-Pre L 1.37   FEV1-Predicted Pre % 66   FEV1-Post L 1.38   DLCO uncorrected ml/min/mmHg 10.69   DLCO UNC% % 56   DLCO corrected ml/min/mmHg 10.69   DLCO COR %Predicted % 56   DLVA Predicted % 96   TLC L 4.29   TLC % Predicted % 84   RV % Predicted % 108     No results found for: NITRICOXIDE      Assessment & Plan:   Assessment and Plan Assessment & Plan Stage III non-small cell lung cancer, left lung-no evidence of recurrence surveillance CT Stage III non-small cell lung cancer in the left lung . The last CT chest on February 09, 2024, showed stable mass-like consolidation in the perihilar left lung with architectural distortion and bronchiectasis. Scarring remains unchanged. Continue surveillance with oncology every six months for five years and repeat CT chest in six months.  Chronic left lung scarring and bronchiectasis post-radiation with moderate pulmonary restriction and emphysema   Chronic left lung scarring and bronchiectasis post-radiation result in moderate  pulmonary restriction and emphysema. Previous pulmonary function testing in February 2025 showed moderate restriction. She uses a nebulizer with albuterol  and ipratropium (DuoNeb) twice daily, experiencing a sore throat likely due to medication. Continue nebulizer use twice daily, advise brushing, rinsing, and gargling after use to prevent  sore throat, and use a flutter valve after nebulizer to help clear mucus.  Chronic shortness of breath due to lung disease   Chronic shortness of breath due to lung disease is exacerbated by physical activity such as walking or household chores, related to lung scarring and emphysema. Encourage regular physical activity and rest as needed. Continue nebulizer therapy as prescribed.  Right upper lobe pulmonary nodule   A 4 mm right upper lobe pulmonary nodule shows no changes on recent imaging.  Provoked pulmonary embolism, on anticoagulation, evaluating for discontinuation   Provoked pulmonary embolism occurred during active cancer treatment in November 2023. She is currently on Eliquis  5 mg twice daily.. No family history of blood clots. Discuss risks and benefits of discontinuation, including potential for recurrence if another clot occurs. Decision to discontinue will depend on Doppler results. Order venous Doppler ultrasound of the right leg to evaluate for DVT. Consider discontinuation of Eliquis  if Doppler is normal.    Right lower extremity swelling, evaluating for venous insufficiency and deep vein thrombosis   Right lower extremity swelling is possibly due to venous insufficiency. Differential includes deep vein thrombosis, although unlikely due to current anticoagulation. Swelling may be related to age-related venous insufficiency. Order venous Doppler ultrasound of the right leg to evaluate for DVT. Advise on supportive measures such as wearing support stockings, a low salt diet, and leg elevation.  Plan  Patient Instructions  May use albuterol  and  ipratropium nebulizer once or  twice daily Albuterol  inhaler or nebulizer as needed Use flutter valve twice daily after nebulizer Activity as tolerated Continue on Eliquis  twice daily for now - will decide after venous doppler if we can stop.  Venous doppler of right leg.  Do not use any NSAIDs -ibuprofen, aleve , excedrin, motrin, etc.  Continue follow-up with oncology with serial CT imaging Follow up with Dr.  Zaida or Mel Tadros NP in 6 months and As needed  (30 min slot)  Please contact office for sooner follow up if symptoms do not improve or worsen or seek emergency care           Madelin Stank, NP 02/27/2024

## 2024-03-05 ENCOUNTER — Ambulatory Visit (HOSPITAL_COMMUNITY)
Admission: RE | Admit: 2024-03-05 | Discharge: 2024-03-05 | Disposition: A | Discharge status: Home / Self Care | Source: Ambulatory Visit | Attending: Adult Health | Admitting: Adult Health

## 2024-03-05 ENCOUNTER — Ambulatory Visit: Payer: Self-pay | Admitting: Adult Health

## 2024-03-05 DIAGNOSIS — M79604 Pain in right leg: Secondary | ICD-10-CM | POA: Diagnosis present

## 2024-03-08 NOTE — Progress Notes (Signed)
 Called and spoke to pt - advised of US  results per [facility]. Pt verbalized understanding, NFN

## 2024-03-23 ENCOUNTER — Other Ambulatory Visit: Payer: Self-pay | Admitting: Physician Assistant

## 2024-03-23 DIAGNOSIS — I2699 Other pulmonary embolism without acute cor pulmonale: Secondary | ICD-10-CM

## 2024-04-26 ENCOUNTER — Other Ambulatory Visit: Payer: Self-pay | Admitting: Physician Assistant

## 2024-04-26 DIAGNOSIS — I2699 Other pulmonary embolism without acute cor pulmonale: Secondary | ICD-10-CM

## 2024-04-29 ENCOUNTER — Telehealth: Payer: Self-pay

## 2024-04-29 NOTE — Telephone Encounter (Signed)
 Pt called stating she needs a refill on her Eliquis .  Per Cassie, PA call for pulmonary recommendation.  Left voicemail for pulmonary to return call with recommendation.

## 2024-04-30 ENCOUNTER — Ambulatory Visit: Payer: Self-pay | Admitting: Adult Health

## 2024-04-30 ENCOUNTER — Telehealth: Payer: Self-pay

## 2024-04-30 NOTE — Telephone Encounter (Signed)
 Copied from CRM #[id number]. Topic: Clinical - Prescription Issue >> Apr 29, 2024  2:42 PM Benton O wrote: Reason for CRM: daina at [facility] the patient called our office to request a refill for eliquis  . they were going to wait and see what pulmonology recommend . trying to figure out do they want herb to stay on the eliquis  or is she decreasing or is she being taking off. Did read the after visit summary concerning eliquis  and told them what tammy parrett np recommended ms daina says that the Venous doppler was negative so now they are needing for Ms tammy to give them a call to see what she wants to do about the eliquis  now. Patient has taken her last dose today so its urgent   [phone] ms daina   Tried to reach out LT message per today's note from Ms. Hope okay to discontinue

## 2024-04-30 NOTE — Telephone Encounter (Signed)
 FYI Only or Action Required?: PT is wondering if she is to continue Eliquis , if so please send in prescription.  Patient is followed in Pulmonology for , last seen on 02/27/2024 by Parrett, Madelin RAMAN, NP.  Called Nurse Triage reporting Medication Refill.  Symptoms began na.  Interventions attempted: Nothing.  Symptoms are: stable.  Triage Disposition: Call PCP Now  Patient/caregiver understands and will follow disposition?: Yes - PT would like a call back regarding refill status.                    E2C2 Pulmonary Triage - Initial Assessment Questions "Chief Complaint (e.g., cough, sob, wheezing, fever, chills, sweat or additional symptoms) *Go to specific symptom protocol after initial questions. no  "How long have symptoms been present?" na  Have you tested for COVID or Flu? Note: If not, ask patient if a home test can be taken. If so, instruct patient to call back for positive results. No  MEDICINES:   "Have you used any OTC meds to help with symptoms?" No If yes, ask "What medications?" na  "Have you used your inhalers/maintenance medication?" No If yes, "What medications?" na  If inhaler, ask "How many puffs and how often?" Note: Review instructions on medication in the chart. na  OXYGEN: "Do you wear supplemental oxygen?" No If yes, "How many liters are you supposed to use?" na           Copied from CRM #[id number]. Topic: Clinical - Red Word Triage >> Apr 30, 2024 10:48 AM Corean SAUNDERS wrote: Red Word that prompted transfer to Nurse Triage: Out of ELIQUIS  Reason for Disposition  [1] Prescription refill request for ESSENTIAL medicine (i.e., likelihood of harm to patient if not taken) AND [2] triager unable to refill per department policy  Answer Assessment - Initial Assessment Questions 1. DRUG NAME: What medicine do you need to have refilled?     Eliquis  2. REFILLS REMAINING: How many refills are remaining? Notes: The label on the  medicine or pill bottle will show how many refills are remaining. If there are no refills remaining, then a renewal may be needed.     none  4. PRESCRIBER: Who prescribed it? Note: The prescribing doctor or group is responsible for refill approvals..     Tammy Parrett 5. PHARMACY: Have you contacted your pharmacy (drugstore)? Note: Some pharmacies will contact the doctor (or NP/PA).      Yes - pharmacy is waiting for response from Pulm 6. SYMPTOMS: Do you have any symptoms?     no  Protocols used: Medication Refill and Renewal Call-A-AH

## 2024-04-30 NOTE — Telephone Encounter (Signed)
 Patient aware.NFN

## 2024-04-30 NOTE — Telephone Encounter (Signed)
 Venous doppler showed no evidence of DVT/blood clot  She can discontinue Eliquis   Cc: Tammy

## 2024-05-01 NOTE — Telephone Encounter (Signed)
 Yes can discontinue Eliquis . Doppler is negative.

## 2024-06-23 ENCOUNTER — Other Ambulatory Visit: Payer: Self-pay | Admitting: Student

## 2024-06-23 DIAGNOSIS — Z1231 Encounter for screening mammogram for malignant neoplasm of breast: Secondary | ICD-10-CM

## 2024-06-25 ENCOUNTER — Other Ambulatory Visit: Payer: Self-pay | Admitting: Physician Assistant

## 2024-06-25 DIAGNOSIS — I2699 Other pulmonary embolism without acute cor pulmonale: Secondary | ICD-10-CM

## 2024-06-25 MED ORDER — APIXABAN 5 MG PO TABS: 5.0000 mg | ORAL_TABLET | Freq: Two times a day (BID) | ORAL | 0 refills | Status: DC

## 2024-06-25 NOTE — Telephone Encounter (Signed)
 Patient no longer needs Eliquis .  Doppler negative.

## 2024-07-01 ENCOUNTER — Telehealth: Payer: Self-pay | Admitting: Medical Oncology

## 2024-07-01 NOTE — Telephone Encounter (Signed)
 LVM at [facility] pharmacy to discontinue eloquis. Pt notified.  She asked who will pay for the rx that her husband picked up yesterday. I told her to return med to pharmacy and discuss with pharmacy.

## 2024-07-01 NOTE — Telephone Encounter (Signed)
 Eloquis refill question - Why was it refilled? She said pulmonary discontinued it. Pt has not taken Eloquis since dec .

## 2024-07-30 ENCOUNTER — Ambulatory Visit

## 2024-08-09 ENCOUNTER — Inpatient Hospital Stay

## 2024-08-17 ENCOUNTER — Inpatient Hospital Stay: Admitting: Internal Medicine

## 2024-08-19 ENCOUNTER — Ambulatory Visit: Admitting: Adult Health
